# Patient Record
Sex: Male | Born: 1970
Health system: Southern US, Community
[De-identification: ages and names within clinical notes are randomized; demographics above are authoritative.]

## PROBLEM LIST (undated history)

## (undated) DIAGNOSIS — E785 Hyperlipidemia, unspecified: Secondary | ICD-10-CM

## (undated) DIAGNOSIS — Z9889 Other specified postprocedural states: Secondary | ICD-10-CM

## (undated) DIAGNOSIS — K219 Gastro-esophageal reflux disease without esophagitis: Secondary | ICD-10-CM

## (undated) DIAGNOSIS — I2699 Other pulmonary embolism without acute cor pulmonale: Secondary | ICD-10-CM

## (undated) DIAGNOSIS — R112 Nausea with vomiting, unspecified: Secondary | ICD-10-CM

## (undated) DIAGNOSIS — N529 Male erectile dysfunction, unspecified: Secondary | ICD-10-CM

## (undated) HISTORY — PX: SHOULDER SURGERY: SHX246

## (undated) HISTORY — PX: BACK SURGERY: SHX140

## (undated) HISTORY — PX: SPINE SURGERY: SHX786

## (undated) HISTORY — PX: OTHER SURGICAL HISTORY: SHX169

## (undated) HISTORY — DX: Gastro-esophageal reflux disease without esophagitis: K21.9

---

## 2004-10-03 ENCOUNTER — Emergency Department (HOSPITAL_COMMUNITY): Admission: EM | Admit: 2004-10-03 | Discharge: 2004-10-03 | Payer: Self-pay | Admitting: Emergency Medicine

## 2004-10-10 ENCOUNTER — Ambulatory Visit (HOSPITAL_COMMUNITY): Admission: RE | Admit: 2004-10-10 | Discharge: 2004-10-10 | Payer: Self-pay | Admitting: Orthopedic Surgery

## 2007-09-23 ENCOUNTER — Emergency Department (HOSPITAL_COMMUNITY): Admission: EM | Admit: 2007-09-23 | Discharge: 2007-09-23 | Payer: Self-pay | Admitting: Emergency Medicine

## 2008-11-21 ENCOUNTER — Observation Stay (HOSPITAL_COMMUNITY): Admission: EM | Admit: 2008-11-21 | Discharge: 2008-11-22 | Payer: Self-pay | Admitting: Emergency Medicine

## 2010-07-07 LAB — CBC
HCT: 42.6 % (ref 39.0–52.0)
Hemoglobin: 15.1 g/dL (ref 13.0–17.0)
Platelets: 199 10*3/uL (ref 150–400)

## 2010-07-07 LAB — RAPID URINE DRUG SCREEN, HOSP PERFORMED
Amphetamines: NOT DETECTED
Barbiturates: NOT DETECTED
Benzodiazepines: NOT DETECTED
Cocaine: NOT DETECTED
Opiates: NOT DETECTED
Tetrahydrocannabinol: NOT DETECTED

## 2010-07-07 LAB — BASIC METABOLIC PANEL
CO2: 22 mEq/L (ref 19–32)
Calcium: 8.5 mg/dL (ref 8.4–10.5)
Chloride: 107 mEq/L (ref 96–112)
Creatinine, Ser: 0.95 mg/dL (ref 0.4–1.5)
GFR calc Af Amer: >60 mL/min (ref >60)
Glucose, Bld: 126 mg/dL — ABNORMAL HIGH (ref 70–99)
Potassium: 3.5 mEq/L (ref 3.5–5.1)

## 2010-07-07 LAB — URINALYSIS, ROUTINE W REFLEX MICROSCOPIC
Glucose, UA: NEGATIVE mg/dL
Ketones, ur: NEGATIVE mg/dL
pH: 7 (ref 5.0–8.0)

## 2010-07-07 LAB — URINE MICROSCOPIC-ADD ON
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
## 2012-09-28 ENCOUNTER — Encounter: Payer: Self-pay | Admitting: Emergency Medicine

## 2012-09-28 ENCOUNTER — Emergency Department (INDEPENDENT_AMBULATORY_CARE_PROVIDER_SITE_OTHER): Payer: 59

## 2012-09-28 ENCOUNTER — Emergency Department
Admission: EM | Admit: 2012-09-28 | Discharge: 2012-09-28 | Disposition: A | Discharge status: Home / Self Care | Payer: Self-pay | Source: Home / Self Care | Attending: Family Medicine | Admitting: Family Medicine

## 2012-09-28 DIAGNOSIS — R109 Unspecified abdominal pain: Secondary | ICD-10-CM

## 2012-09-28 DIAGNOSIS — R1030 Lower abdominal pain, unspecified: Secondary | ICD-10-CM

## 2012-09-28 DIAGNOSIS — S7000XA Contusion of unspecified hip, initial encounter: Secondary | ICD-10-CM

## 2012-09-28 DIAGNOSIS — M79609 Pain in unspecified limb: Secondary | ICD-10-CM
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
## 2013-09-12 ENCOUNTER — Encounter: Payer: Self-pay | Admitting: Emergency Medicine

## 2013-09-12 ENCOUNTER — Emergency Department (INDEPENDENT_AMBULATORY_CARE_PROVIDER_SITE_OTHER): Payer: 59

## 2013-09-12 ENCOUNTER — Emergency Department
Admission: EM | Admit: 2013-09-12 | Discharge: 2013-09-12 | Disposition: A | Discharge status: Home / Self Care | Payer: 59 | Source: Home / Self Care | Attending: Emergency Medicine | Admitting: Emergency Medicine

## 2013-09-12 ENCOUNTER — Emergency Department: Payer: 59

## 2013-09-12 DIAGNOSIS — S92109A Unspecified fracture of unspecified talus, initial encounter for closed fracture: Secondary | ICD-10-CM

## 2013-09-12 DIAGNOSIS — R609 Edema, unspecified: Secondary | ICD-10-CM

## 2013-09-12 DIAGNOSIS — M25579 Pain in unspecified ankle and joints of unspecified foot: Secondary | ICD-10-CM

## 2013-09-12 DIAGNOSIS — S92152A Displaced avulsion fracture (chip fracture) of left talus, initial encounter for closed fracture: Secondary | ICD-10-CM

## 2013-09-12 MED ORDER — HYDROCODONE-ACETAMINOPHEN 5-325 MG PO TABS: 1.0000 | ORAL_TABLET | ORAL | Status: DC | PRN

## 2013-09-12 MED ORDER — IBUPROFEN 200 MG PO TABS: ORAL_TABLET | ORAL | Status: DC
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
## 2013-09-14 ENCOUNTER — Encounter: Payer: Self-pay | Admitting: Sports Medicine

## 2013-09-14 ENCOUNTER — Ambulatory Visit (INDEPENDENT_AMBULATORY_CARE_PROVIDER_SITE_OTHER): Payer: 59 | Admitting: Sports Medicine

## 2013-09-14 VITALS — BP 137/78 | HR 61 | Ht 70.0 in | Wt 171.0 lb

## 2013-09-14 DIAGNOSIS — Z299 Encounter for prophylactic measures, unspecified: Secondary | ICD-10-CM

## 2013-09-14 DIAGNOSIS — M25572 Pain in left ankle and joints of left foot: Secondary | ICD-10-CM | POA: Insufficient documentation

## 2013-09-14 DIAGNOSIS — N139 Obstructive and reflux uropathy, unspecified: Secondary | ICD-10-CM | POA: Insufficient documentation

## 2013-09-14 DIAGNOSIS — S92102A Unspecified fracture of left talus, initial encounter for closed fracture: Secondary | ICD-10-CM

## 2013-09-14 DIAGNOSIS — S92109A Unspecified fracture of unspecified talus, initial encounter for closed fracture: Secondary | ICD-10-CM

## 2013-09-14 DIAGNOSIS — Z0000 Encounter for general adult medical examination without abnormal findings: Secondary | ICD-10-CM | POA: Insufficient documentation

## 2013-09-14 MED ORDER — TAMSULOSIN HCL 0.4 MG PO CAPS: 0.4000 mg | ORAL_CAPSULE | Freq: Every day | ORAL | Status: DC

## 2013-09-14 MED ORDER — HYDROCODONE-ACETAMINOPHEN 5-325 MG PO TABS: 1.0000 | ORAL_TABLET | Freq: Three times a day (TID) | ORAL | Status: DC | PRN

## 2013-09-14 NOTE — Assessment & Plan Note (Addendum)
With hemarthrosis. Continue boot, return in one week for a solid cast. Hydrocodone for pain.  I billed a fracture code for this visit, all subsequent visits for this complaint will be "post-op checks" in the global period.

## 2013-09-14 NOTE — Assessment & Plan Note (Signed)
Patient will return for complete physical, ordering fasting blood work.
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
## 2013-09-14 NOTE — Assessment & Plan Note (Signed)
Flomax at bedtime.

## 2013-09-21 ENCOUNTER — Encounter: Payer: Self-pay | Admitting: Sports Medicine

## 2013-09-21 ENCOUNTER — Ambulatory Visit (INDEPENDENT_AMBULATORY_CARE_PROVIDER_SITE_OTHER): Payer: 59 | Admitting: Sports Medicine

## 2013-09-21 VITALS — BP 129/85 | HR 65 | Ht 70.0 in | Wt 170.0 lb

## 2013-09-21 DIAGNOSIS — S92102D Unspecified fracture of left talus, subsequent encounter for fracture with routine healing: Secondary | ICD-10-CM

## 2013-09-21 DIAGNOSIS — S93332A Other subluxation of left foot, initial encounter: Secondary | ICD-10-CM | POA: Insufficient documentation

## 2013-09-21 DIAGNOSIS — S9306XA Dislocation of unspecified ankle joint, initial encounter: Secondary | ICD-10-CM

## 2013-09-21 DIAGNOSIS — S9305XA Dislocation of left ankle joint, initial encounter: Secondary | ICD-10-CM | POA: Insufficient documentation

## 2013-09-21 DIAGNOSIS — IMO0001 Reserved for inherently not codable concepts without codable children: Secondary | ICD-10-CM

## 2013-09-21 NOTE — Assessment & Plan Note (Signed)
Cast immobilization for 4 weeks. 

## 2013-09-21 NOTE — Progress Notes (Signed)
  Subjective:    CC: Cast placement  HPI: Fracture left talus: Overall doing significantly better in a cast boot, he's here for traditional cast placement. He has noted some popping around his lateral malleolus. He did some research and thinks this is peroneal tendon subluxation, I agree. Pain is mild, improving. He also has some FMLA forms that he would like filled out.  Past medical history, Surgical history, Family history not pertinant except as noted below, Social history, Allergies, and medications have been entered into the medical record, reviewed, and no changes needed.   Review of Systems: No fevers, chills, night sweats, weight loss, chest pain, or shortness of breath.   Objective:    General: Well Developed, well nourished, and in no acute distress.  Neuro: Alert and oriented x3, extra-ocular muscles intact, sensation grossly intact.  HEENT: Normocephalic, atraumatic, pupils equal round reactive to light, neck supple, no masses, no lymphadenopathy, thyroid nonpalpable.  Skin: Warm and dry, no rashes. Cardiac: Regular rate and rhythm, no murmurs rubs or gallops, no lower extremity edema.  Respiratory: Clear to auscultation bilaterally. Not using accessory muscles, speaking in full sentences. Left foot: Swollen, mild fluid wave, tender to palpation over the peroneals. I am unable to reproduce subluxation.  Short leg cast placed  FMLA paperwork filled out.  Impression and Recommendations:   I spent 40 minutes with this patient, greater than 50% was face-to-face time counseling regarding the above diagnoses and filling out paperwork.

## 2013-09-21 NOTE — Assessment & Plan Note (Signed)
Cast immobilization for 4 weeks.

## 2013-09-25 LAB — COMPREHENSIVE METABOLIC PANEL WITH GFR
ALT: 25 U/L (ref 0–53)
AST: 19 U/L (ref 0–37)
Albumin: 4.1 g/dL (ref 3.5–5.2)
Alkaline Phosphatase: 48 U/L (ref 39–117)
CO2: 26 meq/L (ref 19–32)
Calcium: 8.9 mg/dL (ref 8.4–10.5)
Glucose, Bld: 87 mg/dL (ref 70–99)
Sodium: 140 meq/L (ref 135–145)
Total Bilirubin: 0.6 mg/dL (ref 0.2–1.2)
Total Protein: 6.8 g/dL (ref 6.0–8.3)

## 2013-09-25 LAB — LIPID PANEL
Cholesterol: 204 mg/dL — ABNORMAL HIGH (ref 0–200)
HDL: 56 mg/dL (ref >39)
LDL Cholesterol: 134 mg/dL — ABNORMAL HIGH (ref 0–99)
Total CHOL/HDL Ratio: 3.6 Ratio
Triglycerides: 69 mg/dL (ref <150)
VLDL: 14 mg/dL (ref 0–40)

## 2013-09-25 LAB — CBC
HCT: 43 % (ref 39.0–52.0)
Hemoglobin: 15 g/dL (ref 13.0–17.0)
MCH: 32.1 pg (ref 26.0–34.0)
MCHC: 34.9 g/dL (ref 30.0–36.0)
MCV: 92.1 fL (ref 78.0–100.0)
Platelets: 230 K/uL (ref 150–400)
RBC: 4.67 MIL/uL (ref 4.22–5.81)
RDW: 14 % (ref 11.5–15.5)
WBC: 6.2 K/uL (ref 4.0–10.5)

## 2013-09-25 LAB — COMPREHENSIVE METABOLIC PANEL
BUN: 16 mg/dL (ref 6–23)
Chloride: 104 mEq/L (ref 96–112)
Creat: 0.84 mg/dL (ref 0.50–1.35)
Potassium: 4.4 mEq/L (ref 3.5–5.3)

## 2013-09-25 LAB — HEMOGLOBIN A1C
Hgb A1c MFr Bld: 5.5 % (ref <5.7)
Mean Plasma Glucose: 111 mg/dL (ref <117)

## 2013-09-25 LAB — TSH: TSH: 2.406 u[IU]/mL (ref 0.350–4.500)

## 2013-09-27 NOTE — Progress Notes (Signed)
Patient informed with understanding. SI

## 2013-09-28 ENCOUNTER — Ambulatory Visit (INDEPENDENT_AMBULATORY_CARE_PROVIDER_SITE_OTHER): Payer: 59 | Admitting: Sports Medicine

## 2013-09-28 ENCOUNTER — Encounter: Payer: Self-pay | Admitting: Sports Medicine

## 2013-09-28 VITALS — BP 121/79 | HR 68 | Ht 70.0 in | Wt 170.0 lb

## 2013-09-28 DIAGNOSIS — Z23 Encounter for immunization: Secondary | ICD-10-CM

## 2013-09-28 DIAGNOSIS — R413 Other amnesia: Secondary | ICD-10-CM | POA: Insufficient documentation

## 2013-09-28 DIAGNOSIS — IMO0001 Reserved for inherently not codable concepts without codable children: Secondary | ICD-10-CM

## 2013-09-28 DIAGNOSIS — S92102D Unspecified fracture of left talus, subsequent encounter for fracture with routine healing: Secondary | ICD-10-CM

## 2013-09-28 DIAGNOSIS — Z299 Encounter for prophylactic measures, unspecified: Secondary | ICD-10-CM

## 2013-09-28 DIAGNOSIS — N139 Obstructive and reflux uropathy, unspecified: Secondary | ICD-10-CM

## 2013-09-28 LAB — CBC
HCT: 43.8 % (ref 39.0–52.0)
Hemoglobin: 15.3 g/dL (ref 13.0–17.0)
MCH: 32.2 pg (ref 26.0–34.0)
MCHC: 34.9 g/dL (ref 30.0–36.0)
MCV: 92.2 fL (ref 78.0–100.0)
Platelets: 224 10*3/uL (ref 150–400)
RBC: 4.75 MIL/uL (ref 4.22–5.81)
RDW: 14.3 % (ref 11.5–15.5)
WBC: 6.1 K/uL (ref 4.0–10.5)

## 2013-09-28 LAB — FOLATE: Folate: 13.1 ng/mL

## 2013-09-28 LAB — SEDIMENTATION RATE: Sed Rate: 1 mm/h (ref 0–16)

## 2013-09-28 LAB — VITAMIN B12: Vitamin B-12: 784 pg/mL (ref 211–911)

## 2013-09-28 LAB — TSH: TSH: 2.251 u[IU]/mL (ref 0.350–4.500)

## 2013-09-28 NOTE — Assessment & Plan Note (Signed)
Double Flomax to 0.8 mg.

## 2013-09-28 NOTE — Assessment & Plan Note (Signed)
Labs look good, Tdap.

## 2013-09-28 NOTE — Assessment & Plan Note (Signed)
Continue cast immobilization for an additional 2 weeks.

## 2013-09-28 NOTE — Progress Notes (Signed)
  Subjective:    CC: Followup  HPI: Obstructive uropathy: Mild improvement of 0.4 mg of Flomax, amenable to increasing to 0.8 mg.  Poor memory: Family history of Alzheimer's disease, he will occasionally forget things but it does not seem to interfere with his daily personal or professional life. He denies any depressive symptoms. No incontinence, no abnormalities of gait.  Left talus fracture with peroneal tendon subluxation: Has another couple of weeks in a cast. Already feeling significantly better.  Past medical history, Surgical history, Family history not pertinant except as noted below, Social history, Allergies, and medications have been entered into the medical record, reviewed, and no changes needed.   Review of Systems: No fevers, chills, night sweats, weight loss, chest pain, or shortness of breath.   Objective:    General: Well Developed, well nourished, and in no acute distress.  Neuro: Alert and oriented x3, extra-ocular muscles intact, sensation grossly intact.  HEENT: Normocephalic, atraumatic, pupils equal round reactive to light, neck supple, no masses, no lymphadenopathy, thyroid nonpalpable.  Skin: Warm and dry, no rashes. Cardiac: Regular rate and rhythm, no murmurs rubs or gallops, no lower extremity edema.  Respiratory: Clear to auscultation bilaterally. Not using accessory muscles, speaking in full sentences. Left foot: Cast is in good shape.  Impression and Recommendations:

## 2013-09-28 NOTE — Assessment & Plan Note (Addendum)
Family history of Alzheimer's. PHQ9 = 2. Checking a dementia panel. We did discuss the possibility of him seeing a psychologist for consideration of adult-onset ADHD. He will let us know.

## 2013-09-29 LAB — RPR

## 2013-10-07 ENCOUNTER — Ambulatory Visit (INDEPENDENT_AMBULATORY_CARE_PROVIDER_SITE_OTHER): Payer: 59 | Admitting: Sports Medicine

## 2013-10-07 ENCOUNTER — Encounter: Payer: Self-pay | Admitting: Sports Medicine

## 2013-10-07 VITALS — BP 137/73 | HR 81 | Ht 70.0 in | Wt 172.0 lb

## 2013-10-07 DIAGNOSIS — S92102D Unspecified fracture of left talus, subsequent encounter for fracture with routine healing: Secondary | ICD-10-CM

## 2013-10-07 DIAGNOSIS — R413 Other amnesia: Secondary | ICD-10-CM

## 2013-10-07 DIAGNOSIS — N139 Obstructive and reflux uropathy, unspecified: Secondary | ICD-10-CM

## 2013-10-07 DIAGNOSIS — IMO0001 Reserved for inherently not codable concepts without codable children: Secondary | ICD-10-CM

## 2013-10-07 DIAGNOSIS — S9305XD Dislocation of left ankle joint, subsequent encounter: Secondary | ICD-10-CM

## 2013-10-07 NOTE — Assessment & Plan Note (Signed)
Doing well, continue cast boot for an additional week then transition into a lace up ASO. Return in 3 weeks.

## 2013-10-07 NOTE — Assessment & Plan Note (Signed)
Doing well, continue cast boot for an additional week then transition into a lace up ASO. Return in 3 weeks. 

## 2013-10-07 NOTE — Progress Notes (Signed)
  Subjective:    CC: Followup  HPI: Fracture of left talus, peritoneal tendon subluxation: Jrue has now been in a short-leg cast for 3 weeks. He removed the cast himself. He feels good, and is pain-free. No limitations.  Obstructive uropathy: Improved significantly on 0.8 mg of Flomax.  Poor memory: Has decided not to proceed with psychology referral for consideration of adult ADHD.  Past medical history, Surgical history, Family history not pertinant except as noted below, Social history, Allergies, and medications have been entered into the medical record, reviewed, and no changes needed.   Review of Systems: No fevers, chills, night sweats, weight loss, chest pain, or shortness of breath.   Objective:    General: Well Developed, well nourished, and in no acute distress.  Neuro: Alert and oriented x3, extra-ocular muscles intact, sensation grossly intact.  HEENT: Normocephalic, atraumatic, pupils equal round reactive to light, neck supple, no masses, no lymphadenopathy, thyroid nonpalpable.  Skin: Warm and dry, no rashes. Cardiac: Regular rate and rhythm, no murmurs rubs or gallops, no lower extremity edema.  Respiratory: Clear to auscultation bilaterally. Not using accessory muscles, speaking in full sentences. Left Ankle: No visible erythema or swelling. Range of motion is full in all directions. Strength is 5/5 in all directions. Stable lateral and medial ligaments; squeeze test and kleiger test unremarkable; Talar dome nontender; No pain at base of 5th MT; No tenderness over cuboid; No tenderness over N spot or navicular prominence No tenderness on posterior aspects of lateral and medial malleolus No sign of peroneal tendon subluxations; Negative tarsal tunnel tinel's Able to walk 4 steps.  Impression and Recommendations:

## 2013-10-07 NOTE — Progress Notes (Deleted)

## 2013-10-07 NOTE — Assessment & Plan Note (Signed)
Improved significantly with Flomax 0.8 mg.

## 2013-10-07 NOTE — Assessment & Plan Note (Signed)
Negative dementia workup, her symptoms are mild. Declines psychologist workup for adult-onset ADHD.

## 2013-10-29 ENCOUNTER — Ambulatory Visit: Payer: 59 | Admitting: Sports Medicine

## 2013-11-08 ENCOUNTER — Ambulatory Visit (INDEPENDENT_AMBULATORY_CARE_PROVIDER_SITE_OTHER): Payer: 59 | Admitting: Sports Medicine

## 2013-11-08 ENCOUNTER — Encounter: Payer: Self-pay | Admitting: Sports Medicine

## 2013-11-08 VITALS — BP 129/76 | HR 66 | Ht 70.0 in | Wt 171.0 lb

## 2013-11-08 DIAGNOSIS — S9305XD Dislocation of left ankle joint, subsequent encounter: Secondary | ICD-10-CM

## 2013-11-08 DIAGNOSIS — M25579 Pain in unspecified ankle and joints of unspecified foot: Secondary | ICD-10-CM

## 2013-11-08 DIAGNOSIS — IMO0001 Reserved for inherently not codable concepts without codable children: Secondary | ICD-10-CM

## 2013-11-08 DIAGNOSIS — M25572 Pain in left ankle and joints of left foot: Secondary | ICD-10-CM

## 2013-11-08 NOTE — Assessment & Plan Note (Signed)
Pain is now referral to the tibiotalar joint 2 months after fracture, fracture is likely healed, he continues to have swelling and pain referable to the joint. Injection as above. Return in one month.

## 2013-11-08 NOTE — Assessment & Plan Note (Signed)
Mechanical subluxation has essentially resolved and continues to improve day by day.

## 2013-11-08 NOTE — Progress Notes (Signed)
  Subjective:    CC: Followup  HPI: Talus fracture: Clinically resolved. Unfortunately he continues to have some pain with swelling at the talocrural joint but no pain at the fracture site. He also had some perineal tendon subluxation which we treated with a cast, and these improved significantly. Pain is moderate, persistent located anteriorly over the talocrural joint.  Past medical history, Surgical history, Family history not pertinant except as noted below, Social history, Allergies, and medications have been entered into the medical record, reviewed, and no changes needed.   Review of Systems: No fevers, chills, night sweats, weight loss, chest pain, or shortness of breath.   Objective:    General: Well Developed, well nourished, and in no acute distress.  Neuro: Alert and oriented x3, extra-ocular muscles intact, sensation grossly intact.  HEENT: Normocephalic, atraumatic, pupils equal round reactive to light, neck supple, no masses, no lymphadenopathy, thyroid nonpalpable.  Skin: Warm and dry, no rashes. Cardiac: Regular rate and rhythm, no murmurs rubs or gallops, no lower extremity edema.  Respiratory: Clear to auscultation bilaterally. Not using accessory muscles, speaking in full sentences. Left Ankle: Visibly swollen and tender to palpation anteriorly over the talocrural joint, no tenderness to palpation over the fracture site. Range of motion is full in all directions. Strength is 5/5 in all directions. Stable lateral and medial ligaments; squeeze test and kleiger test unremarkable; Talar dome nontender; No pain at base of 5th MT; No tenderness over cuboid; No tenderness over N spot or navicular prominence No tenderness on posterior aspects of lateral and medial malleolus I am unable to reproduce any of the peroneal tendon subluxation. Negative tarsal tunnel tinel's Able to walk 4 steps.  Procedure: Real-time Ultrasound Guided Injection of left talocrural joint Device:  GE Logiq E  Verbal informed consent obtained.  Time-out conducted.  Noted no overlying erythema, induration, or other signs of local infection.  Skin prepped in a sterile fashion.  Local anesthesia: Topical Ethyl chloride.  With sterile technique and under real time ultrasound guidance:  Needle advanced into ankle joint, 1 cc kenalog 40, 3 cc lidocaine injected easily. Completed without difficulty  Pain immediately resolved suggesting accurate placement of the medication.  Advised to call if fevers/chills, erythema, induration, drainage, or persistent bleeding.  Images permanently stored and available for review in the ultrasound unit.  Impression: Technically successful ultrasound guided injection.  Impression and Recommendations:

## 2013-12-09 ENCOUNTER — Encounter: Payer: Self-pay | Admitting: Sports Medicine

## 2013-12-09 ENCOUNTER — Ambulatory Visit (INDEPENDENT_AMBULATORY_CARE_PROVIDER_SITE_OTHER): Payer: 59 | Admitting: Sports Medicine

## 2013-12-09 VITALS — BP 138/81 | HR 70 | Ht 70.0 in | Wt 169.0 lb

## 2013-12-09 DIAGNOSIS — M25572 Pain in left ankle and joints of left foot: Secondary | ICD-10-CM

## 2013-12-09 DIAGNOSIS — M25579 Pain in unspecified ankle and joints of unspecified foot: Secondary | ICD-10-CM

## 2013-12-09 DIAGNOSIS — IMO0001 Reserved for inherently not codable concepts without codable children: Secondary | ICD-10-CM

## 2013-12-09 DIAGNOSIS — S9306XA Dislocation of unspecified ankle joint, initial encounter: Secondary | ICD-10-CM

## 2013-12-09 DIAGNOSIS — S9305XD Dislocation of left ankle joint, subsequent encounter: Secondary | ICD-10-CM

## 2013-12-09 NOTE — Assessment & Plan Note (Signed)
Completely resolved after talocrural joint injection. Advised to wear an ankle sleeve for the next several months but otherwise no restrictions.

## 2013-12-09 NOTE — Assessment & Plan Note (Signed)
Completely resolved after immobilization and rehabilitation.

## 2013-12-09 NOTE — Progress Notes (Signed)
Patient ID: Sean Serrano, male   DOB: Aug 06, 1970, 43 y.o.   MRN: [id number]  Subjective:    CC: F/U left peroneal tendon subluxation, F/U left talus fracture  HPI: Sean Serrano is a very pleasant 43 year old man who presents for follow-up of left peroneal tendon subluxation and left talus fracture. When seen in our office one month ago (8/10), he had pain referred to the tibiotalar joint 2 months after his initial fracture. As he was planning a mountain biking trip, injection of the joint was done. He reports complete resolution of his pain after injection. Has also had several months of left peroneal tendon subluxation, but reports significant improvement with no sensation of popping or pain over the past month. He does continue to have some slight discomfort with eversion and dorsiflexion of the foot.  Past medical history, Surgical history, Family history not pertinant except as noted below, Social history, Allergies, and medications have been entered into the medical record, reviewed, and no changes needed.   Review of Systems: No fevers, chills, night sweats, weight loss, chest pain, or shortness of breath.   Objective:    General: Well developed, well nourished, and in no acute distress.  Neuro: Alert and oriented x3, extra-ocular muscles intact, sensation grossly intact.  HEENT: Normocephalic, atraumatic, pupils equal round reactive to light, neck supple, no masses, no lymphadenopathy, thyroid nonpalpable.  Skin: Warm and dry, no rashes. Cardiac: Regular rate and rhythm, no murmurs rubs or gallops, no lower extremity edema.  Respiratory: Clear to auscultation bilaterally. Not using accessory muscles, speaking in full sentences.  Left Ankle: No visible erythema. Some swelling over the lateral ankle. Range of motion is full in all directions. Strength is 5/5 in all directions. Stable lateral and medial ligaments; squeeze test and kleiger test unremarkable; Talar dome nontender; No  pain at base of 5th MT; No tenderness over cuboid; No tenderness over N spot or navicular prominence. No tenderness on posterior aspects of lateral and medial malleolus. No sign of peroneal tendon subluxations or tenderness to palpation. Negative tarsal tunnel tinel's. Able to walk 4 steps.  Impression and Recommendations:   Left peroneal tendon subluxation: Completely resolved after immobilization and rehabilitation.  Left talus fracture: Completely resolved after talocrural joint injection. - Ankle sleeve for the next several months while exercising/mountain biking.

## 2014-01-14 ENCOUNTER — Other Ambulatory Visit: Payer: Self-pay

## 2014-05-17 ENCOUNTER — Encounter: Payer: Self-pay | Admitting: Sports Medicine

## 2014-05-17 ENCOUNTER — Ambulatory Visit (INDEPENDENT_AMBULATORY_CARE_PROVIDER_SITE_OTHER): Payer: 59 | Admitting: Sports Medicine

## 2014-05-17 VITALS — BP 133/79 | HR 77 | Temp 99.8°F | Ht 69.5 in | Wt 178.0 lb

## 2014-05-17 DIAGNOSIS — B029 Zoster without complications: Secondary | ICD-10-CM

## 2014-05-17 MED ORDER — PREGABALIN 75 MG PO CAPS: 75.0000 mg | ORAL_CAPSULE | Freq: Two times a day (BID) | ORAL | Status: DC

## 2014-05-17 NOTE — Patient Instructions (Signed)

## 2014-05-17 NOTE — Progress Notes (Signed)
  Subjective:    CC: skin rash  HPI: For the past week this pleasant 44 year old male has had fevers, chills, and more recently a rash described as burning and itching from the left side of his back, to the anterior thigh, medial aspect of the knee in the medial aspect of the lower leg. Moderate, persistent.  Past medical history, Surgical history, Family history not pertinant except as noted below, Social history, Allergies, and medications have been entered into the medical record, reviewed, and no changes needed.   Review of Systems: No fevers, chills, night sweats, weight loss, chest pain, or shortness of breath.   Objective:    General: Well Developed, well nourished, and in no acute distress.  Neuro: Alert and oriented x3, extra-ocular muscles intact, sensation grossly intact.  HEENT: Normocephalic, atraumatic, pupils equal round reactive to light, neck supple, no masses, no lymphadenopathy, thyroid nonpalpable.  Skin: Warm and dry, there is a raised, erythematous, maculopapular type rash from the back on the left side, down the left lower extremity in an L4 type distribution. Cardiac: Regular rate and rhythm, no murmurs rubs or gallops, no lower extremity edema.  Respiratory: Clear to auscultation bilaterally. Not using accessory muscles, speaking in full sentences.  Impression and Recommendations:

## 2014-05-17 NOTE — Assessment & Plan Note (Signed)
This is a very classic left L4 distribution shingles. He already has Valtrex 1000 mg  3 times a day for a week. I am going to add Lyrica for pain relief.  Return in 2 weeks.

## 2014-05-31 ENCOUNTER — Ambulatory Visit (INDEPENDENT_AMBULATORY_CARE_PROVIDER_SITE_OTHER): Payer: 59 | Admitting: Sports Medicine

## 2014-05-31 ENCOUNTER — Encounter: Payer: Self-pay | Admitting: Sports Medicine

## 2014-05-31 DIAGNOSIS — B029 Zoster without complications: Secondary | ICD-10-CM

## 2014-05-31 MED ORDER — VALACYCLOVIR HCL 1 G PO TABS: 1000.0000 mg | ORAL_TABLET | Freq: Two times a day (BID) | ORAL | Status: DC

## 2014-05-31 MED ORDER — PREGABALIN 75 MG PO CAPS: 75.0000 mg | ORAL_CAPSULE | Freq: Two times a day (BID) | ORAL | Status: DC

## 2014-05-31 NOTE — Patient Instructions (Signed)
Shingles Vaccine What You Need to Know WHAT IS SHINGLES?  Shingles is a painful skin rash, often with blisters. It is also called Herpes Zoster or just Zoster.  A shingles rash usually appears on one side of the face or body and lasts from 2 to 4 weeks. Its main symptom is pain, which can be quite severe. Other symptoms of shingles can include fever, headache, chills, and upset stomach. Very rarely, a shingles infection can lead to pneumonia, hearing problems, blindness, brain inflammation (encephalitis), or death.  For about 1 person in 5, severe pain can continue even after the rash clears up. This is called post-herpetic neuralgia.  Shingles is caused by the Varicella Zoster virus. This is the same virus that causes chickenpox. Only someone who has had a case of chickenpox or rarely, has gotten chickenpox vaccine, can get shingles. The virus stays in your body. It can reappear many years later to cause a case of shingles.  You cannot catch shingles from another person with shingles. However, a person who has never had chickenpox (or chickenpox vaccine) could get chickenpox from someone with shingles. This is not very common.  Shingles is far more common in people 50 and older than in younger people. It is also more common in people whose immune systems are weakened because of a disease such as cancer or drugs such as steroids or chemotherapy.  At least 1 million people get shingles per year in the [place]. SHINGLES VACCINE  A vaccine for shingles was licensed in 2006. In clinical trials, the vaccine reduced the risk of shingles by 50%. It can also reduce the pain in people who still get shingles after being vaccinated.  A single dose of shingles vaccine is recommended for adults 60 years of age and older. SOME PEOPLE SHOULD NOT GET SHINGLES VACCINE OR SHOULD WAIT A person should not get shingles vaccine if he or she:  Has ever had a life-threatening allergic reaction to gelatin, the  antibiotic neomycin, or any other component of shingles vaccine. Tell your caregiver if you have any severe allergies.  Has a weakened immune system because of current:  AIDS or another disease that affects the immune system.  Treatment with drugs that affect the immune system, such as prolonged use of high-dose steroids.  Cancer treatment, such as radiation or chemotherapy.  Cancer affecting the bone marrow or lymphatic system, such as leukemia or lymphoma.  Is pregnant, or might be pregnant. Women should not become pregnant until at least 4 weeks after getting shingles vaccine. Someone with a minor illness, such as a cold, may be vaccinated. Anyone with a moderate or severe acute illness should usually wait until he or she recovers before getting the vaccine. This includes anyone with a temperature of 101.3 F (38 C) or higher. WHAT ARE THE RISKS FROM SHINGLES VACCINE?  A vaccine, like any medicine, could possibly cause serious problems, such as severe allergic reactions. However, the risk of a vaccine causing serious harm, or death, is extremely small.  No serious problems have been identified with shingles vaccine. Mild Problems  Redness, soreness, swelling, or itching at the site of the injection (about 1 person in 3).  Headache (about 1 person in 70). Like all vaccines, shingles vaccine is being closely monitored for unusual or severe problems. WHAT IF THERE IS A MODERATE OR SEVERE REACTION? What should I look for? Any unusual condition, such as a severe allergic reaction or a high fever. If a severe allergic reaction   occurred, it would be within a few minutes to an hour after the shot. Signs of a serious allergic reaction can include difficulty breathing, weakness, hoarseness or wheezing, a fast heartbeat, hives, dizziness, paleness, or swelling of the throat. What should I do?  Call your caregiver, or get the person to a caregiver right away.  Tell the caregiver what  happened, the date and time it happened, and when the vaccination was given.  Ask the caregiver to report the reaction by filing a Vaccine Adverse Event Reporting System (VAERS) form. Or, you can file this report through the VAERS web site at www.vaers.LAgents.no or by calling 1-4040801632. VAERS does not provide medical advice. HOW CAN I LEARN MORE?  Ask your caregiver. He or she can give you the vaccine package insert or suggest other sources of information.  Contact the Centers for Disease Control and Prevention (CDC):  Call 313-774-6844 (1-800-CDC-INFO).  Visit the CDC website at PicCapture.uy CDC Shingles Vaccine VIS (01/05/08) Document Released: 01/13/2006 Document Revised: 06/10/2011 Document Reviewed: 07/08/2012 [facility] Patient Information 2015 [place], [place]. This information is not intended to replace advice given to you by your health care provider. Make sure you discuss any questions you have with your health care provider.   Postherpetic Neuralgia Postherpetic neuralgia (PHN) is nerve pain that occurs after a shingles infection. Shingles is a painful rash that appears on one side of the body, usually on your trunk or face. Shingles is caused by the varicella-zoster virus. This is the same virus that causes chickenpox. In people who have had chickenpox, the virus can resurface years later and cause shingles. You may have PHN if you continue to have pain for 3 months after your shingles rash has gone away. PHN appears in the same area where you had the shingles rash. For most people, PHN goes away within 1 year.  Getting a vaccination for shingles can prevent PHN. This vaccine is recommended for people older than 50. It may prevent shingles and may also lower your risk of PHN if you do get shingles. CAUSES PHN is caused by damage to your nerves from the varicella-zoster virus. This damage makes your nerves overly sensitive.  RISK FACTORS Aging is the biggest risk factor  for developing PHN. Most people who get PHN are older than 60. Other risk factors include:  Having very bad pain before your shingles rash starts.  Having a very bad rash.  Having shingles in the nerve that supplies your face and eye (trigeminal nerve). SIGNS AND SYMPTOMS Pain is the main symptom of PHN. The pain is often very bad and may be described as stabbing, burning, or feeling like an electric shock. The pain may come and go or may be there all the time. Pain may be triggered by light touches on the skin or changes in temperature. You may have itching along with the pain. DIAGNOSIS  Your health care provider may diagnose PHN based on your symptoms and your history of shingles. Lab studies and other diagnostic tests are usually not needed. TREATMENT  There is no cure for PHN. Treatment for PHN will focus on pain relief. Over-the-counter pain relievers do not usually relieve PHN pain. You may need to work with a pain specialist. Treatment may include:  Antidepressant medicines to help with pain and improve sleep.  Antiseizure medicines to relieve nerve pain.  Strong pain relievers (opioids).  A numbing patch worn on the skin (lidocaine patch). HOME CARE INSTRUCTIONS It may take a long time to recover  from PHN. Work closely with your health care provider, and have a good support system at home.   Take all medicines as directed by your health care provider.  Wear loose, comfortable clothing.  Cover sensitive areas with a dressing to reduce friction from clothing rubbing on the area.  If cold does not make your pain worse, try applying a cool compress or cooling gel pack to the area.  Talk to your health care provider if you feel depressed or desperate. Living with long-term pain can be depressing. SEEK MEDICAL CARE IF:  Your medicine is not helping.  You are struggling to manage your pain at home. Document Released: 06/08/2002 Document Revised: 08/02/2013 Document Reviewed:  03/09/2013 [facility] Patient Information 2015 [place], [place]. This information is not intended to replace advice given to you by your health care provider. Make sure you discuss any questions you have with your health care provider.

## 2014-05-31 NOTE — Assessment & Plan Note (Signed)
Doing extremely well. Refilling Lyrica and Valtrex, he really doesn't need it anymore, but this is in case he gets another flare. He will not need vaccine until much later in life.

## 2014-05-31 NOTE — Progress Notes (Signed)
  Subjective:    CC: follow up shingles  HPI: Mr. Sean Serrano returns today to follow up on his outbreak of L4-distribution shingles. He reports that he is feeling much better, with signinficantly less pain and burning. He has only occasional intermittent light tingling in his leg. He finished his course of valacyclovir as prescribed and he has reduced his Lyrica use to once every few days and doubts he will need any more.   Past medical history, Surgical history, Family history not pertinant except as noted below, Social history, Allergies, and medications have been entered into the medical record, reviewed, and no changes needed.   Review of Systems: No fevers, chills, night sweats, weight loss, chest pain, or shortness of breath.   Objective:    General: Well Developed, well nourished, and in no acute distress.  Neuro: Alert and oriented x3, extra-ocular muscles intact, sensation grossly intact.  HEENT: Normocephalic, atraumatic, pupils equal round reactive to light, neck supple, no masses, no lymphadenopathy, thyroid nonpalpable.  Skin: Warm and dry, no rashes or new lesions present. Cardiac: Regular rate and rhythm, no murmurs rubs or gallops, no lower extremity edema.  Respiratory: Clear to auscultation bilaterally. Not using accessory muscles, speaking in full sentences.   Impression and Recommendations:    # Shingles - Patient's symptoms vastly improved - Discussed with patient that his risk of developing a further outbreak of shingles is no greater now for having this past outbreak. He may consider the VZV vaccine, but he will not need it until later in life. - Patient was given prescriptions for valtrex and instructed to begin taking it immediately at the first sign of a repeat outbreak in the future - Patient was given a refill prescription for lyrica to take as needed for his neuralgic symptoms as needed   Follow up as needed

## 2015-09-25 ENCOUNTER — Encounter: Payer: Self-pay | Admitting: Sports Medicine

## 2015-09-25 ENCOUNTER — Ambulatory Visit (INDEPENDENT_AMBULATORY_CARE_PROVIDER_SITE_OTHER): Payer: 59 | Admitting: Sports Medicine

## 2015-09-25 VITALS — BP 123/83 | HR 59 | Resp 18 | Ht 69.5 in | Wt 182.8 lb

## 2015-09-25 DIAGNOSIS — B079 Viral wart, unspecified: Secondary | ICD-10-CM | POA: Insufficient documentation

## 2015-09-25 DIAGNOSIS — L918 Other hypertrophic disorders of the skin: Secondary | ICD-10-CM | POA: Diagnosis not present

## 2015-09-25 DIAGNOSIS — Z299 Encounter for prophylactic measures, unspecified: Secondary | ICD-10-CM

## 2015-09-25 DIAGNOSIS — B078 Other viral warts: Secondary | ICD-10-CM | POA: Insufficient documentation

## 2015-09-25 DIAGNOSIS — S9305XD Dislocation of left ankle joint, subsequent encounter: Secondary | ICD-10-CM

## 2015-09-25 DIAGNOSIS — Z0000 Encounter for general adult medical examination without abnormal findings: Secondary | ICD-10-CM

## 2015-09-25 DIAGNOSIS — Z1211 Encounter for screening for malignant neoplasm of colon: Secondary | ICD-10-CM | POA: Diagnosis not present

## 2015-09-25 DIAGNOSIS — IMO0001 Reserved for inherently not codable concepts without codable children: Secondary | ICD-10-CM

## 2015-09-25 LAB — COMPREHENSIVE METABOLIC PANEL
ALT: 18 U/L (ref 9–46)
AST: 15 U/L (ref 10–40)
Albumin: 4.2 g/dL (ref 3.6–5.1)
Alkaline Phosphatase: 40 U/L (ref 40–115)
Calcium: 8.7 mg/dL (ref 8.6–10.3)
Chloride: 106 mmol/L (ref 98–110)
Creat: 0.88 mg/dL (ref 0.60–1.35)
Sodium: 140 mmol/L (ref 135–146)
Total Bilirubin: 0.4 mg/dL (ref 0.2–1.2)
Total Protein: 6.7 g/dL (ref 6.1–8.1)

## 2015-09-25 LAB — CBC
HCT: 43.3 % (ref 38.5–50.0)
Hemoglobin: 14.9 g/dL (ref 13.2–17.1)
MCH: 31.8 pg (ref 27.0–33.0)
MCHC: 34.4 g/dL (ref 32.0–36.0)
MCV: 92.5 fL (ref 80.0–100.0)
MPV: 10.6 fL (ref 7.5–12.5)
Platelets: 215 K/uL (ref 140–400)
RBC: 4.68 MIL/uL (ref 4.20–5.80)
RDW: 13.8 % (ref 11.0–15.0)
WBC: 4.9 10*3/uL (ref 3.8–10.8)

## 2015-09-25 LAB — LIPID PANEL
Cholesterol: 200 mg/dL (ref 125–200)
HDL: 61 mg/dL (ref >=40)
LDL Cholesterol: 122 mg/dL (ref <130)
Total CHOL/HDL Ratio: 3.3 ratio (ref <=5.0)
Triglycerides: 85 mg/dL (ref <150)
VLDL: 17 mg/dL (ref <30)

## 2015-09-25 LAB — COMPREHENSIVE METABOLIC PANEL WITH GFR
BUN: 19 mg/dL (ref 7–25)
CO2: 27 mmol/L (ref 20–31)
Glucose, Bld: 99 mg/dL (ref 65–99)
Potassium: 4.5 mmol/L (ref 3.5–5.3)

## 2015-09-25 LAB — HEMOGLOBIN A1C
Hgb A1c MFr Bld: 4.9 % (ref <5.7)
Mean Plasma Glucose: 94 mg/dL

## 2015-09-25 LAB — TSH: TSH: 2.2 m[IU]/L (ref 0.40–4.50)

## 2015-09-25 MED ORDER — MELOXICAM 15 MG PO TABS: ORAL_TABLET | ORAL | Status: DC

## 2015-09-25 NOTE — Assessment & Plan Note (Signed)
Physical exam as above. Checking routine blood work

## 2015-09-25 NOTE — Assessment & Plan Note (Signed)
Slight recurrence of pain, adding the peroneal tendon rehabilitation exercises, meloxicam, and he will return for custom orthotics. We can inject and immobilize if no better afterwards.

## 2015-09-25 NOTE — Assessment & Plan Note (Signed)
Cryotherapy as above. 
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
## 2015-09-26 LAB — HEMOCCULT GUIAC POC 1CARD (OFFICE): Fecal Occult Blood, POC: NEGATIVE

## 2015-09-26 LAB — VITAMIN D 25 HYDROXY (VIT D DEFICIENCY, FRACTURES): Vit D, 25-Hydroxy: 37 ng/mL (ref 30–100)

## 2015-09-26 LAB — HIV ANTIBODY (ROUTINE TESTING W REFLEX): HIV 1&2 Ab, 4th Generation: NONREACTIVE

## 2015-09-26 NOTE — Addendum Note (Signed)
Addended by: Baird Kay on: 09/26/2015 09:08 AM   Modules accepted: Orders

## 2015-09-27 ENCOUNTER — Ambulatory Visit (INDEPENDENT_AMBULATORY_CARE_PROVIDER_SITE_OTHER): Payer: 59 | Admitting: Sports Medicine

## 2015-09-27 VITALS — BP 135/76 | HR 55 | Resp 18

## 2015-09-27 DIAGNOSIS — S9305XD Dislocation of left ankle joint, subsequent encounter: Secondary | ICD-10-CM | POA: Diagnosis not present

## 2015-09-27 DIAGNOSIS — IMO0001 Reserved for inherently not codable concepts without codable children: Secondary | ICD-10-CM

## 2015-09-27 DIAGNOSIS — L918 Other hypertrophic disorders of the skin: Secondary | ICD-10-CM

## 2015-09-27 NOTE — Assessment & Plan Note (Signed)
Single repeat cryotherapy

## 2015-09-27 NOTE — Assessment & Plan Note (Signed)
New set of custom orthotics. Do the rehabilitation exercises. Continue meloxicam. Return in one month. Injection and immobilization if no better afterwards.

## 2015-09-27 NOTE — Progress Notes (Signed)

## 2015-11-01 ENCOUNTER — Ambulatory Visit (INDEPENDENT_AMBULATORY_CARE_PROVIDER_SITE_OTHER): Payer: 59 | Admitting: Sports Medicine

## 2015-11-01 DIAGNOSIS — B079 Viral wart, unspecified: Secondary | ICD-10-CM

## 2015-11-01 DIAGNOSIS — IMO0001 Reserved for inherently not codable concepts without codable children: Secondary | ICD-10-CM

## 2015-11-01 DIAGNOSIS — S9305XD Dislocation of left ankle joint, subsequent encounter: Secondary | ICD-10-CM

## 2015-11-01 DIAGNOSIS — B078 Other viral warts: Secondary | ICD-10-CM

## 2015-11-01 NOTE — Assessment & Plan Note (Signed)
Right fifth finger, cryotherapy as above.

## 2015-11-01 NOTE — Assessment & Plan Note (Signed)
Doing much better with custom orthotics, has not been compliant with exercises, he will get back to doing this.

## 2015-11-01 NOTE — Progress Notes (Signed)
  Subjective:    CC: Follow-up  HPI: This is a pleasant 45 year old male with left peroneal tendon subluxations. We held custom molded orthotics at the last visit he returns today feeling significantly better. He has not been very compliant with his rehabilitation exercises.  Finger lesion: Right fifth finger, old wart, desires cryotherapy. Skin tags have since fallen off.  Past medical history, Surgical history, Family history not pertinant except as noted below, Social history, Allergies, and medications have been entered into the medical record, reviewed, and no changes needed.   Review of Systems: No fevers, chills, night sweats, weight loss, chest pain, or shortness of breath.   Objective:    General: Well Developed, well nourished, and in no acute distress.  Neuro: Alert and oriented x3, extra-ocular muscles intact, sensation grossly intact.  HEENT: Normocephalic, atraumatic, pupils equal round reactive to light, neck supple, no masses, no lymphadenopathy, thyroid nonpalpable.  Skin: Warm and dry, no rashes. Cardiac: Regular rate and rhythm, no murmurs rubs or gallops, no lower extremity edema.  Respiratory: Clear to auscultation bilaterally. Not using accessory muscles, speaking in full sentences.  Procedure:  Cryodestruction of right fifth finger verruca Consent obtained and verified. Time-out conducted. Noted no overlying erythema, induration, or other signs of local infection. Completed without difficulty using Cryo-Gun. Advised to call if fevers/chills, erythema, induration, drainage, or persistent bleeding.   Impression and Recommendations:    Subluxation of peroneal tendon of left foot Doing much better with custom orthotics, has not been compliant with exercises, he will get back to doing this.   Verruca Right fifth finger, cryotherapy as above.

## 2016-06-15 ENCOUNTER — Emergency Department
Admission: EM | Admit: 2016-06-15 | Discharge: 2016-06-15 | Disposition: A | Discharge status: Home / Self Care | Payer: 59 | Source: Home / Self Care | Attending: Family Medicine | Admitting: Family Medicine

## 2016-06-15 ENCOUNTER — Encounter: Payer: Self-pay | Admitting: Emergency Medicine

## 2016-06-15 DIAGNOSIS — S81812A Laceration without foreign body, left lower leg, initial encounter: Secondary | ICD-10-CM

## 2016-06-15 MED ORDER — CEPHALEXIN 500 MG PO CAPS: 500.0000 mg | ORAL_CAPSULE | Freq: Two times a day (BID) | ORAL | 0 refills | Status: DC
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
## 2016-06-15 NOTE — Discharge Instructions (Signed)
Change dressing daily and apply Bacitracin ointment to wound.  Keep wound clean and dry.  Return for any signs of infection (or follow-up with family doctor):  Increasing redness, swelling, pain, heat, drainage, etc. Return in 14 days for suture removal.  Elevate leg as much as possible for several days and wear ace wrap until swelling resolves.

## 2016-06-17 ENCOUNTER — Telehealth: Payer: Self-pay

## 2016-06-17 NOTE — Telephone Encounter (Signed)
Pt stated that he was doing great.  Will follow up with PCP or UC as needed.

## 2016-07-15 DIAGNOSIS — R1110 Vomiting, unspecified: Secondary | ICD-10-CM | POA: Diagnosis not present

## 2016-07-15 DIAGNOSIS — B349 Viral infection, unspecified: Secondary | ICD-10-CM | POA: Diagnosis not present

## 2016-10-30 ENCOUNTER — Encounter: Payer: Self-pay | Admitting: Sports Medicine

## 2016-10-30 ENCOUNTER — Ambulatory Visit (INDEPENDENT_AMBULATORY_CARE_PROVIDER_SITE_OTHER): Payer: 59 | Admitting: Sports Medicine

## 2016-10-30 DIAGNOSIS — M7552 Bursitis of left shoulder: Secondary | ICD-10-CM

## 2016-10-30 DIAGNOSIS — Z0000 Encounter for general adult medical examination without abnormal findings: Secondary | ICD-10-CM | POA: Diagnosis not present

## 2016-10-30 LAB — CBC
HCT: 46.1 % (ref 38.5–50.0)
Hemoglobin: 15.9 g/dL (ref 13.2–17.1)
MCH: 32.2 pg (ref 27.0–33.0)
MCHC: 34.5 g/dL (ref 32.0–36.0)
MCV: 93.3 fL (ref 80.0–100.0)
MPV: 10 fL (ref 7.5–12.5)
Platelets: 229 10*3/uL (ref 140–400)
RBC: 4.94 MIL/uL (ref 4.20–5.80)
RDW: 13.9 % (ref 11.0–15.0)
WBC: 6 K/uL (ref 3.8–10.8)
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
## 2016-10-30 NOTE — Assessment & Plan Note (Signed)
Patient's going to return for a physical, adding routine blood work.  He is fasting today.

## 2016-10-30 NOTE — Assessment & Plan Note (Signed)
Injection as above, formal physical therapy. X-rays, he does have baseline acromioclavicular separation from decades ago post distal clavicular excision. Return to see me in a month.

## 2016-10-31 LAB — LIPID PANEL W/REFLEX DIRECT LDL
Cholesterol: 219 mg/dL — ABNORMAL HIGH (ref <200)
HDL: 62 mg/dL (ref >40)
LDL-Cholesterol: 138 mg/dL — ABNORMAL HIGH
Non-HDL Cholesterol (Calc): 157 mg/dL — ABNORMAL HIGH (ref <130)
Total CHOL/HDL Ratio: 3.5 ratio (ref <5.0)
Triglycerides: 87 mg/dL (ref <150)

## 2016-10-31 LAB — TSH: TSH: 2.36 m[IU]/L (ref 0.40–4.50)

## 2016-10-31 LAB — HEMOGLOBIN A1C
Hgb A1c MFr Bld: 5.1 % (ref <5.7)
Mean Plasma Glucose: 100 mg/dL

## 2016-10-31 LAB — COMPREHENSIVE METABOLIC PANEL
Alkaline Phosphatase: 50 U/L (ref 40–115)
BUN: 18 mg/dL (ref 7–25)
Calcium: 9 mg/dL (ref 8.6–10.3)
Creat: 1 mg/dL (ref 0.60–1.35)
Glucose, Bld: 79 mg/dL (ref 65–99)
Potassium: 4.5 mmol/L (ref 3.5–5.3)
Sodium: 139 mmol/L (ref 135–146)
Total Protein: 7 g/dL (ref 6.1–8.1)

## 2016-10-31 LAB — COMPREHENSIVE METABOLIC PANEL WITH GFR
ALT: 20 U/L (ref 9–46)
AST: 18 U/L (ref 10–40)
Albumin: 4.5 g/dL (ref 3.6–5.1)
CO2: 19 mmol/L — ABNORMAL LOW (ref 20–31)
Chloride: 105 mmol/L (ref 98–110)
Total Bilirubin: 0.7 mg/dL (ref 0.2–1.2)

## 2016-10-31 LAB — VITAMIN D 25 HYDROXY (VIT D DEFICIENCY, FRACTURES): Vit D, 25-Hydroxy: 41 ng/mL (ref 30–100)

## 2016-11-19 DIAGNOSIS — J069 Acute upper respiratory infection, unspecified: Secondary | ICD-10-CM | POA: Diagnosis not present

## 2016-12-03 ENCOUNTER — Ambulatory Visit (INDEPENDENT_AMBULATORY_CARE_PROVIDER_SITE_OTHER): Payer: 59 | Admitting: Sports Medicine

## 2016-12-03 ENCOUNTER — Encounter: Payer: Self-pay | Admitting: Sports Medicine

## 2016-12-03 VITALS — BP 127/81 | HR 80 | Wt 185.0 lb

## 2016-12-03 DIAGNOSIS — Z0000 Encounter for general adult medical examination without abnormal findings: Secondary | ICD-10-CM

## 2016-12-03 DIAGNOSIS — R5383 Other fatigue: Secondary | ICD-10-CM | POA: Diagnosis not present

## 2016-12-03 DIAGNOSIS — M7552 Bursitis of left shoulder: Secondary | ICD-10-CM

## 2016-12-03 DIAGNOSIS — N529 Male erectile dysfunction, unspecified: Secondary | ICD-10-CM | POA: Diagnosis not present

## 2016-12-03 DIAGNOSIS — E785 Hyperlipidemia, unspecified: Secondary | ICD-10-CM | POA: Insufficient documentation

## 2016-12-03 MED ORDER — SILDENAFIL CITRATE 20 MG PO TABS: 20.0000 mg | ORAL_TABLET | ORAL | 11 refills | Status: DC | PRN

## 2016-12-03 NOTE — Progress Notes (Signed)
  Subjective:    CC: Annual physical   HPI:  Sean Serrano is here for his physical, he has a few questions.  Fatigue: Feels as though fairly recently he developed a drastic decrease in his level of energy, his drive to have sex, separate from any fears of lack of performance, as well as difficulty putting on muscle in the gym. He would like me to check his testosterone levels.  Hyperlipidemia: Agreeable to try a low-cholesterol diet to begin with.  Erectile dysfunction: Does note that her erections are not as solid as when he was younger. Overall it takes longer to achieve a full erection, and even then it is not sufficiently full and duration is not as long.  Acute left shoulder bursitis: Injected at the last visit completely pain-free today.  Past medical history:  Negative.  See flowsheet/record as well for more information.  Surgical history: Negative.  See flowsheet/record as well for more information.  Family history: Negative.  See flowsheet/record as well for more information.  Social history: Negative.  See flowsheet/record as well for more information.  Allergies, and medications have been entered into the medical record, reviewed, and no changes needed.    Review of Systems: No headache, visual changes, nausea, vomiting, diarrhea, constipation, dizziness, abdominal pain, skin rash, fevers, chills, night sweats, swollen lymph nodes, weight loss, chest pain, body aches, joint swelling, muscle aches, shortness of breath, mood changes, visual or auditory hallucinations.  Objective:    General: Well Developed, well nourished, and in no acute distress.  Neuro: Alert and oriented x3, extra-ocular muscles intact, sensation grossly intact. Cranial nerves II through XII are intact, motor, sensory, and coordinative functions are all intact. HEENT: Normocephalic, atraumatic, pupils equal round reactive to light, neck supple, no masses, no lymphadenopathy, thyroid nonpalpable. Oropharynx,  nasopharynx, external ear canals are unremarkable. Skin: Warm and dry, no rashes noted.  Cardiac: Regular rate and rhythm, no murmurs rubs or gallops.  Respiratory: Clear to auscultation bilaterally. Not using accessory muscles, speaking in full sentences.  Abdominal: Soft, nontender, nondistended, positive bowel sounds, no masses, no organomegaly.  Musculoskeletal: Shoulder, elbow, wrist, hip, knee, ankle stable, and with full range of motion.  Impression and Recommendations:    The patient was counselled, risk factors were discussed, anticipatory guidance given.  Annual physical exam Unremarkable physical.  Acute bursitis of left shoulder Resolved with injection.  Erectile dysfunction Calling in generic Viagra.  Fatigue Checking testosterone. Does have symptoms of difficulty putting on muscle, fatigue, and poor sex drive not related to poor performance.  Hyperlipidemia LDL goal <160 Low cholesterol diet 3 months been rechecking lipids.   ___________________________________________ [name]. Benjamin Stain, M.D., ABFM., CAQSM. Primary Care and Sports Medicine [facility]  Adjunct Instructor of Family Medicine  [employer]

## 2016-12-03 NOTE — Assessment & Plan Note (Signed)
Low cholesterol diet 3 months been rechecking lipids.

## 2016-12-03 NOTE — Assessment & Plan Note (Signed)
Checking testosterone. Does have symptoms of difficulty putting on muscle, fatigue, and poor sex drive not related to poor performance.

## 2016-12-03 NOTE — Patient Instructions (Signed)
Fat and Cholesterol Restricted Diet High levels of fat and cholesterol in your blood may lead to various health problems, such as diseases of the heart, blood vessels, gallbladder, liver, and pancreas. Fats are concentrated sources of energy that come in various forms. Certain types of fat, including saturated fat, may be harmful in excess. Cholesterol is a substance needed by your body in small amounts. Your body makes all the cholesterol it needs. Excess cholesterol comes from the food you eat. When you have high levels of cholesterol and saturated fat in your blood, health problems can develop because the excess fat and cholesterol will gather along the walls of your blood vessels, causing them to narrow. Choosing the right foods will help you control your intake of fat and cholesterol. This will help keep the levels of these substances in your blood within normal limits and reduce your risk of disease. What is my plan? Your health care provider recommends that you:  Limit your fat intake to 10% or less of your total calories per day.  Eat 20-30 grams of fiber each day.  What types of fat should I choose?  Choose healthy fats more often. Choose monounsaturated and polyunsaturated fats, such as olive and canola oil, flaxseeds, walnuts, almonds, and seeds.  Eat more omega-3 fats. Good choices include salmon, mackerel, sardines, tuna, flaxseed oil, and ground flaxseeds. Aim to eat fish at least two times a week.  Limit saturated fats. Saturated fats are primarily found in animal products, such as meats, butter, and cream. Plant sources of saturated fats include palm oil, palm kernel oil, and coconut oil.  Avoid foods with partially hydrogenated oils in them. These contain trans fats. Examples of foods that contain trans fats are stick margarine, some tub margarines, cookies, crackers, and other baked goods. What general guidelines do I need to follow? These guidelines for healthy eating will help  you control your intake of fat and cholesterol:  Check food labels carefully to identify foods with trans fats or high amounts of saturated fat.  Fill one half of your plate with vegetables and green salads.  Fill one fourth of your plate with whole grains. Look for the word "whole" as the first word in the ingredient list.  Fill one fourth of your plate with lean protein foods.  Limit fruit to two servings a day. Choose fruit instead of juice.  Eat more foods that contain fiber, such as apples, broccoli, carrots, beans, peas, and barley.  Eat more home-cooked food and less restaurant, buffet, and fast food.  Limit or avoid alcohol.  Limit foods high in starch and sugar.  Limit fried foods.  Cook foods using methods other than frying. Baking, boiling, grilling, and broiling are all great options.  Lose weight if you are overweight. Losing just 5-10% of your initial body weight can help your overall health and prevent diseases such as diabetes and heart disease.  What foods can I eat? Grains  Whole grains, such as whole wheat or whole grain breads, crackers, cereals, and pasta. Unsweetened oatmeal, bulgur, barley, quinoa, or brown rice. Corn or whole wheat flour tortillas. Vegetables  Fresh or frozen vegetables (raw, steamed, roasted, or grilled). Green salads. Fruits  All fresh, canned (in natural juice), or frozen fruits. Meats and other protein foods  Ground beef (85% or leaner), grass-fed beef, or beef trimmed of fat. Skinless chicken or [place]. Ground chicken or [place]. Pork trimmed of fat. All fish and seafood. Eggs. Dried beans, peas, or lentils. Unsalted  nuts or seeds. Unsalted canned or dry beans. Dairy  Low-fat dairy products, such as skim or 1% milk, 2% or reduced-fat cheeses, low-fat ricotta or cottage cheese, or plain low-fat yo Fats and oils  Tub margarines without trans fats. Light or reduced-fat mayonnaise and salad dressings. Avocado. Olive, canola, sesame,  or safflower oils. Natural peanut or almond butter (choose ones without added sugar and oil). The items listed above may not be a complete list of recommended foods or beverages. Contact your dietitian for more options. Foods to avoid Grains  White bread. White pasta. White rice. Cornbread. Bagels, pastries, and croissants. Crackers that contain trans fat. Vegetables  White potatoes. Corn. Creamed or fried vegetables. Vegetables in a cheese sauce. Fruits  Dried fruits. Canned fruit in light or heavy syrup. Fruit juice. Meats and other protein foods  Fatty cuts of meat. Ribs, chicken wings, bacon, sausage, bologna, salami, chitterlings, fatback, hot dogs, bratwurst, and packaged luncheon meats. Liver and organ meats. Dairy  Whole or 2% milk, cream, half-and-half, and cream cheese. Whole milk cheeses. Whole-fat or sweetened yogurt. Full-fat cheeses. Nondairy creamers and whipped toppings. Processed cheese, cheese spreads, or cheese curds. Beverages  Alcohol. Sweetened drinks (such as sodas, lemonade, and fruit drinks or punches). Fats and oils  Butter, stick margarine, lard, shortening, ghee, or bacon fat. Coconut, palm kernel, or palm oils. Sweets and desserts  Corn syrup, sugars, honey, and molasses. Candy. Jam and jelly. Syrup. Sweetened cereals. Cookies, pies, cakes, donuts, muffins, and ice cream. The items listed above may not be a complete list of foods and beverages to avoid. Contact your dietitian for more information. This information is not intended to replace advice given to you by your health care provider. Make sure you discuss any questions you have with your health care provider. Document Released: 03/18/2005 Document Revised: 04/08/2014 Document Reviewed: 06/16/2013 Elsevier Interactive Patient Education  2017 [employer].

## 2016-12-03 NOTE — Assessment & Plan Note (Signed)
Calling in generic Viagra. 

## 2016-12-03 NOTE — Assessment & Plan Note (Signed)
Resolved with injection.  

## 2016-12-03 NOTE — Assessment & Plan Note (Signed)
Unremarkable physical. 

## 2016-12-05 LAB — TESTOSTERONE TOTAL,FREE,BIO, MALES
Albumin: 4.2 g/dL (ref 3.6–5.1)
Sex Hormone Binding: 39 nmol/L (ref 10–50)
Testosterone, Bioavailable: 98.9 ng/dL — ABNORMAL LOW (ref >=110.0)
Testosterone, Free: 51.4 pg/mL (ref 46.0–224.0)
Testosterone: 439 ng/dL (ref 250–827)

## 2017-02-27 ENCOUNTER — Telehealth: Payer: Self-pay

## 2017-02-27 DIAGNOSIS — E785 Hyperlipidemia, unspecified: Secondary | ICD-10-CM

## 2017-02-27 NOTE — Telephone Encounter (Signed)
Orders placed, and we need a lipid panel.

## 2017-02-27 NOTE — Telephone Encounter (Signed)
Pt.notified

## 2017-02-27 NOTE — Telephone Encounter (Signed)
Pt left VM to have labs ordered for f/u cholesterol. Would you like any other test ordered? Please advise.

## 2017-03-03 LAB — LIPID PANEL W/REFLEX DIRECT LDL
Cholesterol: 211 mg/dL — ABNORMAL HIGH (ref <200)
HDL: 52 mg/dL (ref >40)
LDL Cholesterol (Calc): 131 mg/dL (calc) — ABNORMAL HIGH
Non-HDL Cholesterol (Calc): 159 mg/dL (calc) — ABNORMAL HIGH (ref <130)
Total CHOL/HDL Ratio: 4.1 (calc) (ref <5.0)
Triglycerides: 162 mg/dL — ABNORMAL HIGH (ref <150)

## 2017-03-04 ENCOUNTER — Ambulatory Visit (INDEPENDENT_AMBULATORY_CARE_PROVIDER_SITE_OTHER): Payer: 59

## 2017-03-04 ENCOUNTER — Ambulatory Visit (INDEPENDENT_AMBULATORY_CARE_PROVIDER_SITE_OTHER): Payer: 59 | Admitting: Sports Medicine

## 2017-03-04 ENCOUNTER — Encounter: Payer: Self-pay | Admitting: Sports Medicine

## 2017-03-04 DIAGNOSIS — M25532 Pain in left wrist: Secondary | ICD-10-CM

## 2017-03-04 DIAGNOSIS — E785 Hyperlipidemia, unspecified: Secondary | ICD-10-CM

## 2017-03-04 DIAGNOSIS — M7552 Bursitis of left shoulder: Secondary | ICD-10-CM | POA: Diagnosis not present

## 2017-03-04 MED ORDER — MELOXICAM 15 MG PO TABS: ORAL_TABLET | ORAL | 3 refills | Status: DC

## 2017-03-04 MED ORDER — ATORVASTATIN CALCIUM 10 MG PO TABS: 10.0000 mg | ORAL_TABLET | Freq: Every day | ORAL | 3 refills | Status: DC

## 2017-03-04 NOTE — Assessment & Plan Note (Signed)
Mild pain at the radiocarpal joint consistent with early osteoarthritis. Meloxicam, x-rays, physical therapy. Return in 1 month, radiocarpal joint injection if no better.

## 2017-03-04 NOTE — Assessment & Plan Note (Signed)
Did well after injection several months ago, having a slight recurrence of pain, adding aggressive formal physical therapy, meloxicam.

## 2017-03-04 NOTE — Assessment & Plan Note (Signed)
Adding atorvastatin 10 mg. Return in 2 months to do a fasting lipid panel.

## 2017-03-04 NOTE — Progress Notes (Signed)
  Subjective:    CC: Left wrist pain  HPI: Devaughn is a pleasant 46 year old male, for months to years he has had mild pain that he localizes over the radial aspect of his left dorsal wrist, he does get stiffness and gelling after periods of inactivity, no mechanical symptoms, no trauma.  Left shoulder pain: We did an injection several months ago, he did extremely well for many months and is only having a minimal recurrence of pain but never did physical therapy, agreeable to proceed this time.  Hyperlipidemia: Mild, really has not done much in terms of the low-cholesterol diet after his previous abnormal lipid panel.  Past medical history:  Negative.  See flowsheet/record as well for more information.  Surgical history: Negative.  See flowsheet/record as well for more information.  Family history: Negative.  See flowsheet/record as well for more information.  Social history: Negative.  See flowsheet/record as well for more information.  Allergies, and medications have been entered into the medical record, reviewed, and no changes needed.   Review of Systems: No fevers, chills, night sweats, weight loss, chest pain, or shortness of breath.   Objective:    General: Well Developed, well nourished, and in no acute distress.  Neuro: Alert and oriented x3, extra-ocular muscles intact, sensation grossly intact.  HEENT: Normocephalic, atraumatic, pupils equal round reactive to light, neck supple, no masses, no lymphadenopathy, thyroid nonpalpable.  Skin: Warm and dry, no rashes. Cardiac: Regular rate and rhythm, no murmurs rubs or gallops, no lower extremity edema.  Respiratory: Clear to auscultation bilaterally. Not using accessory muscles, speaking in full sentences. Left wrist: Inspection normal with no visible erythema or swelling. ROM smooth and normal with good flexion and extension and ulnar/radial deviation that is symmetrical with opposite wrist. Palpation is normal over metacarpals,  navicular, lunate, and TFCC; tendons without tenderness/ swelling. Minimal tenderness over the radiocarpal joint with a negative lunotriquetral shuck test. No snuffbox tenderness. No tenderness over Canal of Gu[place]. Strength 5/5 in all directions without pain. Negative tinel's and phalens signs. Negative Finkelstein sign. Negative Falconi's test.  Impression and Recommendations:    Hyperlipidemia LDL goal <160 Adding atorvastatin 10 mg. Return in 2 months to do a fasting lipid panel.  Acute bursitis of left shoulder Did well after injection several months ago, having a slight recurrence of pain, adding aggressive formal physical therapy, meloxicam.  Left wrist pain Mild pain at the radiocarpal joint consistent with early osteoarthritis. Meloxicam, x-rays, physical therapy. Return in 1 month, radiocarpal joint injection if no better.  I spent 25 minutes with this patient, greater than 50% was face-to-face time counseling regarding the above diagnoses ___________________________________________ [name]. Benjamin Stain, M.D., ABFM., CAQSM. Primary Care and Sports Medicine [facility]  Adjunct Instructor of Family Medicine  [employer]

## 2017-03-19 DIAGNOSIS — M7552 Bursitis of left shoulder: Secondary | ICD-10-CM | POA: Diagnosis not present

## 2017-05-05 ENCOUNTER — Ambulatory Visit: Payer: 59 | Admitting: Sports Medicine

## 2017-12-02 ENCOUNTER — Ambulatory Visit (INDEPENDENT_AMBULATORY_CARE_PROVIDER_SITE_OTHER): Payer: 59 | Admitting: Family Medicine

## 2017-12-02 ENCOUNTER — Encounter: Payer: Self-pay | Admitting: Family Medicine

## 2017-12-02 DIAGNOSIS — H0013 Chalazion right eye, unspecified eyelid: Secondary | ICD-10-CM | POA: Insufficient documentation

## 2017-12-02 DIAGNOSIS — H0011 Chalazion right upper eyelid: Secondary | ICD-10-CM | POA: Diagnosis not present

## 2017-12-02 NOTE — Progress Notes (Signed)
       Sean Serrano is a 47 y.o. male who presents to [facility]: Primary Care Sports Medicine today for stye.  Sean Serrano notes a longer than 1 month history of a stye in his right upper eyelid.  He notes a few weeks ago is inflamed and irritated and has subsequently resolved into a nontender nodule on his right upper eyelid.  When it was more inflamed he treated it with warm compress which helped a little.  He denies any fevers chills nausea vomiting diarrhea or significant change in vision.   ROS as above:  Exam:  BP (!) 143/70   Pulse (!) 56   Temp 98.1 F (36.7 C) (Oral)   Wt 190 lb (86.2 kg)   BMI 27.26 kg/m  Wt Readings from Last 5 Encounters:  12/02/17 190 lb (86.2 kg)  03/04/17 191 lb 8 oz (86.9 kg)  12/03/16 185 lb (83.9 kg)  10/30/16 188 lb 9.6 oz (85.5 kg)  06/15/16 185 lb 12 oz (84.3 kg)    Gen: Well NAD HEENT: EOMI,  MMM firm nontender mobile nodule right upper lateral eyelid approximately 5 mm in diameter.  Lungs: Normal work of breathing. CTABL Heart: RRR no MRG Abd: NABS, Soft. Nondistended, Nontender Exts: Brisk capillary refill, warm and well perfused.   Lab and Radiology Results No results found for this or any previous visit (from the past 72 hour(s)). No results found.    Assessment and Plan: 47 y.o. male with chalazion right upper eyelid.  At this point not treatable with warm compress or antibiotics.  Plan to refer to ophthalmology for evaluation for surgical excision.  Patient declined influenza vaccine today.   Orders Placed This Encounter  Procedures  . Ambulatory referral to Ophthalmology    Referral Priority:   Routine    Referral Type:   Consultation    Referral Reason:   Specialty Services Required    Requested Specialty:   Ophthalmology    Number of Visits Requested:   1   No orders of the defined types were placed in this  encounter.    Historical information moved to improve visibility of documentation.  No past medical history on file. Past Surgical History:  Procedure Laterality Date  . ac separation    . SHOULDER SURGERY     Social History   Tobacco Use  . Smoking status: Never Smoker  . Smokeless tobacco: Never Used  Substance Use Topics  . Alcohol use: Yes   family history is not on file.  Medications: Current Outpatient Medications  Medication Sig Dispense Refill  . atorvastatin (LIPITOR) 10 MG tablet Take 1 tablet (10 mg total) by mouth daily. 90 tablet 3  . meloxicam (MOBIC) 15 MG tablet One tab PO qAM with breakfast for 2 weeks, then daily prn pain. 30 tablet 3  . sildenafil (REVATIO) 20 MG tablet Take 1-5 tablets (20-100 mg total) by mouth as needed. 50 tablet 11   No current facility-administered medications for this visit.    No Known Allergies   Discussed warning signs or symptoms. Please see discharge instructions. Patient expresses understanding.

## 2017-12-02 NOTE — Patient Instructions (Signed)
Thank you for coming in today. You should hear form the eye doctor soon.  If you do not hear anything in 5 business days let me know.   Chalazion A chalazion is a swelling or lump on the eyelid. It can affect the upper or lower eyelid. What are the causes? This condition may be caused by:  Long-lasting (chronic) inflammation of the eyelid glands.  A blocked oil gland in the eyelid.  What are the signs or symptoms? Symptoms of this condition include:  A swelling on the eyelid. The swelling may spread to areas around the eye.  A hard lump on the eyelid. This lump may make it hard to see out of the eye.  How is this diagnosed? This condition is diagnosed with an examination of the eye. How is this treated? This condition is treated by applying a warm compress to the eyelid. If the condition does not improve after two days, it may be treated with:  Surgery.  Medicine that is injected into the chalazion by a health care provider.  Medicine that is applied to the eye.  Follow these instructions at home:  Do not touch the chalazion.  Do not try to remove the pus, such as by squeezing the chalazion or sticking it with a pin or needle.  Do not rub your eyes.  Wash your hands often. Dry your hands with a clean towel.  Keep your face, scalp, and eyebrows clean.  Avoid wearing eye makeup.  Apply a warm, moist compress to the eyelid 4-6 times a day for 10-15 minutes at a time. This will help to open any blocked glands and help to reduce redness and swelling.  Apply over-the-counter and prescription medicines only as told by your health care provider.  If the chalazion does not break open (rupture) on its own in a month, return to your health care provider.  Keep all follow-up appointments as told by your health care provider. This is important. Contact a health care provider if:  Your eyelid has not improved in 4 weeks.  Your eyelid is getting worse.  You have a  fever.  The chalazion does not rupture on its own with home treatment in a month. Get help right away if:  You have pain in your eye.  Your vision changes.  The chalazion becomes painful or red  The chalazion gets bigger. This information is not intended to replace advice given to you by your health care provider. Make sure you discuss any questions you have with your health care provider. Document Released: 03/15/2000 Document Revised: 08/24/2015 Document Reviewed: 07/11/2014 Elsevier Interactive Patient Education  [employer].

## 2017-12-29 DIAGNOSIS — H0011 Chalazion right upper eyelid: Secondary | ICD-10-CM | POA: Diagnosis not present

## 2018-01-01 ENCOUNTER — Telehealth: Payer: Self-pay | Admitting: Family Medicine

## 2018-01-01 NOTE — Telephone Encounter (Signed)
Received note from [facility].  Patient had chalazion excision and drainage of right upper eyelid performed on September 30 by Dr. Hardie Shackleton. Note will be sent to scan.

## 2018-02-03 ENCOUNTER — Other Ambulatory Visit: Payer: Self-pay | Admitting: Sports Medicine

## 2018-02-03 DIAGNOSIS — N529 Male erectile dysfunction, unspecified: Secondary | ICD-10-CM

## 2018-02-27 ENCOUNTER — Other Ambulatory Visit: Payer: Self-pay | Admitting: Sports Medicine

## 2018-02-27 DIAGNOSIS — N529 Male erectile dysfunction, unspecified: Secondary | ICD-10-CM

## 2018-04-10 ENCOUNTER — Encounter: Payer: Self-pay | Admitting: *Deleted

## 2018-04-10 ENCOUNTER — Ambulatory Visit (HOSPITAL_BASED_OUTPATIENT_CLINIC_OR_DEPARTMENT_OTHER)
Admission: RE | Admit: 2018-04-10 | Discharge: 2018-04-10 | Disposition: A | Discharge status: Home / Self Care | Payer: 59 | Source: Ambulatory Visit | Attending: Sports Medicine | Admitting: Sports Medicine

## 2018-04-10 ENCOUNTER — Other Ambulatory Visit: Payer: Self-pay

## 2018-04-10 ENCOUNTER — Emergency Department
Admission: EM | Admit: 2018-04-10 | Discharge: 2018-04-10 | Disposition: A | Discharge status: Home / Self Care | Payer: 59 | Source: Home / Self Care

## 2018-04-10 DIAGNOSIS — I82431 Acute embolism and thrombosis of right popliteal vein: Secondary | ICD-10-CM | POA: Diagnosis not present

## 2018-04-10 DIAGNOSIS — I824Z1 Acute embolism and thrombosis of unspecified deep veins of right distal lower extremity: Secondary | ICD-10-CM | POA: Insufficient documentation

## 2018-04-10 DIAGNOSIS — M25561 Pain in right knee: Secondary | ICD-10-CM | POA: Insufficient documentation

## 2018-04-10 DIAGNOSIS — I82411 Acute embolism and thrombosis of right femoral vein: Secondary | ICD-10-CM | POA: Diagnosis not present

## 2018-04-10 DIAGNOSIS — M79661 Pain in right lower leg: Secondary | ICD-10-CM | POA: Diagnosis not present

## 2018-04-10 DIAGNOSIS — M7989 Other specified soft tissue disorders: Secondary | ICD-10-CM | POA: Diagnosis not present

## 2018-04-10 HISTORY — DX: Hyperlipidemia, unspecified: E78.5

## 2018-04-10 HISTORY — DX: Male erectile dysfunction, unspecified: N52.9

## 2018-04-10 MED ORDER — APIXABAN 5 MG PO TABS: ORAL_TABLET | ORAL | 0 refills | Status: DC

## 2018-04-10 NOTE — Discharge Instructions (Signed)
°  Please drive directly to [facility]. Enter through the emergency department and let them know you have an order for an ultrasound from [facility]. They will direct you to the radiology department.

## 2018-04-10 NOTE — ED Provider Notes (Signed)
[facility]    CSN: [id number] Arrival date & time: 04/10/18  1724     History   Chief Complaint Chief Complaint  Patient presents with  . Knee Pain    HPI Sean Serrano is a 48 y.o. male.   HPI  Sean Serrano is a 48 y.o. male presenting to UC with c/o posterior Right knee pain that started 4-5 days ago.  No known injury but he does report driving to [place] and back on Monday, pain started the next day.  He also rides his bike frequently.  He had a superficial blood clot in his calf about 13 years ago after dislocating his knee and being in a cast. He also reports his sister had a blood clot he believes was due to surgery. Denies chest pain or SOB.  No recent surgeries. No redness but he does feel a "lump" on the back of his knee.     Past Medical History:  Diagnosis Date  . ED (erectile dysfunction)   . Hyperlipidemia     Patient Active Problem List   Diagnosis Date Noted  . Chalazion of right eyelid 12/02/2017  . Erectile dysfunction 12/03/2016  . Hyperlipidemia LDL goal <160 12/03/2016  . Subluxation of peroneal tendon of left foot 09/21/2013  . Annual physical exam 09/14/2013  . Obstructive uropathy 09/14/2013    Past Surgical History:  Procedure Laterality Date  . ac separation    . SHOULDER SURGERY         Home Medications    Prior to Admission medications   Medication Sig Start Date End Date Taking? Authorizing Provider  apixaban (ELIQUIS) 5 MG TABS tablet Take 10mg  twice daily for 7 days, Then take 5mg  twice daily for 7 days 04/10/18   Lurene Shadow, PA-C  atorvastatin (LIPITOR) 10 MG tablet Take 1 tablet (10 mg total) by mouth daily. 03/04/17   Monica Becton, MD  sildenafil (REVATIO) 20 MG tablet TAKE 1-5 TABLETS BY MOUTH AS NEEDED 03/01/18   Monica Becton, MD    Family History History reviewed. No pertinent family history.  Social History Social History   Tobacco Use  . Smoking status: Never Smoker    . Smokeless tobacco: Never Used  Substance Use Topics  . Alcohol use: Yes  . Drug use: No     Allergies   Patient has no known allergies.   Review of Systems Review of Systems  Musculoskeletal: Positive for arthralgias ( posterior Right knee), joint swelling and myalgias.  Skin: Negative for color change.  Neurological: Negative for weakness and numbness.     Physical Exam Triage Vital Signs ED Triage Vitals  Enc Vitals Group     BP 04/10/18 1745 (!) 151/98     Pulse Rate 04/10/18 1745 82     Resp 04/10/18 1745 16     Temp 04/10/18 1745 98.1 F (36.7 C)     Temp Source 04/10/18 1745 Oral     SpO2 04/10/18 1745 97 %     Weight 04/10/18 1747 190 lb (86.2 kg)     Height 04/10/18 1747 5\' 10"  (1.778 m)     Head Circumference --      Peak Flow --      Pain Score 04/10/18 1745 2     Pain Loc --      Pain Edu? --      Excl. in GC? --    No data found.  Updated Vital Signs BP Marland Kitchen)  151/98 (BP Location: Right Arm)   Pulse 82   Temp 98.1 F (36.7 C) (Oral)   Resp 16   Ht 5\' 10"  (1.778 m)   Wt 190 lb (86.2 kg)   SpO2 97%   BMI 27.26 kg/m   Visual Acuity Right Eye Distance:   Left Eye Distance:   Bilateral Distance:    Right Eye Near:   Left Eye Near:    Bilateral Near:     Physical Exam Vitals signs and nursing note reviewed.  Constitutional:      Appearance: Normal appearance. He is well-developed.  HENT:     Head: Normocephalic and atraumatic.  Neck:     Musculoskeletal: Normal range of motion.  Cardiovascular:     Rate and Rhythm: Normal rate.  Pulmonary:     Effort: Pulmonary effort is normal.  Musculoskeletal: Normal range of motion.        General: Tenderness present. No deformity.     Comments: Right knee: full ROM, tenderness to posterior knee. Palpable 2cm soft mass.  Calf is soft, non-tender.  Skin:    General: Skin is warm and dry.     Capillary Refill: Capillary refill takes less than 2 seconds.     Findings: No erythema.   Neurological:     Mental Status: He is alert and oriented to person, place, and time.  Psychiatric:        Behavior: Behavior normal.      UC Treatments / Results  Labs (all labs ordered are listed, but only abnormal results are displayed) Labs Reviewed - No data to display  EKG None  Radiology [place] Venous Img Lower Unilateral Right  Result Date: 04/10/2018 CLINICAL DATA:  Right posterior lower extremity pain and swelling for approximately 5 days, after long car ride. EXAM: RIGHT LOWER EXTREMITY VENOUS DOPPLER ULTRASOUND TECHNIQUE: Gray-scale sonography with graded compression, as well as color Doppler and duplex ultrasound were performed to evaluate the lower extremity deep venous systems from the level of the common femoral vein and including the common femoral, femoral, profunda femoral, popliteal and calf veins including the posterior tibial, peroneal and gastrocnemius veins when visible. The superficial great saphenous vein was also interrogated. Spectral Doppler was utilized to evaluate flow at rest and with distal augmentation maneuvers in the common femoral, femoral and popliteal veins. COMPARISON:  None. FINDINGS: Contralateral Common Femoral Vein: Respiratory phasicity is normal and symmetric with the symptomatic side. No evidence of thrombus. Normal compressibility. Common Femoral Vein: No evidence of thrombus. Normal compressibility, respiratory phasicity and response to augmentation. Saphenofemoral Junction: No evidence of thrombus. Normal compressibility and flow on color Doppler imaging. Profunda Femoral Vein: No evidence of thrombus. Normal compressibility and flow on color Doppler imaging. Femoral Vein: Nonocclusive thrombus is seen in the mid and distal femoral vein. Popliteal Vein: Occlusive thrombus is seen throughout the popliteal vein. Calf Veins: Nonocclusive thrombus is seen within the visualized posterior tibial and peroneal veins. Superficial Great Saphenous Vein: No  evidence of thrombus. Normal compressibility. Venous Reflux:  None. Other Findings:  None. IMPRESSION: Positive for deep venous thrombosis in the right femoral, popliteal, and visualized calf veins. Electronically Signed   By: Myles RosenthalJohn  Stahl M.D.   On: 04/10/2018 19:09    Procedures Procedures (including critical care time)  Medications Ordered in UC Medications - No data to display  Initial Impression / Assessment and Plan / UC Course  I have reviewed the triage vital signs and the nursing notes.  Pertinent labs & imaging results  that were available during my care of the patient were reviewed by me and considered in my medical decision making (see chart for details).     Discussed imaging with pt over the phone while [facility] was [facility]. [facility].  Pt understands importance of starting the medication tonight and to call Dr. Benjamin Stainhekkekandam, his PCP, on Monday to schedule a f/u visit.   Final Clinical Impressions(s) / UC Diagnoses   Final diagnoses:  Posterior right knee pain     Discharge Instructions      [facility]. Enter through the emergency department and let them know you have an order for an ultrasound [facility]. They will direct you to the radiology department.     ED Prescriptions    Medication Sig Dispense Auth. Provider   apixaban (ELIQUIS) 5 MG TABS tablet Take 10mg  twice daily for 7 days, Then take 5mg  twice daily for 7 days 42 tablet Lurene ShadowPhelps, Annella Prowell O, PA-C     Controlled Substance Prescriptions [place] Controlled Substance Registry consulted? Not Applicable   Rolla Platehelps, Kemuel Buchmann O, PA-C 04/10/18 [id number]

## 2018-04-10 NOTE — ED Triage Notes (Signed)
Pt c/o pain behind his RT knee x 5 days. Denies injury.He reports driving to [place] and back Monday. Hx of blood clot 13 years ago after dislocating his knee. Sister hx of blood clot.

## 2018-04-13 ENCOUNTER — Encounter: Payer: Self-pay | Admitting: Sports Medicine

## 2018-04-13 ENCOUNTER — Ambulatory Visit (INDEPENDENT_AMBULATORY_CARE_PROVIDER_SITE_OTHER): Payer: 59 | Admitting: Sports Medicine

## 2018-04-13 DIAGNOSIS — I82461 Acute embolism and thrombosis of right calf muscular vein: Secondary | ICD-10-CM

## 2018-04-13 MED ORDER — APIXABAN 5 MG PO TABS: 5.0000 mg | ORAL_TABLET | Freq: Two times a day (BID) | ORAL | 1 refills | Status: DC

## 2018-04-13 NOTE — Progress Notes (Signed)
Subjective:    CC: DVT  HPI: Sean Serrano is a pleasant 48 year old male, he has a history of a right knee superficial venous thrombosis but has never had a DVT.  Recently he was on a trip up to [place] and back, really did not stop at all during the trip.  He started to notice pain and swelling behind his right knee.  He was seen in urgent care, an ultrasound was appropriately performed that showed a right gastrocnemius deep vein thrombosis.  He was started on Eliquis and he is here for further evaluation.  Overall feels pretty good.  No chest pain, shortness of breath.  Symptoms are mild, improving.  I reviewed the past medical history, family history, social history, surgical history, and allergies today and no changes were needed.  Please see the problem list section below in epic for further details.  Past Medical History: Past Medical History:  Diagnosis Date  . ED (erectile dysfunction)   . Hyperlipidemia    Past Surgical History: Past Surgical History:  Procedure Laterality Date  . ac separation    . SHOULDER SURGERY     Social History: Social History   Socioeconomic History  . Marital status: Married    Spouse name: Not on file  . Number of children: Not on file  . Years of education: Not on file  . Highest education level: Not on file  Occupational History  . Not on file  Social Needs  . Financial resource strain: Not on file  . Food insecurity:    Worry: Not on file    Inability: Not on file  . Transportation needs:    Medical: Not on file    Non-medical: Not on file  Tobacco Use  . Smoking status: Never Smoker  . Smokeless tobacco: Never Used  Substance and Sexual Activity  . Alcohol use: Yes  . Drug use: No  . Sexual activity: Not on file  Lifestyle  . Physical activity:    Days per week: Not on file    Minutes per session: Not on file  . Stress: Not on file  Relationships  . Social connections:    Talks on phone: Not on file    Gets together:  Not on file    Attends religious service: Not on file    Active member of club or organization: Not on file    Attends meetings of clubs or organizations: Not on file    Relationship status: Not on file  Other Topics Concern  . Not on file  Social History Narrative  . Not on file   Family History: No family history on file. Allergies: No Known Allergies Medications: See med rec.  Review of Systems: No fevers, chills, night sweats, weight loss, chest pain, or shortness of breath.   Objective:    General: Well Developed, well nourished, and in no acute distress.  Neuro: Alert and oriented x3, extra-ocular muscles intact, sensation grossly intact.  HEENT: Normocephalic, atraumatic, pupils equal round reactive to light, neck supple, no masses, no lymphadenopathy, thyroid nonpalpable.  Skin: Warm and dry, no rashes. Cardiac: Regular rate and rhythm, no murmurs rubs or gallops, no lower extremity edema.  Respiratory: Clear to auscultation bilaterally. Not using accessory muscles, speaking in full sentences. Right leg: Tender to palpation in the posterior knee, negative Homans sign, really does not have much swelling.  Impression and Recommendations:    Acute deep vein thrombosis (DVT) of calf muscle vein of right lower extremity This was  a provoked DVT after a long drive up and back to [place]. Already on Eliquis, I will give him the extended course, full 6 months at 5 mg twice a day. Discount coupon given. He will get compression hose. I like to see him back in 3 months and recheck his leg and likely get another ultrasound. Ultimately for long drives after Eliquis discontinues he should just take an aspirin as well as get up and stretch every couple of hours. Anticipatory guidance given for blood thinner usage.   ___________________________________________ [name]. Benjamin Stain, M.D., ABFM., CAQSM. Primary Care and Sports Medicine [facility]  [facility]  Adjunct Professor of Family Medicine  [employer]

## 2018-04-13 NOTE — Patient Instructions (Signed)

## 2018-04-13 NOTE — Assessment & Plan Note (Addendum)
This was a provoked DVT after a long drive up and back to [place]. Already on Eliquis, I will give him the extended course, full 6 months at 5 mg twice a day. Discount coupon given. He will get compression hose. I like to see him back in 3 months and recheck his leg and likely get another ultrasound. Ultimately for long drives after Eliquis discontinues he should just take an aspirin as well as get up and stretch every couple of hours. Anticipatory guidance given for blood thinner usage.

## 2018-07-13 ENCOUNTER — Other Ambulatory Visit: Payer: Self-pay

## 2018-07-13 ENCOUNTER — Ambulatory Visit (INDEPENDENT_AMBULATORY_CARE_PROVIDER_SITE_OTHER): Payer: 59 | Admitting: Sports Medicine

## 2018-07-13 ENCOUNTER — Ambulatory Visit: Payer: 59

## 2018-07-13 ENCOUNTER — Encounter: Payer: Self-pay | Admitting: Sports Medicine

## 2018-07-13 DIAGNOSIS — E785 Hyperlipidemia, unspecified: Secondary | ICD-10-CM | POA: Diagnosis not present

## 2018-07-13 DIAGNOSIS — N139 Obstructive and reflux uropathy, unspecified: Secondary | ICD-10-CM

## 2018-07-13 DIAGNOSIS — I82401 Acute embolism and thrombosis of unspecified deep veins of right lower extremity: Secondary | ICD-10-CM | POA: Diagnosis not present

## 2018-07-13 DIAGNOSIS — I82461 Acute embolism and thrombosis of right calf muscular vein: Secondary | ICD-10-CM

## 2018-07-13 NOTE — Assessment & Plan Note (Signed)
Checking PSA 

## 2018-07-13 NOTE — Progress Notes (Addendum)
Subjective:    CC: Follow-up  HPI: Acute right calf vein DVT: Asymptomatic.  No symptoms or signs of bleeding.  Hyperlipidemia: Due to recheck lipids.  Obstructive uropathy: Stable, due to recheck labs.  I reviewed the past medical history, family history, social history, surgical history, and allergies today and no changes were needed.  Please see the problem list section below in epic for further details.  Past Medical History: Past Medical History:  Diagnosis Date  . ED (erectile dysfunction)   . Hyperlipidemia    Past Surgical History: Past Surgical History:  Procedure Laterality Date  . ac separation    . SHOULDER SURGERY     Social History: Social History   Socioeconomic History  . Marital status: Married    Spouse name: Not on file  . Number of children: Not on file  . Years of education: Not on file  . Highest education level: Not on file  Occupational History  . Not on file  Social Needs  . Financial resource strain: Not on file  . Food insecurity:    Worry: Not on file    Inability: Not on file  . Transportation needs:    Medical: Not on file    Non-medical: Not on file  Tobacco Use  . Smoking status: Never Smoker  . Smokeless tobacco: Never Used  Substance and Sexual Activity  . Alcohol use: Yes  . Drug use: No  . Sexual activity: Not on file  Lifestyle  . Physical activity:    Days per week: Not on file    Minutes per session: Not on file  . Stress: Not on file  Relationships  . Social connections:    Talks on phone: Not on file    Gets together: Not on file    Attends religious service: Not on file    Active member of club or organization: Not on file    Attends meetings of clubs or organizations: Not on file    Relationship status: Not on file  Other Topics Concern  . Not on file  Social History Narrative  . Not on file   Family History: No family history on file. Allergies: No Known Allergies Medications: See med rec.  Review  of Systems: No fevers, chills, night sweats, weight loss, chest pain, or shortness of breath.   Objective:    General: Well Developed, well nourished, and in no acute distress.  Neuro: Alert and oriented x3, extra-ocular muscles intact, sensation grossly intact.  HEENT: Normocephalic, atraumatic, pupils equal round reactive to light, neck supple, no masses, no lymphadenopathy, thyroid nonpalpable.  Skin: Warm and dry, no rashes. Cardiac: Regular rate and rhythm, no murmurs rubs or gallops, no lower extremity edema.  Respiratory: Clear to auscultation bilaterally. Not using accessory muscles, speaking in full sentences. Right lower extremity: Completely unremarkable to inspection, negative Homans sign, no swelling.  Neurovascular intact distally.  Impression and Recommendations:    Acute deep vein thrombosis (DVT) of calf muscle vein of right lower extremity Has finished 3 months of Eliquis for provoked lower extremity DVT in the right calf veins. We will do 3 more months of Eliquis to complete a full 8738-month course. Repeating ultrasound today. Completely asymptomatic. I have advised him that for long drives when work starts back up he should take a baby aspirin for drives longer than 2 to 3 hours.   Slight improvement in the DVT, but it is overall still present further necessitating at least 3 more months of anticoagulation.  It is not uncommon to have to do 1 year.  Hyperlipidemia LDL goal <160 Need to recheck all labs.  Obstructive uropathy Checking PSA.   ___________________________________________ Sean Serrano. Benjamin Stain, M.D., ABFM., CAQSM. Primary Care and Sports Medicine [facility]  Adjunct Professor of Family Medicine  [employer]

## 2018-07-13 NOTE — Assessment & Plan Note (Addendum)
Has finished 3 months of Eliquis for provoked lower extremity DVT in the right calf veins. We will do 3 more months of Eliquis to complete a full 29-month course. Repeating ultrasound today. Completely asymptomatic. I have advised him that for long drives when work starts back up he should take a baby aspirin for drives longer than 2 to 3 hours.   Slight improvement in the DVT, but it is overall still present further necessitating at least 3 more months of anticoagulation.  It is not uncommon to have to do 1 year.

## 2018-07-13 NOTE — Assessment & Plan Note (Signed)
Need to recheck all labs.

## 2018-07-14 LAB — LIPID PANEL W/REFLEX DIRECT LDL
Cholesterol: 176 mg/dL (ref <200)
HDL: 68 mg/dL (ref >=40)
LDL Cholesterol (Calc): 89 mg/dL (calc)
Non-HDL Cholesterol (Calc): 108 mg/dL (calc) (ref <130)
Total CHOL/HDL Ratio: 2.6 (calc) (ref <5.0)
Triglycerides: 98 mg/dL (ref <150)

## 2018-07-14 LAB — TSH: TSH: 2.71 mIU/L (ref 0.40–4.50)

## 2018-07-14 LAB — COMPREHENSIVE METABOLIC PANEL
AG Ratio: 1.9 (calc) (ref 1.0–2.5)
ALT: 67 U/L — ABNORMAL HIGH (ref 9–46)
AST: 26 U/L (ref 10–35)
Albumin: 4.4 g/dL (ref 3.6–5.1)
Alkaline phosphatase (APISO): 45 U/L (ref 35–144)
BUN: 19 mg/dL (ref 7–25)
CO2: 25 mmol/L (ref 20–32)
Calcium: 9.3 mg/dL (ref 8.6–10.3)
Chloride: 106 mmol/L (ref 98–110)
Creat: 0.95 mg/dL (ref 0.70–1.33)
Globulin: 2.3 g/dL (calc) (ref 1.9–3.7)
Glucose, Bld: 92 mg/dL (ref 65–99)
Potassium: 4.4 mmol/L (ref 3.5–5.3)
Sodium: 139 mmol/L (ref 135–146)
Total Bilirubin: 0.5 mg/dL (ref 0.2–1.2)
Total Protein: 6.7 g/dL (ref 6.1–8.1)

## 2018-07-14 LAB — CBC
HCT: 44.2 % (ref 38.5–50.0)
Hemoglobin: 15.6 g/dL (ref 13.2–17.1)
MCH: 32.7 pg (ref 27.0–33.0)
MCHC: 35.3 g/dL (ref 32.0–36.0)
MCV: 92.7 fL (ref 80.0–100.0)
MPV: 10.2 fL (ref 7.5–12.5)
Platelets: 222 10*3/uL (ref 140–400)
RBC: 4.77 10*6/uL (ref 4.20–5.80)
RDW: 13.2 % (ref 11.0–15.0)
WBC: 6.4 10*3/uL (ref 3.8–10.8)

## 2018-07-14 LAB — URIC ACID: Uric Acid, Serum: 6.8 mg/dL (ref 4.0–8.0)

## 2018-07-14 LAB — HEMOGLOBIN A1C
Hgb A1c MFr Bld: 5.3 % of total Hgb (ref <5.7)
Mean Plasma Glucose: 105 (calc)
eAG (mmol/L): 5.8 (calc)

## 2018-07-14 LAB — VITAMIN D 25 HYDROXY (VIT D DEFICIENCY, FRACTURES): Vit D, 25-Hydroxy: 32 ng/mL (ref 30–100)

## 2018-08-18 ENCOUNTER — Other Ambulatory Visit: Payer: Self-pay | Admitting: Sports Medicine

## 2018-08-18 DIAGNOSIS — N529 Male erectile dysfunction, unspecified: Secondary | ICD-10-CM

## 2018-08-25 ENCOUNTER — Emergency Department
Admission: EM | Admit: 2018-08-25 | Discharge: 2018-08-25 | Disposition: A | Discharge status: Home / Self Care | Payer: 59 | Source: Home / Self Care

## 2018-08-25 ENCOUNTER — Emergency Department (INDEPENDENT_AMBULATORY_CARE_PROVIDER_SITE_OTHER): Payer: 59

## 2018-08-25 ENCOUNTER — Other Ambulatory Visit: Payer: Self-pay

## 2018-08-25 DIAGNOSIS — W540XXA Bitten by dog, initial encounter: Secondary | ICD-10-CM

## 2018-08-25 DIAGNOSIS — S61451A Open bite of right hand, initial encounter: Secondary | ICD-10-CM

## 2018-08-25 MED ORDER — AMOXICILLIN-POT CLAVULANATE 875-125 MG PO TABS: 1.0000 | ORAL_TABLET | Freq: Two times a day (BID) | ORAL | 0 refills | Status: DC

## 2018-08-25 NOTE — ED Provider Notes (Signed)
[facility]    CSN: [id number] Arrival date & time: 08/25/18  0808     History   Chief Complaint Chief Complaint  Patient presents with  . Animal Bite    HPI Sean Serrano is a 48 y.o. male.   HPI Sean Serrano is a 48 y.o. male presenting to UC with c/o dog bite to his Right hand.  He states his 50yo lab was in the garage when pt attempted to encourage him to go out into the lawn to go to the bathroom when dog uncharacteristically bit him around 6:30AM this morning. Bleeding controlled PTA. Pt states pain was initially severe and caused his ring finger to go numb and his hand to swell up very quickly. Pt concerned it could be broken.  Pain is gradually improving with time.  Pain is aching, 5/10 at this time.  No medication taken PTA.  He is currently on Eliquis for a DVT in his Left leg, he believes due to driving often.   The dog is UTD on rabies and pt's last Tdap was less than 5 years ago.     Past Medical History:  Diagnosis Date  . ED (erectile dysfunction)   . Hyperlipidemia     Patient Active Problem List   Diagnosis Date Noted  . Acute deep vein thrombosis (DVT) of calf muscle vein of right lower extremity 04/13/2018  . Chalazion of right eyelid 12/02/2017  . Erectile dysfunction 12/03/2016  . Hyperlipidemia LDL goal <160 12/03/2016  . Subluxation of peroneal tendon of left foot 09/21/2013  . Annual physical exam 09/14/2013  . Obstructive uropathy 09/14/2013    Past Surgical History:  Procedure Laterality Date  . ac separation    . SHOULDER SURGERY         Home Medications    Prior to Admission medications   Medication Sig Start Date End Date Taking? Authorizing Provider  amoxicillin-clavulanate (AUGMENTIN) 875-125 MG tablet Take 1 tablet by mouth 2 (two) times daily. One po bid x 7 days 08/25/18   Lurene ShadowPhelps, Phyillis Dascoli O, PA-C  apixaban (ELIQUIS) 5 MG TABS tablet Take 1 tablet (5 mg total) by mouth 2 (two) times daily. 04/13/18   Monica Bectonhekkekandam,  Thomas J, MD  atorvastatin (LIPITOR) 10 MG tablet Take 1 tablet (10 mg total) by mouth daily. 03/04/17   Monica Bectonhekkekandam, Thomas J, MD  sildenafil (REVATIO) 20 MG tablet TAKE 1-5 TABLETS BY MOUTH AS NEEDED 08/18/18   Monica Bectonhekkekandam, Thomas J, MD    Family History History reviewed. No pertinent family history.  Social History Social History   Tobacco Use  . Smoking status: Never Smoker  . Smokeless tobacco: Never Used  Substance Use Topics  . Alcohol use: Yes  . Drug use: No     Allergies   Patient has no known allergies.   Review of Systems Review of Systems  Musculoskeletal: Positive for arthralgias, joint swelling and myalgias.  Skin: Positive for wound. Negative for color change.  Neurological: Positive for numbness. Negative for weakness.     Physical Exam Triage Vital Signs ED Triage Vitals [08/25/18 0832]  Enc Vitals Group     BP 138/85     Pulse Rate 63     Resp 20     Temp (!) 97.4 F (36.3 C)     Temp Source Tympanic     SpO2 98 %     Weight 190 lb (86.2 kg)     Height 5' 9.5" (1.765 m)  Head Circumference      Peak Flow      Pain Score 5     Pain Loc      Pain Edu?      Excl. in GC?    No data found.  Updated Vital Signs BP 138/85 (BP Location: Right Arm)   Pulse 63   Temp (!) 97.4 F (36.3 C) (Tympanic)   Resp 20   Ht 5' 9.5" (1.765 m)   Wt 190 lb (86.2 kg)   SpO2 98%   BMI 27.66 kg/m   Visual Acuity Right Eye Distance:   Left Eye Distance:   Bilateral Distance:    Right Eye Near:   Left Eye Near:    Bilateral Near:     Physical Exam Vitals signs and nursing note reviewed.  Constitutional:      Appearance: Normal appearance. He is well-developed.  HENT:     Head: Normocephalic and atraumatic.  Neck:     Musculoskeletal: Normal range of motion.  Cardiovascular:     Rate and Rhythm: Normal rate.  Pulmonary:     Effort: Pulmonary effort is normal.  Musculoskeletal: Normal range of motion.        General: Swelling and  tenderness present.       Hands:     Comments: Right hand: mild to moderate edema to dorsal aspect of hand. Mild tenderness over 2nd-4th metacarpals. Full ROM hand, 5/5 grip strength.   Skin:    General: Skin is warm and dry.     Capillary Refill: Capillary refill takes less than 2 seconds.     Comments: Right hand, dorsal aspect: 2 puncture wounds with 2 smaller superficial abrasions. Volar aspect: superficial appearing puncture wound over 4th MCP. Smaller abrasion to center of hand. Hematoma to dorsal aspect of hand.  Neurological:     Mental Status: He is alert and oriented to person, place, and time.     Comments: Slight decreased sensation to Right ring finger compared to the rest of his fingers.  Psychiatric:        Behavior: Behavior normal.      UC Treatments / Results  Labs (all labs ordered are listed, but only abnormal results are displayed) Labs Reviewed - No data to display  EKG None  Radiology Dg Hand Complete Right  Result Date: 08/25/2018 CLINICAL DATA:  Dog bite, now with abrasions about the third and fourth metacarpals. EXAM: RIGHT HAND - COMPLETE 3+ VIEW COMPARISON:  None. FINDINGS: Soft tissue swelling about the dorsum of the hand. No associated fracture or radiopaque foreign body. There is a punctate ossicle adjacent to the head of the first meta carpal, potentially an accessory sesamoid bone versus the sequela of remote avulsive injury. Joint spaces are preserved. No erosions. No evidence of chondrocalcinosis. IMPRESSION: Soft tissue swelling about the dorsum of the hand without associated fracture or radiopaque foreign body. Electronically Signed   By: Simonne Come M.D.   On: 08/25/2018 08:52    Procedures Procedures (including critical care time)  Medications Ordered in UC Medications - No data to display  Initial Impression / Assessment and Plan / UC Course  I have reviewed the triage vital signs and the nursing notes.  Pertinent labs & imaging  results that were available during my care of the patient were reviewed by me and considered in my medical decision making (see chart for details).     Wound flushed with copious amount of saline and Hibiclens using syringe  Reassured pt  of no fracture on x-ray Wound bandaged using coban to apply slight pressure to help hematoma on dorsal aspect of hand (likely attributed somewhat to the Eliquise pt is on) Rx: Augmentin AVS provided  Final Clinical Impressions(s) / UC Diagnoses   Final diagnoses:  Dog bite of right hand, initial encounter     Discharge Instructions      You may change your bandage 1-2 times daily, more frequent if it becomes wet or dirty.  You may use a smaller bandage after the first 2 days as swelling goes down and compression is no longer needed.  Please take your antibiotics as prescribed to help prevent infection from setting in.   If you develop increased redness, drainage, pain, fever, or other new concerning symptoms develop, please follow up in urgent care or with Dr. Benjamin Stain for a wound recheck.      ED Prescriptions    Medication Sig Dispense Auth. Provider   amoxicillin-clavulanate (AUGMENTIN) 875-125 MG tablet Take 1 tablet by mouth 2 (two) times daily. One po bid x 7 days 14 tablet Lurene Shadow, [place]     Controlled Substance Prescriptions [place] Controlled Substance Registry consulted? Not Applicable   Rolla Plate 08/25/18 1143

## 2018-08-25 NOTE — Discharge Instructions (Signed)
°  You may change your bandage 1-2 times daily, more frequent if it becomes wet or dirty.  You may use a smaller bandage after the first 2 days as swelling goes down and compression is no longer needed.  Please take your antibiotics as prescribed to help prevent infection from setting in.   If you develop increased redness, drainage, pain, fever, or other new concerning symptoms develop, please follow up in urgent care or with Dr. Benjamin Stain for a wound recheck.

## 2018-08-25 NOTE — ED Triage Notes (Signed)
Pt was bitten by his dog this am around 6:30.  Puncture wound on palm and back of hand.  Hand swollen.  Rabies up to date, TDap current and less than 5 years.  Has traveled to [facility] in the last 14 days.

## 2018-08-26 ENCOUNTER — Telehealth: Payer: Self-pay

## 2018-08-26 NOTE — Telephone Encounter (Signed)
Spoke with patient, hand feels stiff this am.  No signs of infection.  Will follow up as needed.

## 2018-10-05 ENCOUNTER — Other Ambulatory Visit: Payer: Self-pay | Admitting: Sports Medicine

## 2018-10-05 DIAGNOSIS — E785 Hyperlipidemia, unspecified: Secondary | ICD-10-CM

## 2018-10-05 MED ORDER — ATORVASTATIN CALCIUM 10 MG PO TABS: 10.0000 mg | ORAL_TABLET | Freq: Every day | ORAL | 3 refills | Status: DC

## 2018-10-12 ENCOUNTER — Ambulatory Visit: Payer: 59 | Admitting: Sports Medicine

## 2018-12-21 ENCOUNTER — Encounter: Payer: Self-pay | Admitting: Sports Medicine

## 2018-12-21 ENCOUNTER — Ambulatory Visit (INDEPENDENT_AMBULATORY_CARE_PROVIDER_SITE_OTHER): Payer: 59 | Admitting: Sports Medicine

## 2018-12-21 ENCOUNTER — Other Ambulatory Visit: Payer: Self-pay

## 2018-12-21 VITALS — BP 132/69 | HR 74 | Ht 69.5 in | Wt 196.0 lb

## 2018-12-21 DIAGNOSIS — I82461 Acute embolism and thrombosis of right calf muscular vein: Secondary | ICD-10-CM

## 2018-12-21 DIAGNOSIS — Z1211 Encounter for screening for malignant neoplasm of colon: Secondary | ICD-10-CM

## 2018-12-21 DIAGNOSIS — E785 Hyperlipidemia, unspecified: Secondary | ICD-10-CM

## 2018-12-21 DIAGNOSIS — N139 Obstructive and reflux uropathy, unspecified: Secondary | ICD-10-CM | POA: Diagnosis not present

## 2018-12-21 DIAGNOSIS — Z0000 Encounter for general adult medical examination without abnormal findings: Secondary | ICD-10-CM

## 2018-12-21 MED ORDER — ASPIRIN EC 81 MG PO TBEC: 81.0000 mg | DELAYED_RELEASE_TABLET | Freq: Every day | ORAL | 3 refills | Status: DC

## 2018-12-21 NOTE — Assessment & Plan Note (Signed)
At this point Sean Serrano is completed approximately 9 months of Eliquis for a provoked DVT in the right lower extremity. At this point I am going to switch him to a baby aspirin for use daily, not just on long drives. Of note he did have a left-sided superficial venous thrombosis years ago. He can return to see me as needed.

## 2018-12-21 NOTE — Progress Notes (Signed)
Subjective:    CC: Follow-up  HPI: Sean Serrano is now 8.5 months post acute provoked DVT in the right calf veins.  He has done 8.5 months of Eliquis.  He is completely asymptomatic now, no concerns or complaints.  He is going to set up a physical and would like his routine labs drawn.  I reviewed the past medical history, family history, social history, surgical history, and allergies today and no changes were needed.  Please see the problem list section below in epic for further details.  Past Medical History: Past Medical History:  Diagnosis Date  . ED (erectile dysfunction)   . Hyperlipidemia    Past Surgical History: Past Surgical History:  Procedure Laterality Date  . ac separation    . SHOULDER SURGERY     Social History: Social History   Socioeconomic History  . Marital status: Married    Spouse name: Not on file  . Number of children: Not on file  . Years of education: Not on file  . Highest education level: Not on file  Occupational History  . Not on file  Social Needs  . Financial resource strain: Not on file  . Food insecurity    Worry: Not on file    Inability: Not on file  . Transportation needs    Medical: Not on file    Non-medical: Not on file  Tobacco Use  . Smoking status: Never Smoker  . Smokeless tobacco: Never Used  Substance and Sexual Activity  . Alcohol use: Yes  . Drug use: No  . Sexual activity: Not on file  Lifestyle  . Physical activity    Days per week: Not on file    Minutes per session: Not on file  . Stress: Not on file  Relationships  . Social Herbalist on phone: Not on file    Gets together: Not on file    Attends religious service: Not on file    Active member of club or organization: Not on file    Attends meetings of clubs or organizations: Not on file    Relationship status: Not on file  Other Topics Concern  . Not on file  Social History Narrative  . Not on file   Family History: No family history on  file. Allergies: No Known Allergies Medications: See med rec.  Review of Systems: No fevers, chills, night sweats, weight loss, chest pain, or shortness of breath.   Objective:    General: Well Developed, well nourished, and in no acute distress.  Neuro: Alert and oriented x3, extra-ocular muscles intact, sensation grossly intact.  HEENT: Normocephalic, atraumatic, pupils equal round reactive to light, neck supple, no masses, no lymphadenopathy, thyroid nonpalpable.  Skin: Warm and dry, no rashes. Cardiac: Regular rate and rhythm, no murmurs rubs or gallops, no lower extremity edema.  Respiratory: Clear to auscultation bilaterally. Not using accessory muscles, speaking in full sentences. Right leg: Negative Homans sign, essentially no swelling.  Impression and Recommendations:    Acute deep vein thrombosis (DVT) of calf muscle vein of right lower extremity At this point Sean Serrano is completed approximately 9 months of Eliquis for a provoked DVT in the right lower extremity. At this point I am going to switch him to a baby aspirin for use daily, not just on long drives. Of note he did have a left-sided superficial venous thrombosis years ago. He can return to see me as needed.  Annual physical exam Sean Serrano return for his physical, ordering  Cologuard and routine labs. We can catch him up on vaccinations at his follow-up. He will probably need Shingrix at his physical.   ___________________________________________ Sean Serrano. Sean Serrano, M.D., ABFM., CAQSM. Primary Care and Sports Medicine [facility]  Adjunct Professor of Family Medicine  [employer]

## 2018-12-21 NOTE — Assessment & Plan Note (Signed)
Sean Serrano return for his physical, ordering Cologuard and routine labs. We can catch him up on vaccinations at his follow-up. He will probably need Shingrix at his physical.

## 2018-12-23 LAB — COMPLETE METABOLIC PANEL WITH GFR
AG Ratio: 1.5 (calc) (ref 1.0–2.5)
ALT: 30 U/L (ref 9–46)
AST: 21 U/L (ref 10–35)
Albumin: 4.3 g/dL (ref 3.6–5.1)
Alkaline phosphatase (APISO): 40 U/L (ref 35–144)
BUN: 17 mg/dL (ref 7–25)
CO2: 25 mmol/L (ref 20–32)
Calcium: 9.2 mg/dL (ref 8.6–10.3)
Chloride: 106 mmol/L (ref 98–110)
Creat: 0.89 mg/dL (ref 0.70–1.33)
GFR, Est African American: 116 mL/min/{1.73_m2} (ref >=60)
GFR, Est Non African American: 100 mL/min/{1.73_m2} (ref >=60)
Globulin: 2.8 g/dL (calc) (ref 1.9–3.7)
Glucose, Bld: 84 mg/dL (ref 65–99)
Potassium: 4.7 mmol/L (ref 3.5–5.3)
Sodium: 140 mmol/L (ref 135–146)
Total Bilirubin: 0.3 mg/dL (ref 0.2–1.2)
Total Protein: 7.1 g/dL (ref 6.1–8.1)

## 2018-12-23 LAB — LIPID PANEL W/REFLEX DIRECT LDL
Cholesterol: 230 mg/dL — ABNORMAL HIGH (ref <200)
HDL: 64 mg/dL (ref >=40)
LDL Cholesterol (Calc): 140 mg/dL (calc) — ABNORMAL HIGH
Non-HDL Cholesterol (Calc): 166 mg/dL (calc) — ABNORMAL HIGH (ref <130)
Total CHOL/HDL Ratio: 3.6 (calc) (ref <5.0)
Triglycerides: 133 mg/dL (ref <150)

## 2018-12-23 LAB — CBC
HCT: 44.6 % (ref 38.5–50.0)
Hemoglobin: 15.3 g/dL (ref 13.2–17.1)
MCH: 31.9 pg (ref 27.0–33.0)
MCHC: 34.3 g/dL (ref 32.0–36.0)
MCV: 92.9 fL (ref 80.0–100.0)
MPV: 10.8 fL (ref 7.5–12.5)
Platelets: 224 10*3/uL (ref 140–400)
RBC: 4.8 10*6/uL (ref 4.20–5.80)
RDW: 13.1 % (ref 11.0–15.0)
WBC: 5.7 10*3/uL (ref 3.8–10.8)

## 2018-12-23 LAB — PSA, TOTAL AND FREE
PSA, % Free: 33 % (calc) (ref >25)
PSA, Free: 0.2 ng/mL
PSA, Total: 0.6 ng/mL (ref <=4.0)

## 2018-12-23 LAB — TSH: TSH: 2.36 mIU/L (ref 0.40–4.50)

## 2018-12-23 LAB — HEMOGLOBIN A1C
Hgb A1c MFr Bld: 5.1 % of total Hgb (ref <5.7)
Mean Plasma Glucose: 100 (calc)
eAG (mmol/L): 5.5 (calc)

## 2018-12-25 MED ORDER — ATORVASTATIN CALCIUM 10 MG PO TABS: 10.0000 mg | ORAL_TABLET | Freq: Every day | ORAL | 3 refills | Status: DC

## 2018-12-25 NOTE — Addendum Note (Signed)
Addended by: Silverio Decamp on: 12/25/2018 10:47 AM   Modules accepted: Orders

## 2018-12-30 ENCOUNTER — Encounter: Payer: 59 | Admitting: Sports Medicine

## 2018-12-31 LAB — COLOGUARD: Cologuard: NEGATIVE

## 2019-02-03 ENCOUNTER — Encounter: Payer: Self-pay | Admitting: Sports Medicine

## 2019-04-22 DIAGNOSIS — U071 COVID-19: Secondary | ICD-10-CM | POA: Diagnosis not present

## 2019-04-26 ENCOUNTER — Telehealth: Payer: Self-pay

## 2019-04-26 NOTE — Telephone Encounter (Signed)
Pt tested positive for COVID-19 on 04/22/2019 at [facility] and has been in quarantine since then. He states his employer needs a "work status report" completed in order for him to return to work, which he is wanting to do next Monday 05/03/2019. Will you fill out the form or do you want him to have a visit with you first?

## 2019-04-26 NOTE — Telephone Encounter (Signed)
I am happy to talk to him about it, we can do this in a virtual visit but I cannot clear him for work until at least 14 days after diagnosis.

## 2019-04-27 NOTE — Telephone Encounter (Addendum)
Pt aware of Dr. Lucienne Minks response. Call transferred to the front to schedule a MyChart video visit on 05/06/2019 which will be day #14 after diagnosis. Pt will have someone bring the form by so that we have it in office prior to the appointment for completion.

## 2019-05-06 ENCOUNTER — Ambulatory Visit (INDEPENDENT_AMBULATORY_CARE_PROVIDER_SITE_OTHER): Payer: BC Managed Care – PPO | Admitting: Sports Medicine

## 2019-05-06 DIAGNOSIS — U071 COVID-19: Secondary | ICD-10-CM | POA: Diagnosis not present

## 2019-05-06 NOTE — Assessment & Plan Note (Addendum)
Sean Serrano is a pleasant 49 year old male, he developed symptoms on January 20 and was tested positive on January 21 of this year. He is eager to get back to work, completely recovered, no symptoms, and just needs some paperwork filled out. I filled out the form which will be emailed to him.  He can return to see me as needed.

## 2019-05-06 NOTE — Progress Notes (Signed)
   Virtual Visit via WebEx/MyChart   I connected with  Sean Serrano  on 05/06/19 via WebEx/MyChart/Doximity Video and verified that I am speaking with the correct person using two identifiers.   I discussed the limitations, risks, security and privacy concerns of performing an evaluation and management service by WebEx/MyChart/Doximity Video, including the higher likelihood of inaccurate diagnosis and treatment, and the availability of in person appointments.  We also discussed the likely need of an additional face to face encounter for complete and high quality delivery of care.  I also discussed with the patient that there may be a patient responsible charge related to this service. The patient expressed understanding and wishes to proceed.  Provider location is either at home or medical facility. Patient location is at their home, different from provider location. People involved in care of the patient during this telehealth encounter were myself, my nurse/medical assistant, and my front office/scheduling team member.  Review of Systems: No fevers, chills, night sweats, weight loss, chest pain, or shortness of breath.   Objective Findings:    General: Speaking full sentences, no audible heavy breathing.  Sounds alert and appropriately interactive.  Appears well.  Face symmetric.  Extraocular movements intact.  Pupils equal and round.  No nasal flaring or accessory muscle use visualized.  Independent interpretation of tests performed by another provider:   None.  Impression and Recommendations:    COVID-19 Sean Serrano is a pleasant 49 year old male, he developed symptoms on January 20 and was tested positive on January 21 of this year. He is eager to get back to work, completely recovered, no symptoms, and just needs some paperwork filled out. I filled out the form which will be emailed to him.  He can return to see me as needed.   I discussed the above assessment and treatment plan  with the patient. The patient was provided an opportunity to ask questions and all were answered. The patient agreed with the plan and demonstrated an understanding of the instructions.   The patient was advised to call back or seek an in-person evaluation if the symptoms worsen or if the condition fails to improve as anticipated.   I provided 30 minutes of face to face and non-face-to-face time during this encounter date, time was needed to gather information, review chart, records, communicate/coordinate with staff remotely, as well as complete documentation.   ___________________________________________ Sean Serrano. Benjamin Stain, M.D., ABFM., CAQSM. Primary Care and Sports Medicine [facility]  Adjunct Instructor of Family Medicine  [employer]

## 2019-06-24 ENCOUNTER — Ambulatory Visit: Payer: BC Managed Care – PPO | Attending: Internal Medicine

## 2019-06-24 DIAGNOSIS — Z23 Encounter for immunization: Secondary | ICD-10-CM

## 2019-06-24 NOTE — Progress Notes (Signed)
   Covid-19 Vaccination Clinic  Name:  BANDY HONAKER    MRN: [id number] DOB: 1970-05-05  06/24/2019  Mr. Spooner was observed post Covid-19 immunization for 15 minutes without incident. He was provided with Vaccine Information Sheet and instruction to access the V-Safe system.   Mr. Violett was instructed to call 911 with any severe reactions post vaccine: Marland Kitchen Difficulty breathing  . Swelling of face and throat  . A fast heartbeat  . A bad rash all over body  . Dizziness and weakness   Immunizations Administered    Name Date Dose VIS Date Route   Pfizer COVID-19 Vaccine 06/24/2019 11:42 AM 0.3 mL 03/12/2019 Intramuscular   Manufacturer: [employer], [employer]   Lot: [id number]   NDC: [address]-4

## 2019-07-19 ENCOUNTER — Ambulatory Visit: Payer: BC Managed Care – PPO | Attending: Internal Medicine

## 2019-07-19 DIAGNOSIS — Z23 Encounter for immunization: Secondary | ICD-10-CM

## 2019-07-19 NOTE — Progress Notes (Signed)
   Covid-19 Vaccination Clinic  Name:  Sean Serrano    MRN: [id number] DOB: February 26, 1971  07/19/2019  Sean Serrano was observed post Covid-19 immunization for 15 minutes without incident. He was provided with Vaccine Information Sheet and instruction to access the V-Safe system.   Sean Serrano was instructed to call 911 with any severe reactions post vaccine: Marland Kitchen Difficulty breathing  . Swelling of face and throat  . A fast heartbeat  . A bad rash all over body  . Dizziness and weakness   Immunizations Administered    Name Date Dose VIS Date Route   Pfizer COVID-19 Vaccine 07/19/2019 10:30 AM 0.3 mL 05/26/2018 Intramuscular   Manufacturer: [employer], [employer]   Lot: [id number]   NDC: [address]-5

## 2019-11-04 ENCOUNTER — Other Ambulatory Visit: Payer: Self-pay | Admitting: Sports Medicine

## 2019-11-04 DIAGNOSIS — N529 Male erectile dysfunction, unspecified: Secondary | ICD-10-CM

## 2019-12-29 ENCOUNTER — Other Ambulatory Visit: Payer: Self-pay | Admitting: Sports Medicine

## 2019-12-29 DIAGNOSIS — I82461 Acute embolism and thrombosis of right calf muscular vein: Secondary | ICD-10-CM

## 2020-02-08 IMAGING — US US EXTREM LOW VENOUS*R*
1 series · 13 of 24 positions shown · non-contrast
Comparison: None.

CLINICAL DATA: Right posterior lower extremity pain and swelling
for approximately 5 days, after long car ride.



[Series 1: us extrem low venous*right* · 0.06mm/px · 13 of 40 slices shown]
[im 1/40]
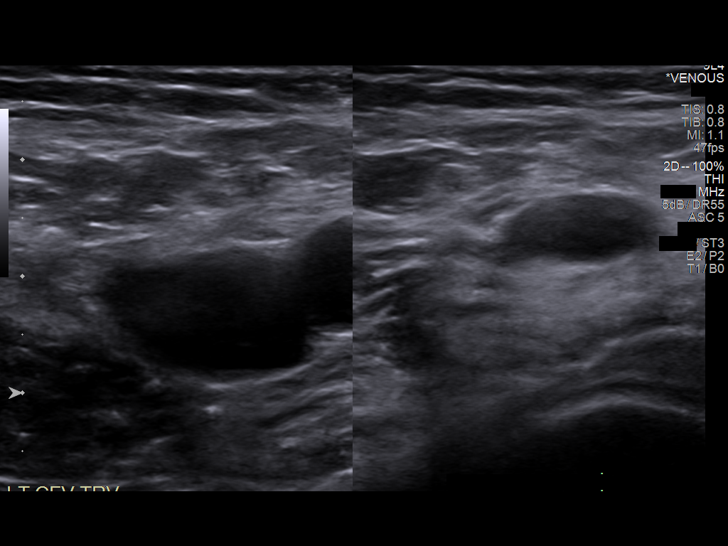
[im 4/40]
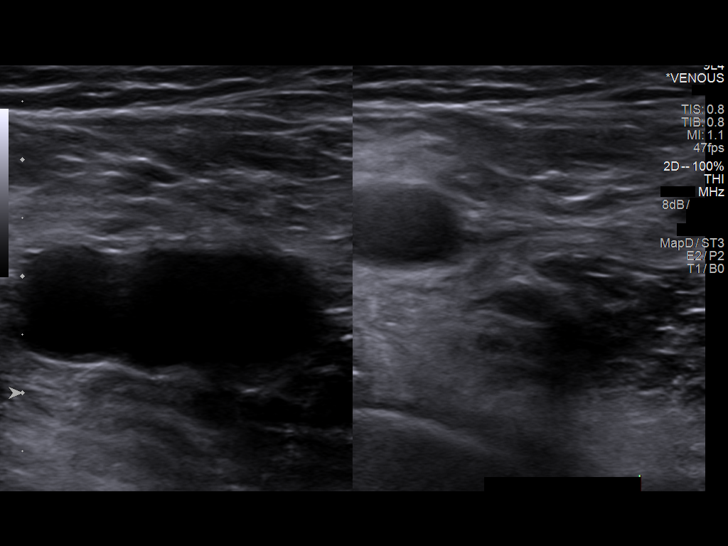
[im 7/40]
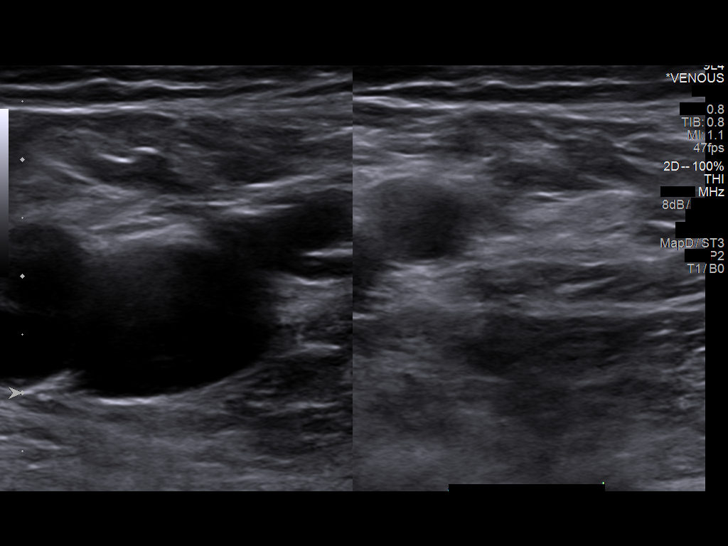
[im 11/40]
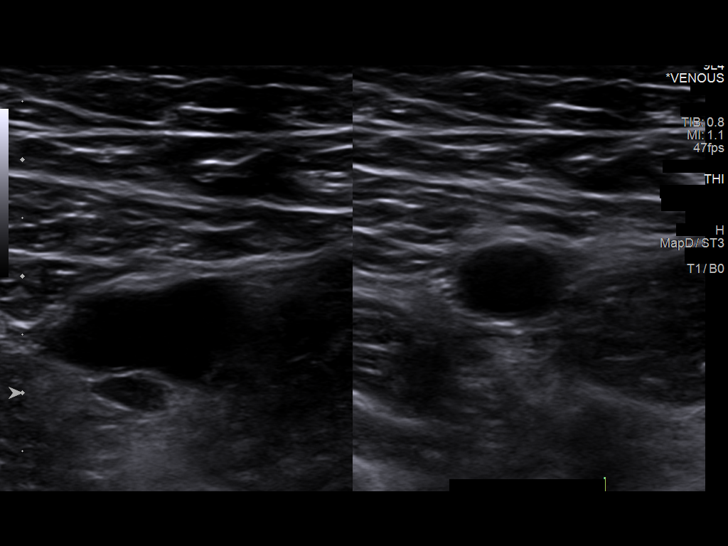
[im 14/40]
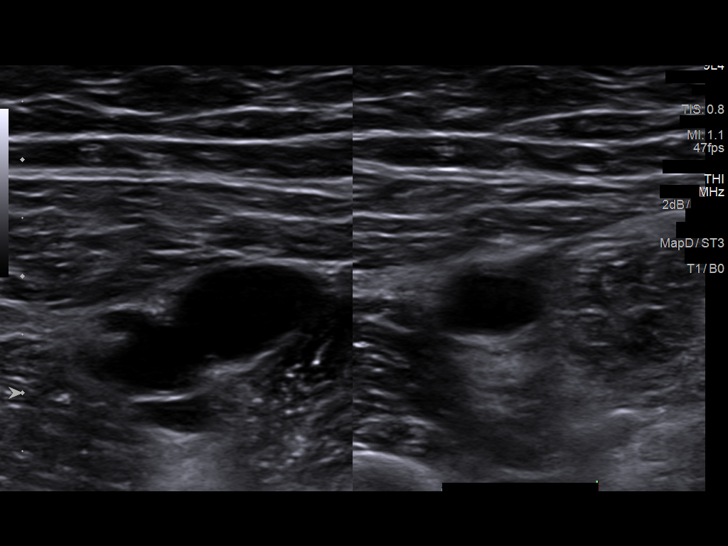
[im 17/40]
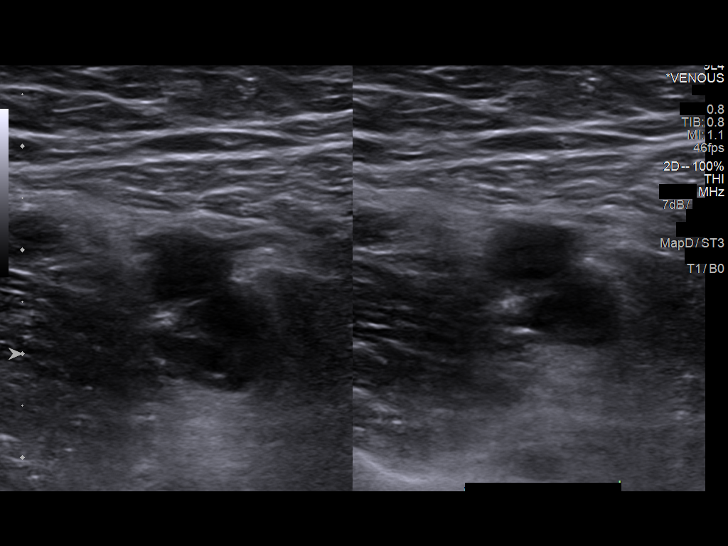
[im 21/40]
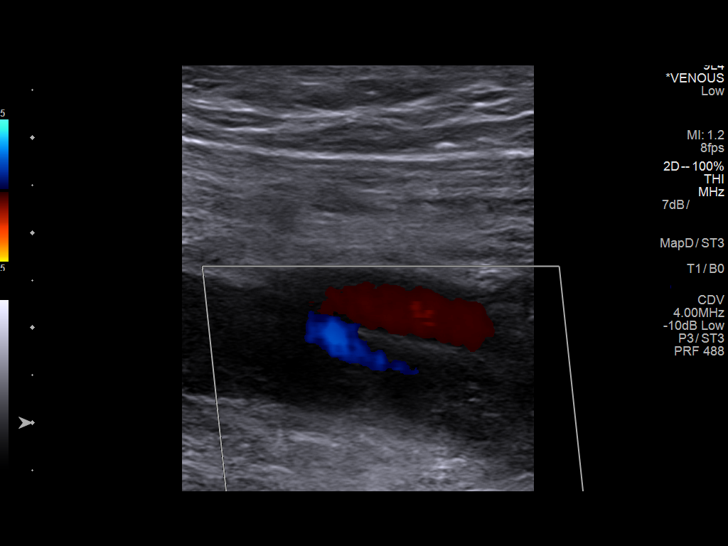
[im 23/40]
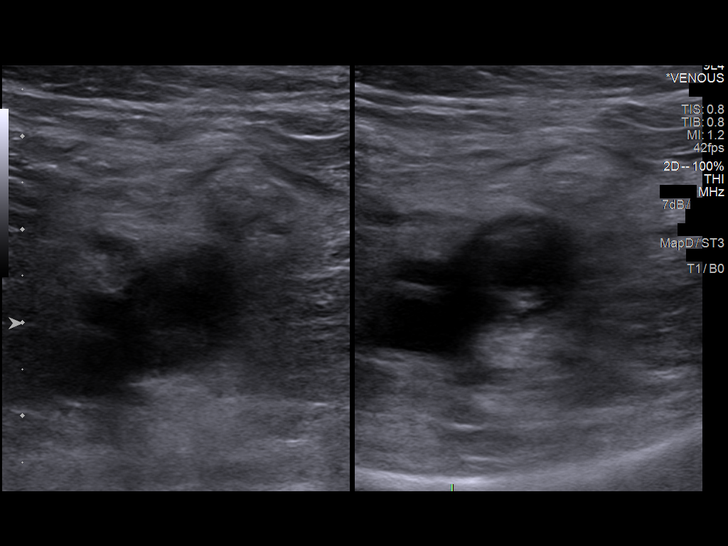
[im 26/40]
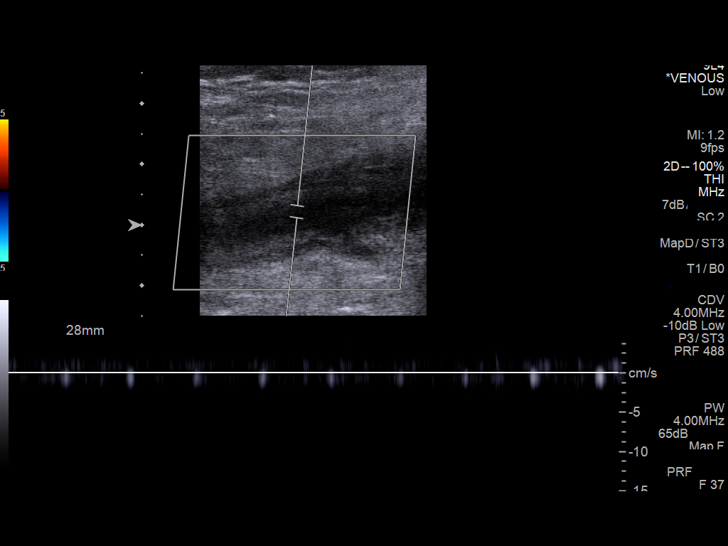
[im 29/40]
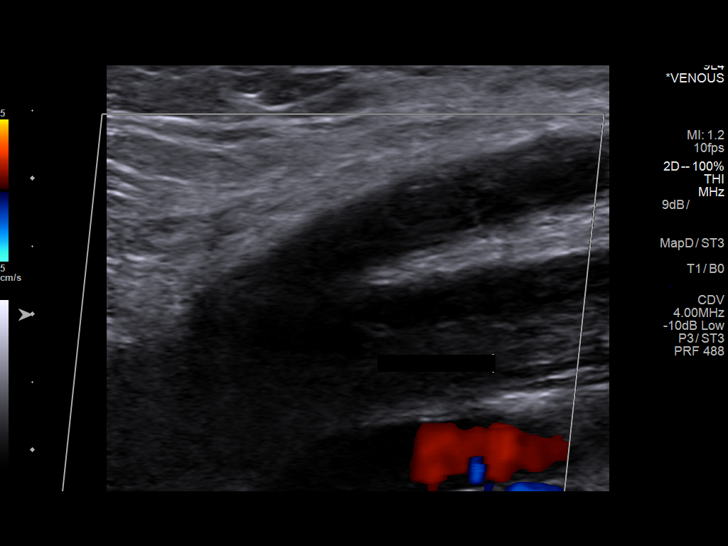
[im 33/40]
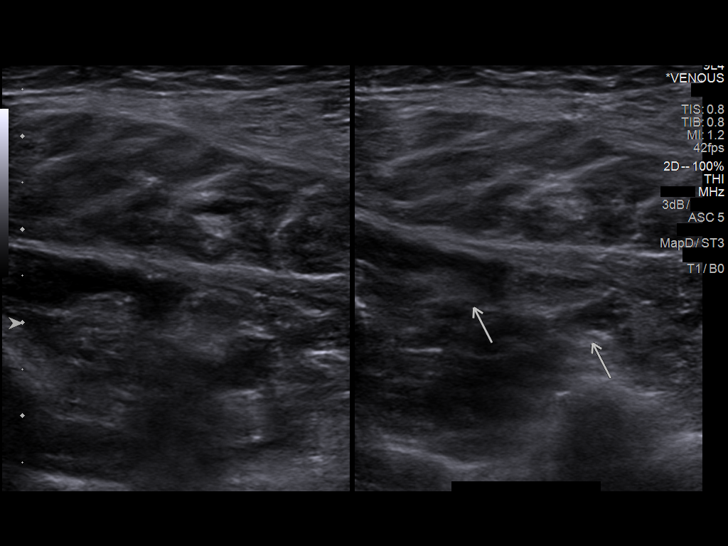
[im 36/40]
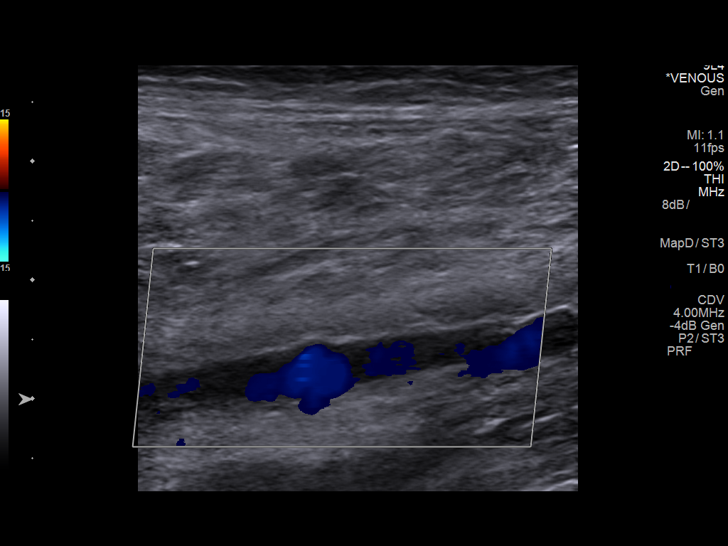
[im 40/40]
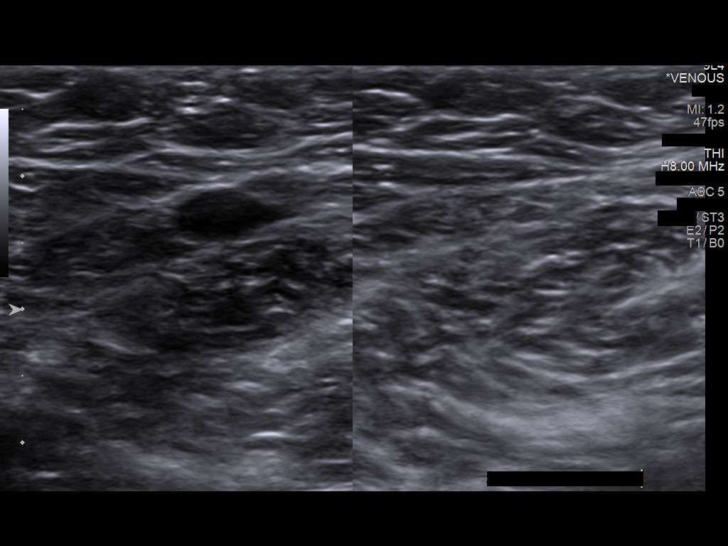

[13 of 24 positions shown; findings below may reference images not displayed]

FINDINGS: Contralateral Common Femoral Vein: Respiratory phasicity is normal
and symmetric with the symptomatic side. No evidence of thrombus.
Normal compressibility.

Common Femoral Vein: No evidence of thrombus. Normal
compressibility, respiratory phasicity and response to augmentation.

Saphenofemoral Junction: No evidence of thrombus. Normal
compressibility and flow on color Doppler imaging.

Profunda Femoral Vein: No evidence of thrombus. Normal
compressibility and flow on color Doppler imaging.

Femoral Vein: Nonocclusive thrombus is seen in the mid and distal
femoral vein.

Popliteal Vein: Occlusive thrombus is seen throughout the popliteal
vein.

Calf Veins: Nonocclusive thrombus is seen within the visualized
posterior tibial and peroneal veins.

Superficial Great Saphenous Vein: No evidence of thrombus. Normal
compressibility.

Venous Reflux:  None.

Other Findings:  None.
IMPRESSION: Positive for deep venous thrombosis in the right femoral, popliteal,
and visualized calf veins.

## 2020-02-21 ENCOUNTER — Other Ambulatory Visit: Payer: Self-pay | Admitting: Sports Medicine

## 2020-02-21 DIAGNOSIS — E785 Hyperlipidemia, unspecified: Secondary | ICD-10-CM

## 2020-04-04 DIAGNOSIS — U071 COVID-19: Secondary | ICD-10-CM | POA: Diagnosis not present

## 2020-05-12 IMAGING — US RIGHT LOWER EXTREMITY VENOUS ULTRASOUND
1 series · 13 of 24 positions shown · non-contrast
Comparison: Right lower extremity venous Doppler
ultrasound-04/10/2018

CLINICAL DATA: Acute DVT involving the right calf. Patient is
currently on anticoagulation. Evaluate for acute or chronic DVT.



[Series 1: right lower extremity venous ultrasound · 0.08mm/px · 13 of 35 slices shown]
[im 1/35]
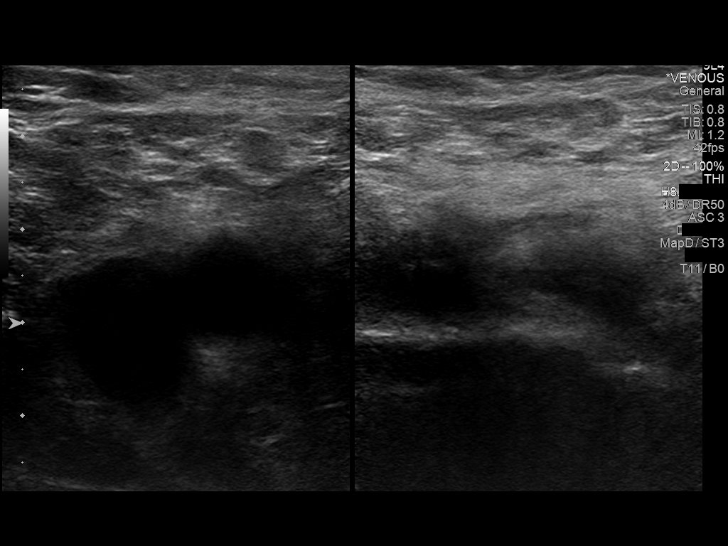
[im 3/35]
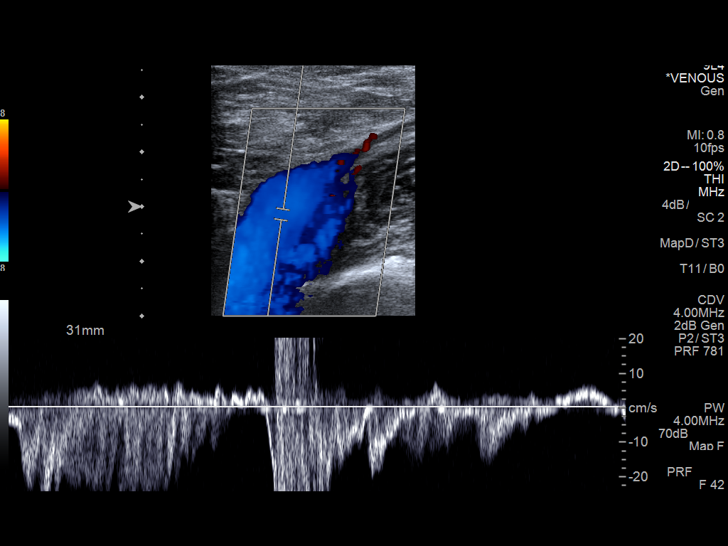
[im 6/35]
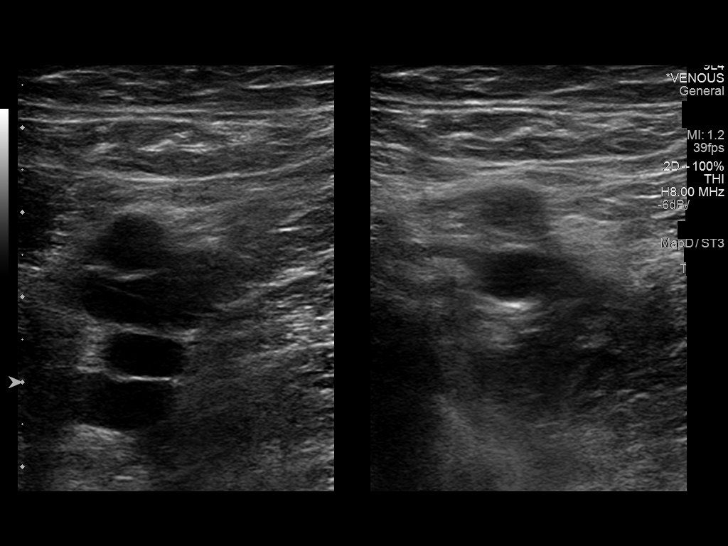
[im 9/35]
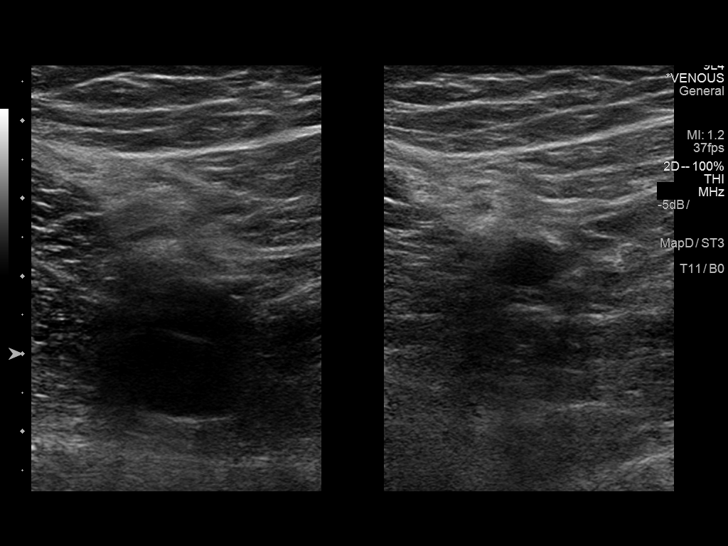
[im 12/35]
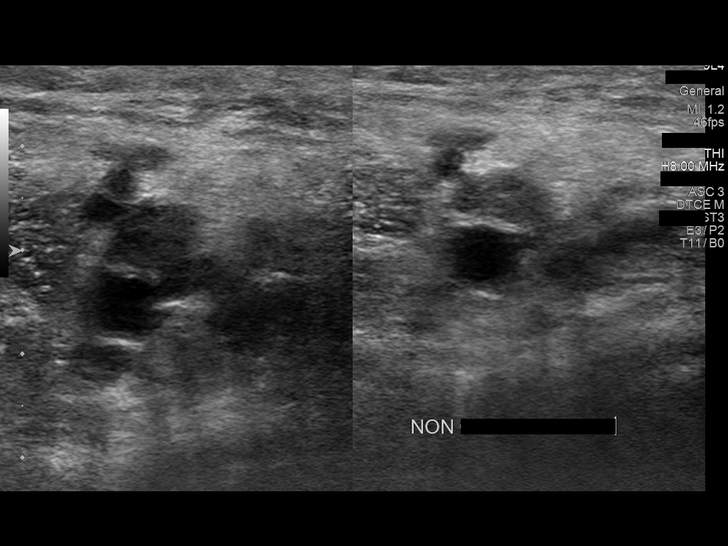
[im 15/35]
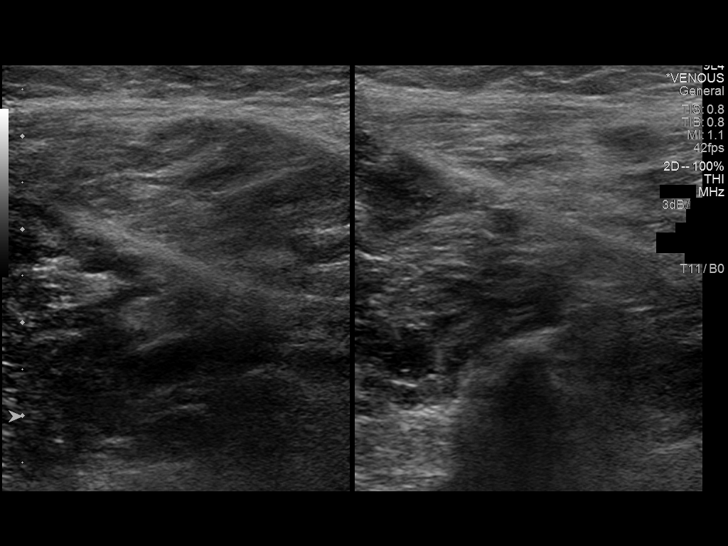
[im 18/35]
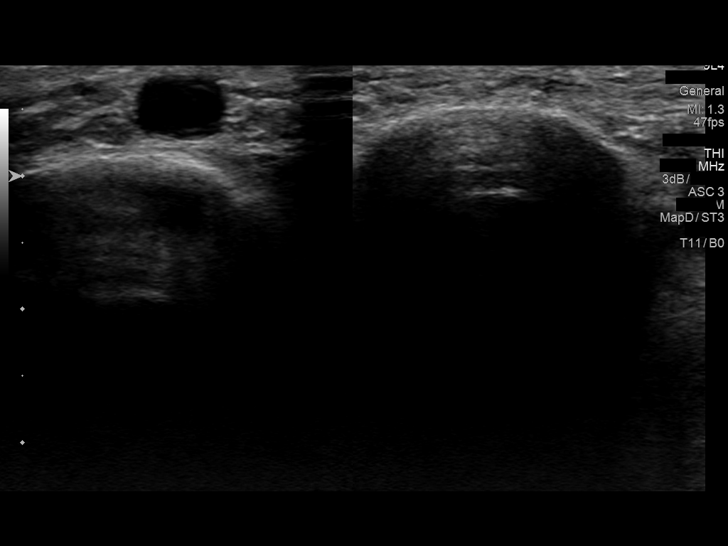
[im 20/35]
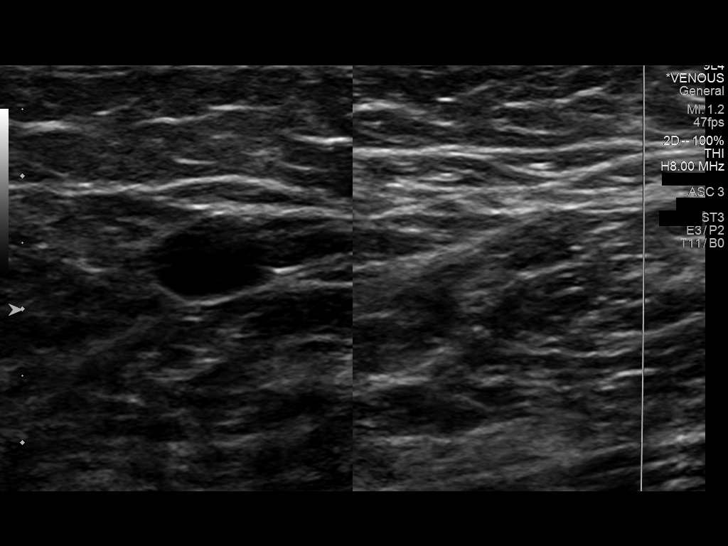
[im 23/35]
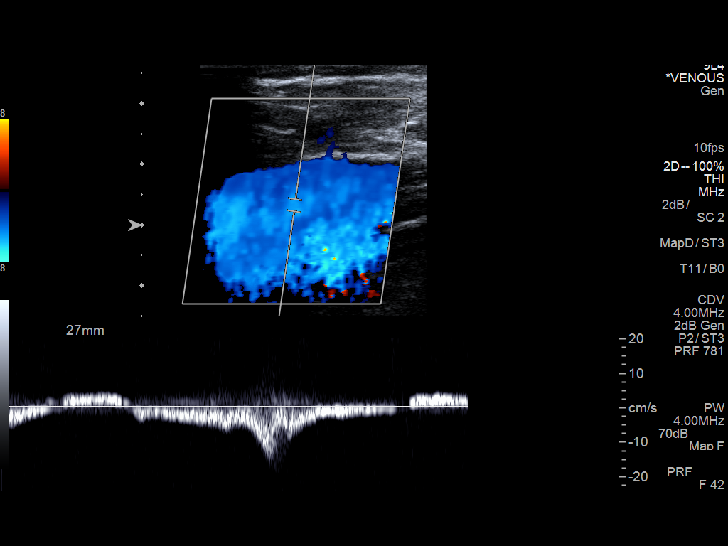
[im 26/35]
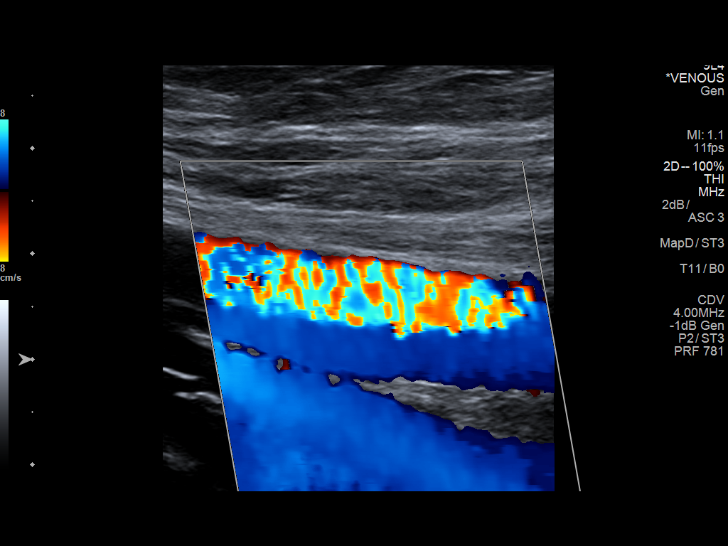
[im 29/35]
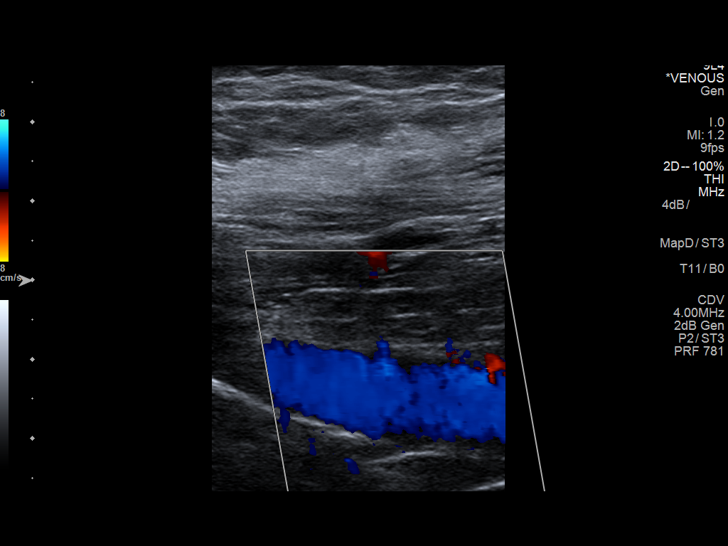
[im 32/35]
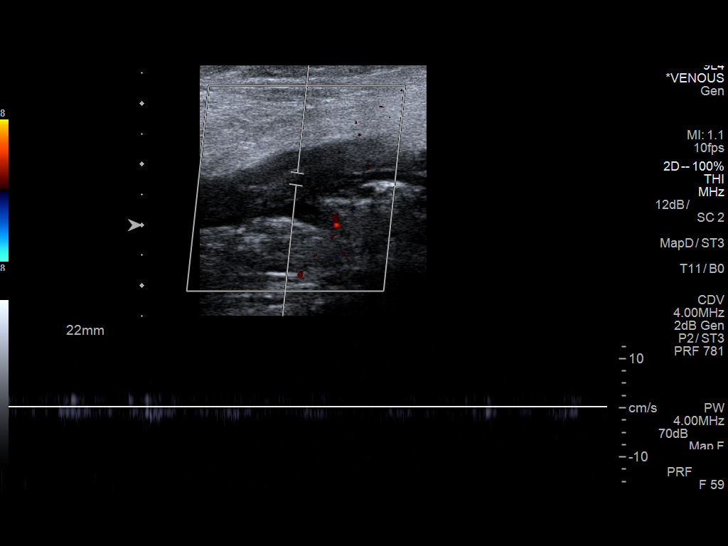
[im 35/35]
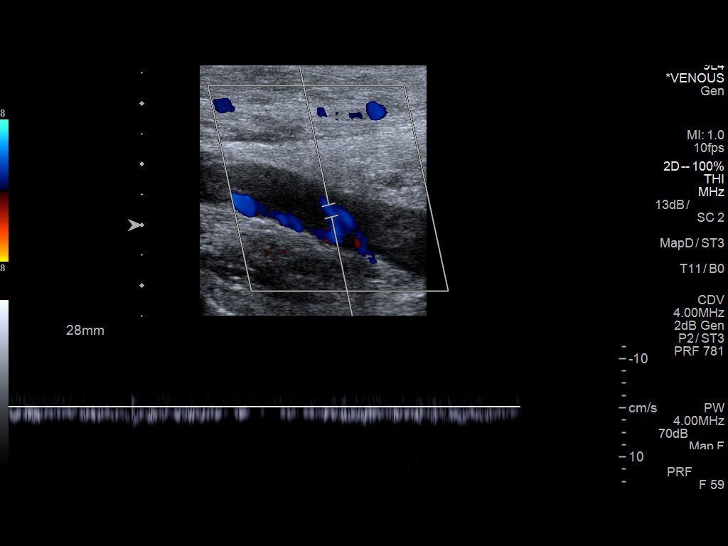

[13 of 24 positions shown; findings below may reference images not displayed]

FINDINGS: Contralateral Common Femoral Vein: Respiratory phasicity is normal
and symmetric with the symptomatic side. No evidence of thrombus.
Normal compressibility.

Common Femoral Vein: No evidence of thrombus. Normal
compressibility, respiratory phasicity and response to augmentation.

Saphenofemoral Junction: No evidence of thrombus. Normal
compressibility and flow on color Doppler imaging.

Profunda Femoral Vein: No evidence of thrombus. Normal
compressibility and flow on color Doppler imaging.

Femoral Vein: While the proximal and mid aspects the right femoral
vein appear widely patent. There is incomplete compressibility of
the distal aspect the right femoral vein compatible with wall
thickening/chronic DVT, improved compared to the [DATE] examination

Popliteal Vein: There is persistent mixed echogenic near occlusive
DVT within the right popliteal vein (image 32), grossly unchanged
compared to the 04/10/2018 examination.

Calf Veins: Appear patent where imaged.

Superficial Great Saphenous Vein: No evidence of thrombus. Normal
compressibility.

Other Findings: There is hypoechoic occlusive thrombus within the
right gastrocnemius vein (image 15), similar to the 04/10/2018
examination.
IMPRESSION: 1. No change to slight improvement of DVT affecting the distal right
femoral and popliteal veins.
2. Unchanged occlusive superficial thrombophlebitis involving the
right gastrocnemius vein.

## 2020-06-29 IMAGING — DX RIGHT HAND - COMPLETE 3+ VIEW
3 series · 3 of 3 positions shown · non-contrast
Comparison: None.

CLINICAL DATA: Dog bite, now with abrasions about the third and
fourth metacarpals.

EXAM:
RIGHT HAND - COMPLETE 3+ VIEW

[hand pa]
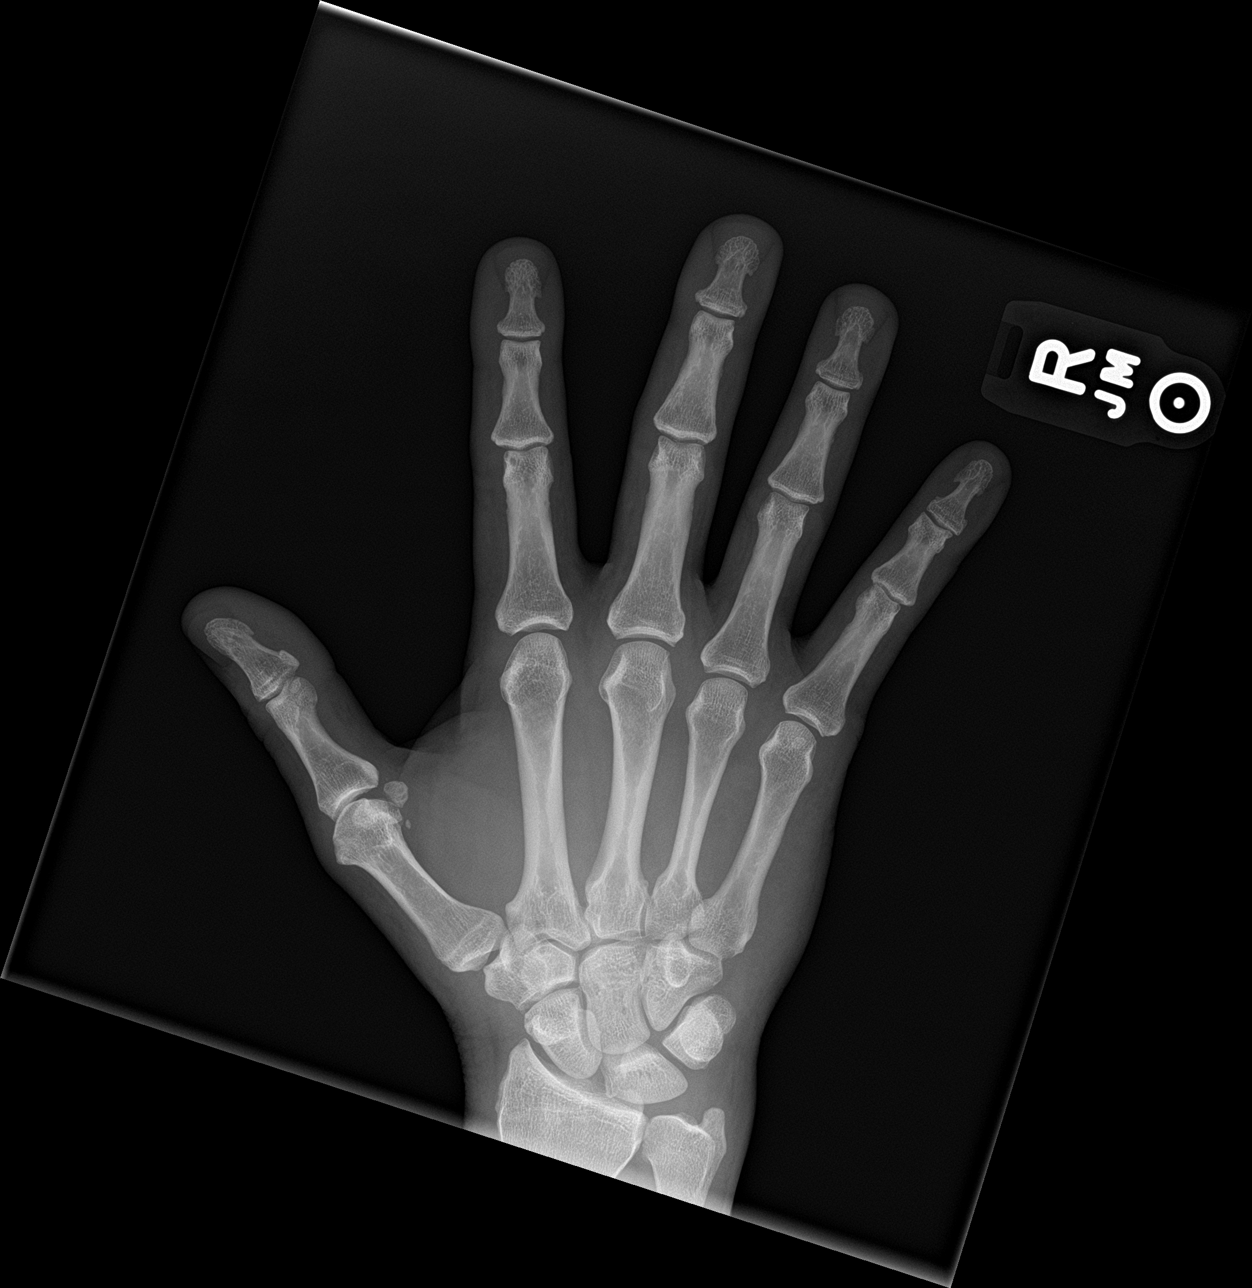

[hand obl]
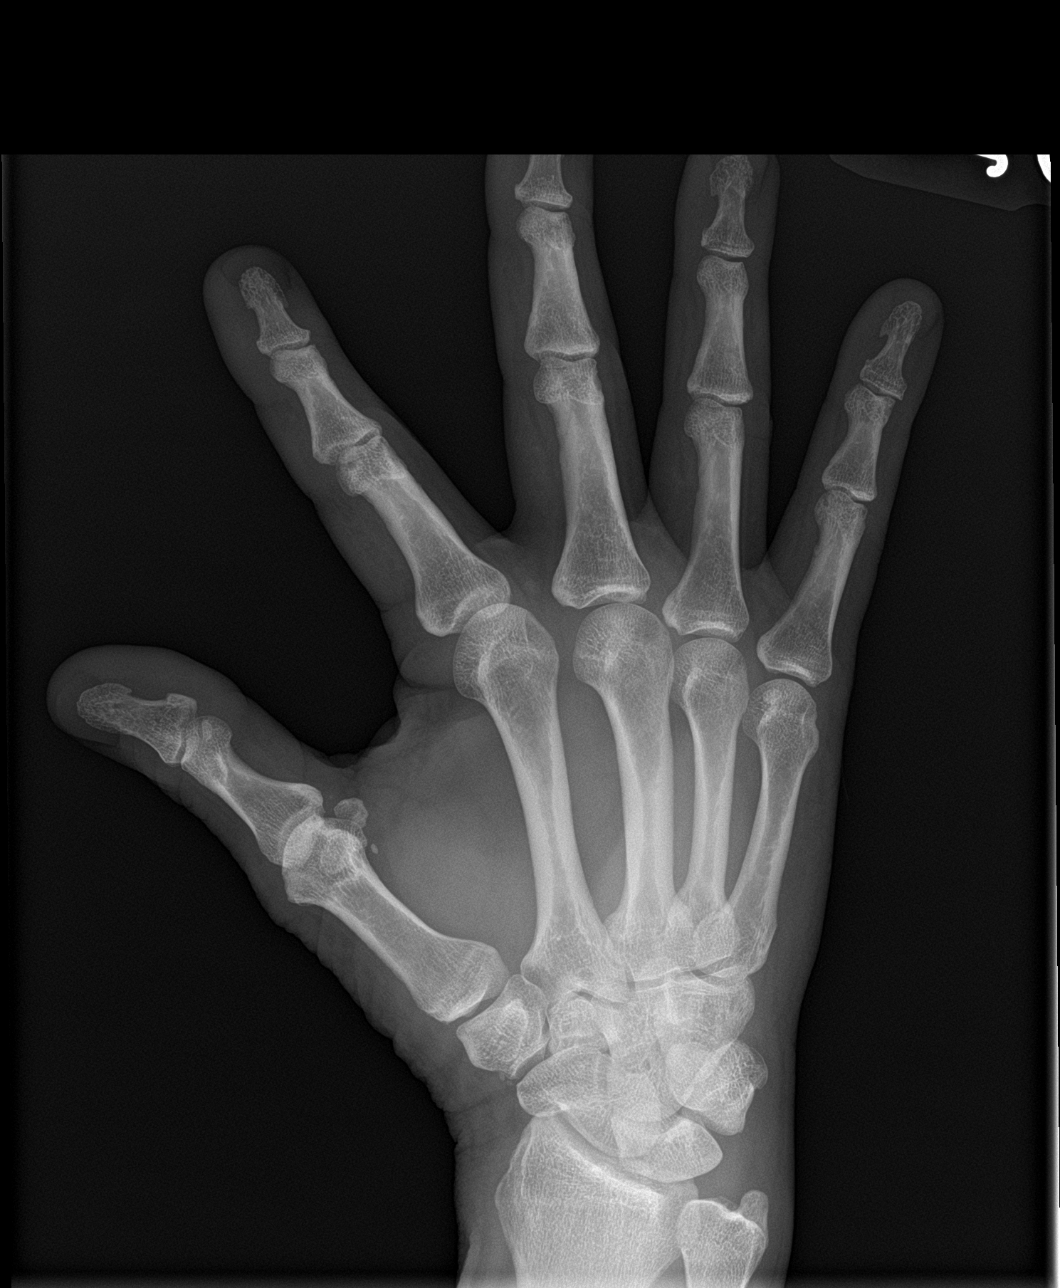

[hand lat]
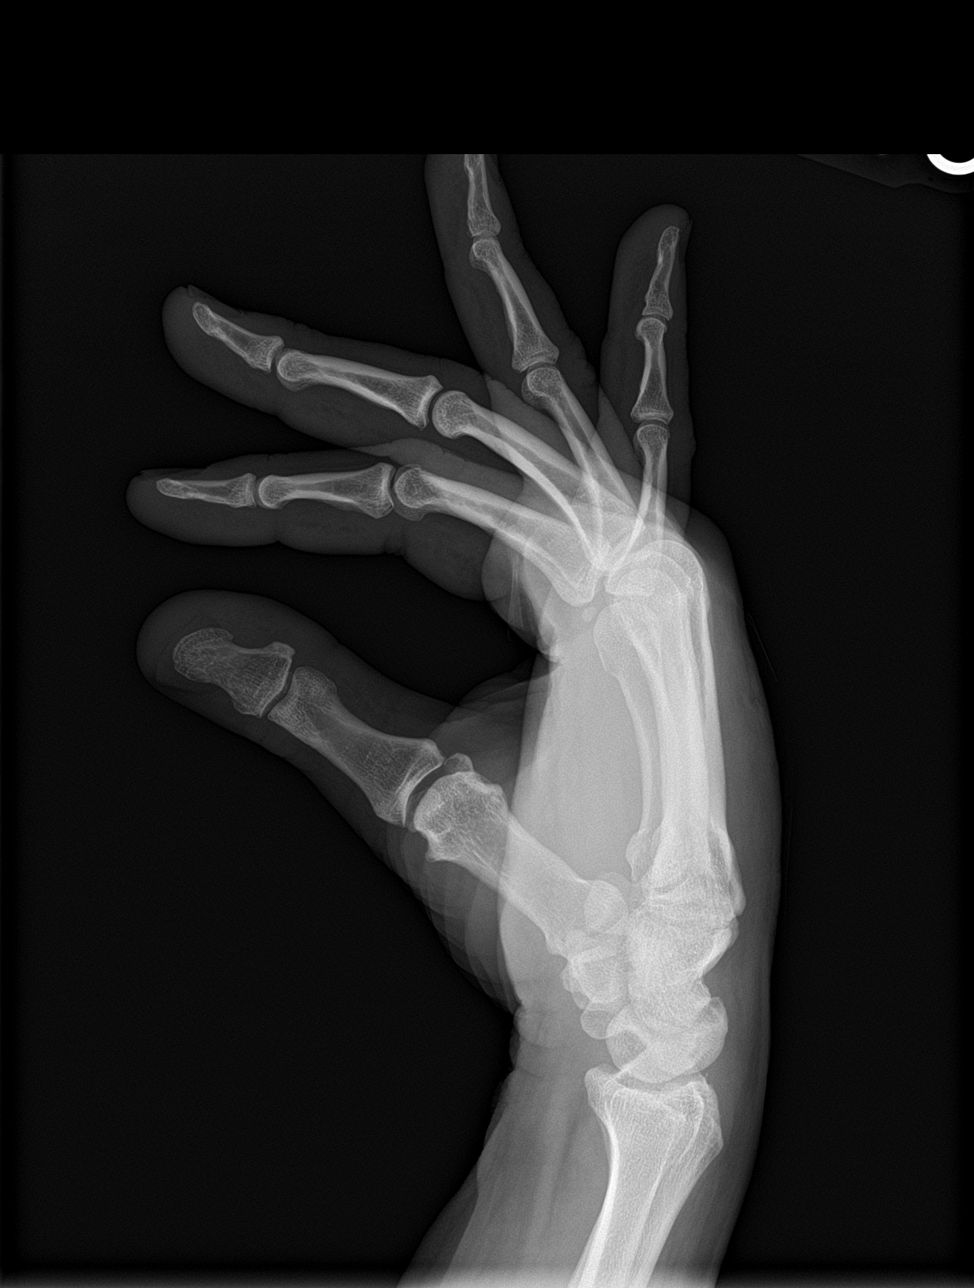

[3 of 3 positions shown; findings below may reference images not displayed]

FINDINGS: Soft tissue swelling about the dorsum of the hand. No associated
fracture or radiopaque foreign body. There is a punctate ossicle
adjacent to the head of the first meta carpal, potentially an
accessory sesamoid bone versus the sequela of remote avulsive
injury. Joint spaces are preserved. No erosions. No evidence of
chondrocalcinosis.
IMPRESSION: Soft tissue swelling about the dorsum of the hand without associated
fracture or radiopaque foreign body.

## 2020-12-05 ENCOUNTER — Telehealth: Payer: Self-pay

## 2020-12-05 DIAGNOSIS — N139 Obstructive and reflux uropathy, unspecified: Secondary | ICD-10-CM

## 2020-12-05 DIAGNOSIS — Z1159 Encounter for screening for other viral diseases: Secondary | ICD-10-CM

## 2020-12-05 DIAGNOSIS — E785 Hyperlipidemia, unspecified: Secondary | ICD-10-CM

## 2020-12-05 NOTE — Telephone Encounter (Signed)
Message to sent patient to advise .

## 2020-12-05 NOTE — Telephone Encounter (Signed)
Patient is scheduled for annual on 12/11/20. He would like to do his bloodwork this week. Please place orders.

## 2020-12-05 NOTE — Telephone Encounter (Signed)
Orders placed.

## 2020-12-11 ENCOUNTER — Ambulatory Visit (INDEPENDENT_AMBULATORY_CARE_PROVIDER_SITE_OTHER): Payer: BC Managed Care – PPO

## 2020-12-11 ENCOUNTER — Other Ambulatory Visit: Payer: Self-pay

## 2020-12-11 ENCOUNTER — Ambulatory Visit (INDEPENDENT_AMBULATORY_CARE_PROVIDER_SITE_OTHER): Payer: BC Managed Care – PPO | Admitting: Sports Medicine

## 2020-12-11 VITALS — BP 137/84 | HR 79 | Ht 69.5 in | Wt 195.0 lb

## 2020-12-11 DIAGNOSIS — M25532 Pain in left wrist: Secondary | ICD-10-CM | POA: Diagnosis not present

## 2020-12-11 DIAGNOSIS — Z0001 Encounter for general adult medical examination with abnormal findings: Secondary | ICD-10-CM

## 2020-12-11 DIAGNOSIS — M19031 Primary osteoarthritis, right wrist: Secondary | ICD-10-CM | POA: Diagnosis not present

## 2020-12-11 DIAGNOSIS — Z0000 Encounter for general adult medical examination without abnormal findings: Secondary | ICD-10-CM | POA: Diagnosis not present

## 2020-12-11 DIAGNOSIS — E785 Hyperlipidemia, unspecified: Secondary | ICD-10-CM | POA: Diagnosis not present

## 2020-12-11 DIAGNOSIS — Z23 Encounter for immunization: Secondary | ICD-10-CM

## 2020-12-11 DIAGNOSIS — N139 Obstructive and reflux uropathy, unspecified: Secondary | ICD-10-CM | POA: Diagnosis not present

## 2020-12-11 DIAGNOSIS — M25531 Pain in right wrist: Secondary | ICD-10-CM

## 2020-12-11 DIAGNOSIS — H6122 Impacted cerumen, left ear: Secondary | ICD-10-CM

## 2020-12-11 DIAGNOSIS — G8929 Other chronic pain: Secondary | ICD-10-CM

## 2020-12-11 DIAGNOSIS — N529 Male erectile dysfunction, unspecified: Secondary | ICD-10-CM | POA: Diagnosis not present

## 2020-12-11 DIAGNOSIS — I82461 Acute embolism and thrombosis of right calf muscular vein: Secondary | ICD-10-CM

## 2020-12-11 DIAGNOSIS — M19032 Primary osteoarthritis, left wrist: Secondary | ICD-10-CM | POA: Diagnosis not present

## 2020-12-11 MED ORDER — ATORVASTATIN CALCIUM 10 MG PO TABS: 10.0000 mg | ORAL_TABLET | Freq: Every day | ORAL | 3 refills | Status: DC

## 2020-12-11 MED ORDER — ASPIRIN 81 MG PO TBEC: 81.0000 mg | DELAYED_RELEASE_TABLET | Freq: Every day | ORAL | 3 refills | Status: DC

## 2020-12-11 MED ORDER — NAPROXEN 500 MG PO TABS: 500.0000 mg | ORAL_TABLET | Freq: Two times a day (BID) | ORAL | 3 refills | Status: DC

## 2020-12-11 MED ORDER — TADALAFIL 5 MG PO TABS: 5.0000 mg | ORAL_TABLET | Freq: Every day | ORAL | 11 refills | Status: DC

## 2020-12-11 NOTE — Assessment & Plan Note (Signed)
Checking PSA, he has 4 episodes of nocturia every night. Orthostasis with Flomax, we will try tadalafil.

## 2020-12-11 NOTE — Progress Notes (Signed)
Subjective:    CC: Annual Physical Exam  HPI:  This patient is here for their annual physical  I reviewed the past medical history, family history, social history, surgical history, and allergies today and no changes were needed.  Please see the problem list section below in epic for further details.  Past Medical History: Past Medical History:  Diagnosis Date   ED (erectile dysfunction)    Hyperlipidemia    Past Surgical History: Past Surgical History:  Procedure Laterality Date   ac separation     SHOULDER SURGERY     Social History: Social History   Socioeconomic History   Marital status: Married    Spouse name: Not on file   Number of children: Not on file   Years of education: Not on file   Highest education level: Not on file  Occupational History   Not on file  Tobacco Use   Smoking status: Never   Smokeless tobacco: Never  Vaping Use   Vaping Use: Never used  Substance and Sexual Activity   Alcohol use: Yes   Drug use: No   Sexual activity: Not on file  Other Topics Concern   Not on file  Social History Narrative   Not on file   Social Determinants of Health   Financial Resource Strain: Not on file  Food Insecurity: Not on file  Transportation Needs: Not on file  Physical Activity: Not on file  Stress: Not on file  Social Connections: Not on file   Family History: No family history on file. Allergies: No Known Allergies Medications: See med rec.  Review of Systems: No headache, visual changes, nausea, vomiting, diarrhea, constipation, dizziness, abdominal pain, skin rash, fevers, chills, night sweats, swollen lymph nodes, weight loss, chest pain, body aches, joint swelling, muscle aches, shortness of breath, mood changes, visual or auditory hallucinations.  Objective:    General: Well Developed, well nourished, and in no acute distress.  Neuro: Alert and oriented x3, extra-ocular muscles intact, sensation grossly intact. Cranial nerves II  through XII are intact, motor, sensory, and coordinative functions are all intact. HEENT: Normocephalic, atraumatic, pupils equal round reactive to light, neck supple, no masses, no lymphadenopathy, thyroid nonpalpable. Oropharynx, nasopharynx unremarkable, left canal with cerumen impaction, removed with instrumentation. Skin: Warm and dry, no rashes noted.  Cardiac: Regular rate and rhythm, no murmurs rubs or gallops.  Respiratory: Clear to auscultation bilaterally. Not using accessory muscles, speaking in full sentences.  Abdominal: Soft, nontender, nondistended, positive bowel sounds, no masses, no organomegaly.  Musculoskeletal: Shoulder, elbow, wrist, hip, knee, ankle stable, and with full range of motion.  Indication: Cerumen impaction of the left ear(s) Medical necessity statement: On physical examination, cerumen impairs clinically significant portions of the external auditory canal, and tympanic membrane. Noted obstructive, copious cerumen that cannot be removed without magnification and instrumentations requiring physician skills Consent: Discussed benefits and risks of procedure and verbal consent obtained Procedure: Patient was prepped for the procedure. Utilized an otoscope to assess and take note of the ear canal, the tympanic membrane, and the presence, amount, and placement of the cerumen. Gentle water irrigation and soft plastic curette was utilized to remove cerumen.  Post procedure examination: shows cerumen was completely removed. Patient tolerated procedure well. The patient is made aware that they may experience temporary vertigo, temporary hearing loss, and temporary discomfort. If these symptom last for more than 24 hours to call the clinic or proceed to the ED.  Impression and Recommendations:    The patient  was counselled, risk factors were discussed, anticipatory guidance given.  Annual physical exam Annual physical as above, Shingrix #1 today, #2 in 2 to 6 months. Not  fasting today, he will get his labs done another day.  Erectile dysfunction Having moderate relief with Viagra, he does have 4 episodes of nocturia as well, we will switch him to Cialis daily dosing. Return to see me in 6 to 8 weeks to reevaluate.  Hyperlipidemia LDL goal <160 Rechecking lipids, refilling medication.  Obstructive uropathy Checking PSA, he has 4 episodes of nocturia every night. Orthostasis with Flomax, we will try tadalafil.  Bilateral wrist pain Used to do BMX biking, he has had months to years of wrist pain, pain is localized at the radiocarpal joint, adding some x-rays, naproxen, conditioning exercises, return to see me in 6 to 8 weeks, injections if no better.  Hearing loss due to cerumen impaction, left Removed with instrumentation in the office today. Return as needed for this.   ___________________________________________ [name]. Benjamin Stain, M.D., ABFM., CAQSM. Primary Care and Sports Medicine [facility]  Adjunct Professor of Family Medicine  [employer]

## 2020-12-11 NOTE — Assessment & Plan Note (Signed)
Removed with instrumentation in the office today. Return as needed for this.

## 2020-12-11 NOTE — Assessment & Plan Note (Signed)
Rechecking lipids, refilling medication.

## 2020-12-11 NOTE — Assessment & Plan Note (Signed)
Annual physical as above, Shingrix #1 today, #2 in 2 to 6 months. Not fasting today, he will get his labs done another day.

## 2020-12-11 NOTE — Assessment & Plan Note (Signed)
Having moderate relief with Viagra, he does have 4 episodes of nocturia as well, we will switch him to Cialis daily dosing. Return to see me in 6 to 8 weeks to reevaluate.

## 2020-12-11 NOTE — Assessment & Plan Note (Signed)
Used to do BMX biking, he has had months to years of wrist pain, pain is localized at the radiocarpal joint, adding some x-rays, naproxen, conditioning exercises, return to see me in 6 to 8 weeks, injections if no better.

## 2020-12-13 DIAGNOSIS — E785 Hyperlipidemia, unspecified: Secondary | ICD-10-CM | POA: Diagnosis not present

## 2020-12-13 DIAGNOSIS — N139 Obstructive and reflux uropathy, unspecified: Secondary | ICD-10-CM | POA: Diagnosis not present

## 2020-12-13 DIAGNOSIS — Z1159 Encounter for screening for other viral diseases: Secondary | ICD-10-CM | POA: Diagnosis not present

## 2020-12-14 LAB — CBC
HCT: 42.8 % (ref 38.5–50.0)
Hemoglobin: 14.9 g/dL (ref 13.2–17.1)
MCH: 32.7 pg (ref 27.0–33.0)
MCHC: 34.8 g/dL (ref 32.0–36.0)
MCV: 93.9 fL (ref 80.0–100.0)
MPV: 10.7 fL (ref 7.5–12.5)
Platelets: 206 10*3/uL (ref 140–400)
RBC: 4.56 10*6/uL (ref 4.20–5.80)
RDW: 12.9 % (ref 11.0–15.0)
WBC: 6.2 10*3/uL (ref 3.8–10.8)

## 2020-12-14 LAB — COMPREHENSIVE METABOLIC PANEL
AG Ratio: 1.7 (calc) (ref 1.0–2.5)
ALT: 23 U/L (ref 9–46)
AST: 16 U/L (ref 10–35)
Albumin: 4 g/dL (ref 3.6–5.1)
Alkaline phosphatase (APISO): 46 U/L (ref 35–144)
BUN: 21 mg/dL (ref 7–25)
CO2: 26 mmol/L (ref 20–32)
Calcium: 8.8 mg/dL (ref 8.6–10.3)
Chloride: 107 mmol/L (ref 98–110)
Creat: 1 mg/dL (ref 0.70–1.30)
Globulin: 2.4 g/dL (calc) (ref 1.9–3.7)
Glucose, Bld: 93 mg/dL (ref 65–99)
Potassium: 4.3 mmol/L (ref 3.5–5.3)
Sodium: 140 mmol/L (ref 135–146)
Total Bilirubin: 0.5 mg/dL (ref 0.2–1.2)
Total Protein: 6.4 g/dL (ref 6.1–8.1)

## 2020-12-14 LAB — LIPID PANEL
Cholesterol: 164 mg/dL (ref <200)
HDL: 53 mg/dL (ref >=40)
LDL Cholesterol (Calc): 90 mg/dL (calc)
Non-HDL Cholesterol (Calc): 111 mg/dL (calc) (ref <130)
Total CHOL/HDL Ratio: 3.1 (calc) (ref <5.0)
Triglycerides: 109 mg/dL (ref <150)

## 2020-12-14 LAB — PSA, TOTAL AND FREE
PSA, % Free: 50 % (calc) (ref >25)
PSA, Free: 0.3 ng/mL
PSA, Total: 0.6 ng/mL (ref <=4.0)

## 2020-12-14 LAB — TSH: TSH: 3.1 mIU/L (ref 0.40–4.50)

## 2020-12-14 LAB — HEPATITIS C ANTIBODY
Hepatitis C Ab: NONREACTIVE
SIGNAL TO CUT-OFF: 0.01 (ref <1.00)

## 2021-02-05 ENCOUNTER — Other Ambulatory Visit: Payer: Self-pay

## 2021-02-05 ENCOUNTER — Ambulatory Visit: Payer: BC Managed Care – PPO | Admitting: Sports Medicine

## 2021-02-05 DIAGNOSIS — M25532 Pain in left wrist: Secondary | ICD-10-CM | POA: Diagnosis not present

## 2021-02-05 DIAGNOSIS — L989 Disorder of the skin and subcutaneous tissue, unspecified: Secondary | ICD-10-CM | POA: Diagnosis not present

## 2021-02-05 DIAGNOSIS — M25531 Pain in right wrist: Secondary | ICD-10-CM | POA: Diagnosis not present

## 2021-02-05 MED ORDER — DOXYCYCLINE HYCLATE 100 MG PO TABS: 100.0000 mg | ORAL_TABLET | Freq: Two times a day (BID) | ORAL | 0 refills | Status: AC

## 2021-02-05 NOTE — Progress Notes (Signed)
    Procedures performed today:    None.  Independent interpretation of notes and tests performed by another provider:   None.  Brief History, Exam, Impression, and Recommendations:    Skin lesion of face Pleasant 50 year old male, he has had over a 1 month history of a lesion on his chin, left-sided, tends to bleed easily when shaving. On exam it looks like more of an abrasion, there is some surrounding erythema, nontender though. We are to treat this for a week with doxycycline and he will avoid shaving with a razor, I have asked him to use an electric shaver in the meantime to rule out simple repetitive trauma. If persistence after a month we will do a shave biopsy for concern of squamous cell carcinoma.  Bilateral wrist pain Resolved with naproxen and some conditioning exercises, return as needed.    ___________________________________________ Sean Serrano. Benjamin Stain, M.D., ABFM., CAQSM. Primary Care and Sports Medicine [facility]  Adjunct Instructor of Family Medicine  [employer]

## 2021-02-05 NOTE — Assessment & Plan Note (Signed)
Pleasant 50 year old male, he has had over a 1 month history of a lesion on his chin, left-sided, tends to bleed easily when shaving. On exam it looks like more of an abrasion, there is some surrounding erythema, nontender though. We are to treat this for a week with doxycycline and he will avoid shaving with a razor, I have asked him to use an electric shaver in the meantime to rule out simple repetitive trauma. If persistence after a month we will do a shave biopsy for concern of squamous cell carcinoma.

## 2021-02-05 NOTE — Assessment & Plan Note (Signed)
Resolved with naproxen and some conditioning exercises, return as needed.

## 2021-03-05 ENCOUNTER — Ambulatory Visit: Payer: BC Managed Care – PPO | Admitting: Sports Medicine

## 2021-03-05 ENCOUNTER — Ambulatory Visit: Payer: BC Managed Care – PPO

## 2021-03-05 ENCOUNTER — Other Ambulatory Visit: Payer: Self-pay

## 2021-03-05 DIAGNOSIS — Z0000 Encounter for general adult medical examination without abnormal findings: Secondary | ICD-10-CM

## 2021-03-05 DIAGNOSIS — L989 Disorder of the skin and subcutaneous tissue, unspecified: Secondary | ICD-10-CM

## 2021-03-05 MED ORDER — SHINGRIX 50 MCG/0.5ML IM SUSR: 0.5000 mL | Freq: Once | INTRAMUSCULAR | 0 refills | Status: AC

## 2021-03-05 NOTE — Progress Notes (Signed)
    Procedures performed today:    None.  Independent interpretation of notes and tests performed by another provider:   None.  Brief History, Exam, Impression, and Recommendations:    Skin lesion of face Skin lesion did resolve with the addition of doxycycline and then the switch from a bladed razor to an electric trimmer. If he does decide to go back to a regular bladed razor he will be sure to use some sort of alcohol sterilize skin afterwards to avoid what seems to have been a folliculitis.  Annual physical exam Sean Serrano has also had a single Shingrix vaccination, we are out here at the office so I will send a prescription to his pharmacy.    ___________________________________________ Sean Serrano. Sean Serrano, M.D., ABFM., CAQSM. Primary Care and Sports Medicine [facility]  Adjunct Instructor of Family Medicine  [employer]

## 2021-03-05 NOTE — Assessment & Plan Note (Signed)
Sean Serrano has also had a single Shingrix vaccination, we are out here at the office so I will send a prescription to his pharmacy.

## 2021-03-05 NOTE — Assessment & Plan Note (Signed)
Skin lesion did resolve with the addition of doxycycline and then the switch from a bladed razor to an electric trimmer. If he does decide to go back to a regular bladed razor he will be sure to use some sort of alcohol sterilize skin afterwards to avoid what seems to have been a folliculitis.

## 2021-03-07 DIAGNOSIS — R519 Headache, unspecified: Secondary | ICD-10-CM | POA: Diagnosis not present

## 2021-04-13 ENCOUNTER — Other Ambulatory Visit: Payer: Self-pay | Admitting: Sports Medicine

## 2021-04-13 DIAGNOSIS — M25531 Pain in right wrist: Secondary | ICD-10-CM

## 2021-04-13 DIAGNOSIS — M25532 Pain in left wrist: Secondary | ICD-10-CM

## 2021-08-12 ENCOUNTER — Other Ambulatory Visit: Payer: Self-pay | Admitting: Sports Medicine

## 2021-08-12 DIAGNOSIS — M25531 Pain in right wrist: Secondary | ICD-10-CM

## 2021-09-29 ENCOUNTER — Emergency Department
Admission: EM | Admit: 2021-09-29 | Discharge: 2021-09-29 | Disposition: A | Discharge status: Home / Self Care | Payer: BC Managed Care – PPO | Source: Home / Self Care

## 2021-09-29 ENCOUNTER — Emergency Department (INDEPENDENT_AMBULATORY_CARE_PROVIDER_SITE_OTHER): Payer: BC Managed Care – PPO

## 2021-09-29 ENCOUNTER — Other Ambulatory Visit: Payer: Self-pay

## 2021-09-29 DIAGNOSIS — M545 Low back pain, unspecified: Secondary | ICD-10-CM | POA: Diagnosis not present

## 2021-09-29 DIAGNOSIS — S39012A Strain of muscle, fascia and tendon of lower back, initial encounter: Secondary | ICD-10-CM

## 2021-09-29 DIAGNOSIS — M5432 Sciatica, left side: Secondary | ICD-10-CM | POA: Diagnosis not present

## 2021-09-29 DIAGNOSIS — M5431 Sciatica, right side: Secondary | ICD-10-CM

## 2021-09-29 MED ORDER — PREDNISONE 10 MG (21) PO TBPK: ORAL_TABLET | Freq: Every day | ORAL | 0 refills | Status: DC

## 2021-09-29 MED ORDER — METHYLPREDNISOLONE SODIUM SUCC 125 MG IJ SOLR
125.0000 mg | Freq: Once | INTRAMUSCULAR | Status: AC
  Administered 2021-09-29: 125 mg via INTRAMUSCULAR

## 2021-09-29 MED ORDER — METHOCARBAMOL 500 MG PO TABS: 500.0000 mg | ORAL_TABLET | Freq: Three times a day (TID) | ORAL | 0 refills | Status: AC | PRN

## 2021-09-29 NOTE — ED Triage Notes (Signed)
Pt presents to Urgent Care with c/o bilateral posterior leg pain since 09/20/21. Reports hx of low back pain and sciatica.

## 2021-09-29 NOTE — ED Provider Notes (Signed)
[facility]    CSN: [id number] Arrival date & time: 09/29/21  0910      History   Chief Complaint Chief Complaint  Patient presents with   Leg Pain    HPI Sean Serrano is a 51 y.o. male.   HPI Pleasant 51 year old male presents with bilateral lower back/posterior leg pain since 09/20/2021.  Patient denies injury or insult to lower back.  Patient reports history of low back pain, previous compression fractures with sciatica.  PMH significant for DVT, obstructive uropathy, and HLD.  Past Medical History:  Diagnosis Date   ED (erectile dysfunction)    Hyperlipidemia     Patient Active Problem List   Diagnosis Date Noted   Skin lesion of face 02/05/2021   Bilateral wrist pain 12/11/2020   Hearing loss due to cerumen impaction, left 12/11/2020   Acute deep vein thrombosis (DVT) of calf muscle vein of right lower extremity (HCC) 04/13/2018   Erectile dysfunction 12/03/2016   Hyperlipidemia LDL goal <160 12/03/2016   Annual physical exam 09/14/2013   Obstructive uropathy 09/14/2013    Past Surgical History:  Procedure Laterality Date   ac separation     SHOULDER SURGERY         Home Medications    Prior to Admission medications   Medication Sig Start Date End Date Taking? Authorizing Provider  methocarbamol (ROBAXIN) 500 MG tablet Take 1 tablet (500 mg total) by mouth 3 (three) times daily as needed for up to 7 days for muscle spasms. 09/29/21 10/06/21 Yes Trevor Iha, FNP  predniSONE (STERAPRED UNI-PAK 21 TAB) 10 MG (21) TBPK tablet Take by mouth daily. Take 6 tabs by mouth daily  for 2 days, then 5 tabs for 2 days, then 4 tabs for 2 days, then 3 tabs for 2 days, 2 tabs for 2 days, then 1 tab by mouth daily for 2 days 09/29/21  Yes Trevor Iha, FNP  aspirin (ASPIRIN LOW DOSE) 81 MG EC tablet Take 1 tablet (81 mg total) by mouth daily. Swallow whole. 12/11/20   Monica Becton, MD  atorvastatin (LIPITOR) 10 MG tablet Take 1 tablet (10 mg total) by  mouth daily. 12/11/20   Monica Becton, MD  naproxen (NAPROSYN) 500 MG tablet TAKE 1 TABLET BY MOUTH 2 TIMES DAILY WITH A MEAL. 08/13/21   Monica Becton, MD  tadalafil (CIALIS) 5 MG tablet Take 1 tablet (5 mg total) by mouth daily. 12/11/20   Monica Becton, MD    Family History Family History  Problem Relation Age of Onset   Healthy Mother     Social History Social History   Tobacco Use   Smoking status: Never   Smokeless tobacco: Never  Vaping Use   Vaping Use: Never used  Substance Use Topics   Alcohol use: Not Currently    Alcohol/week: 12.0 standard drinks of alcohol    Types: 12 Cans of beer per week    Comment: weekly   Drug use: No     Allergies   Patient has no known allergies.   Review of Systems Review of Systems  Musculoskeletal:  Positive for back pain.     Physical Exam Triage Vital Signs ED Triage Vitals  Enc Vitals Group     BP 09/29/21 0931 (!) 138/91     Pulse Rate 09/29/21 0931 74     Resp 09/29/21 0931 20     Temp 09/29/21 0931 97.9 F (36.6 C)     Temp Source 09/29/21  0931 Oral     SpO2 09/29/21 0931 98 %     Weight 09/29/21 0928 185 lb (83.9 kg)     Height 09/29/21 0928 5\' 9"  (1.753 m)     Head Circumference --      Peak Flow --      Pain Score 09/29/21 0927 8     Pain Loc --      Pain Edu? --      Excl. in GC? --    No data found.  Updated Vital Signs BP (!) 138/91 (BP Location: Right Arm)   Pulse 74   Temp 97.9 F (36.6 C) (Oral)   Resp 20   Ht 5\' 9"  (1.753 m)   Wt 185 lb (83.9 kg)   SpO2 98%   BMI 27.32 kg/m    Physical Exam Vitals and nursing note reviewed.  Constitutional:      General: He is not in acute distress.    Appearance: Normal appearance. He is normal weight. He is not ill-appearing.  HENT:     Head: Normocephalic and atraumatic.     Mouth/Throat:     Mouth: Mucous membranes are moist.     Pharynx: Oropharynx is clear.  Eyes:     Extraocular Movements: Extraocular movements  intact.     Conjunctiva/sclera: Conjunctivae normal.     Pupils: Pupils are equal, round, and reactive to light.  Cardiovascular:     Rate and Rhythm: Normal rate and regular rhythm.     Pulses: Normal pulses.     Heart sounds: Normal heart sounds.  Pulmonary:     Effort: Pulmonary effort is normal.     Breath sounds: Normal breath sounds. No wheezing, rhonchi or rales.  Musculoskeletal:     Cervical back: Normal range of motion and neck supple.  Skin:    General: Skin is warm and dry.  Neurological:     General: No focal deficit present.     Mental Status: He is alert and oriented to person, place, and time.      UC Treatments / Results  Labs (all labs ordered are listed, but only abnormal results are displayed) Labs Reviewed - No data to display  EKG   Radiology DG Lumbar Spine Complete  Result Date: 09/29/2021 CLINICAL DATA:  Lower back pain radiating into the lower extremities. Patient reports a remote back fracture at age 38. EXAM: LUMBAR SPINE - COMPLETE 4+ VIEW COMPARISON:  None Available. FINDINGS: Wedge-shaped fracture of L1, which appears chronic. No other fractures. No spondylolisthesis. Mild loss of disc height at L4-L5. Remaining lumbar disc spaces are well preserved. Minor anterior endplate osteophytes. Facet joint degenerative change on the left at L4-L5 and L5-S1. Soft tissues are unremarkable. IMPRESSION: 1. Wedge-shaped compression fracture of L1 that appears chronic and is likely the remote injury patient describes in the history. 2. No other fractures. 3. Mild disc degenerative changes at L4-L5 with left lower lumbar spine facet degenerative change. Electronically Signed   By: 11/30/2021 M.D.   On: 09/29/2021 10:11    Procedures Procedures (including critical care time)  Medications Ordered in UC Medications  methylPREDNISolone sodium succinate (SOLU-MEDROL) 125 mg/2 mL injection 125 mg (125 mg Intramuscular Given 09/29/21 1022)    Initial Impression /  Assessment and Plan / UC Course  I have reviewed the triage vital signs and the nursing notes.  Pertinent labs & imaging results that were available during my care of the patient were reviewed by me and considered in  my medical decision making (see chart for details).     MDM: 1. Bilateral sciatica-x-ray results of lumbar spine reveal above, Solu-Medrol 125 mg given once in clinic prior to discharge; 2.  Lumbar strain-Rx'd Sterapred uni-pack, Robaxin. Advised patient of LS spine x-ray results with hard copy provided to patient.  Instructed patient to take medication as directed with food to completion.  Advised patient may take Robaxin daily or as needed for accompanying muscle spasms.  Encouraged patient to increase daily water intake while taking these medications.  Advised patient if symptoms worsen and/or unresolved please follow-up with PCP or here for further evaluation.  Patient discharged home, hemodynamically stable.  Final Clinical Impressions(s) / UC Diagnoses   Final diagnoses:  Bilateral sciatica  Lumbar strain, initial encounter     Discharge Instructions      Advised patient of LS spine x-ray results with hard copy provided to patient.  Instructed patient to take medication as directed with food to completion.  Advised patient may take Robaxin daily or as needed for accompanying muscle spasms.  Encouraged patient to increase daily water intake while taking these medications.  Advised patient if symptoms worsen and/or unresolved please follow-up with PCP or here for further evaluation.     ED Prescriptions     Medication Sig Dispense Auth. Provider   predniSONE (STERAPRED UNI-PAK 21 TAB) 10 MG (21) TBPK tablet Take by mouth daily. Take 6 tabs by mouth daily  for 2 days, then 5 tabs for 2 days, then 4 tabs for 2 days, then 3 tabs for 2 days, 2 tabs for 2 days, then 1 tab by mouth daily for 2 days 42 tablet Trevor Iha, FNP   methocarbamol (ROBAXIN) 500 MG tablet Take 1  tablet (500 mg total) by mouth 3 (three) times daily as needed for up to 7 days for muscle spasms. 30 tablet Trevor Iha, FNP      PDMP not reviewed this encounter.   Michah, Minton, FNP 09/29/21 1028

## 2021-09-29 NOTE — Discharge Instructions (Addendum)
Advised patient of LS spine x-ray results with hard copy provided to patient.  Instructed patient to take medication as directed with food to completion.  Advised patient may take Robaxin daily or as needed for accompanying muscle spasms.  Encouraged patient to increase daily water intake while taking these medications.  Advised patient if symptoms worsen and/or unresolved please follow-up with PCP or here for further evaluation.

## 2021-10-04 ENCOUNTER — Encounter: Payer: Self-pay | Admitting: Family Medicine

## 2021-10-04 ENCOUNTER — Ambulatory Visit: Payer: BC Managed Care – PPO | Admitting: Family Medicine

## 2021-10-04 VITALS — BP 138/90 | Ht 69.0 in | Wt 185.0 lb

## 2021-10-04 DIAGNOSIS — G834 Cauda equina syndrome: Secondary | ICD-10-CM | POA: Diagnosis not present

## 2021-10-04 NOTE — Progress Notes (Signed)
  Sean Serrano - 51 y.o. male MRN [id number]  Date of birth: [birth year]  SUBJECTIVE:  Including CC & ROS.  No chief complaint on file.   Sean Serrano is a 51 y.o. male that is presenting with acute lower leg paresthesia and weakness.  The symptoms initially started on 6/22.  He had improvement with the courses of steroid but the symptoms have returned and worsened.  No injury or inciting event.  No new or different medications.  No history of back surgery and no recent infections.  Symptoms are occurring at the knees and distally bilaterally.  Review of the urgent care note from 7/1 shows he was provided Solu-Medrol with prednisone and Robaxin. Independent review of the lumbar spine x-ray from 7/1 shows a L1 wedge fracture with facet degenerative changes in the lower lumbar spine.  Review of Systems See HPI   HISTORY: Past Medical, Surgical, Social, and Family History Reviewed & Updated per EMR.   Pertinent Historical Findings include:  Past Medical History:  Diagnosis Date   ED (erectile dysfunction)    Hyperlipidemia     Past Surgical History:  Procedure Laterality Date   ac separation     SHOULDER SURGERY       PHYSICAL EXAM:  VS: BP (!) 160/100 (BP Location: Left Arm, Patient Position: Sitting)   Ht 5\' 9"  (1.753 m)   Wt 185 lb (83.9 kg)   BMI 27.32 kg/m  Physical Exam Gen: NAD, alert, cooperative with exam, well-appearing MSK:  Right and left leg: Weakness with resistance to dorsiflexion bilaterally. Hyperreflexia at the patellar tendon bilaterally and Achilles tendon. Weakness with resistance to knee extension    ASSESSMENT & PLAN:   Cauda equina syndrome (HCC) Acutely occurring.  Symptoms initially started on 6/22.  Currently not having any incontinence.  Having altered sensation and weakness in the lower legs and feet.  Hyperreflexia on exam.  X-ray was not demonstrating any acute changes. -Counseled on home exercise therapy and supportive care. -MRI of  the lumbar spine to evaluate for cauda equina syndrome and presurgical planning.

## 2021-10-04 NOTE — Assessment & Plan Note (Signed)
Acutely occurring.  Symptoms initially started on 6/22.  Currently not having any incontinence.  Having altered sensation and weakness in the lower legs and feet.  Hyperreflexia on exam.  X-ray was not demonstrating any acute changes. -Counseled on home exercise therapy and supportive care. -MRI of the lumbar spine to evaluate for cauda equina syndrome and presurgical planning.

## 2021-10-04 NOTE — Patient Instructions (Signed)
Nice to meet you Please keep me updated with any changes  We will get the MRI at [facility]. Please send me a message in MyChart with any questions or updates.  We will set up a virtual visit after the MRI is completed or you can follow-up with Dr. Karie Schwalbe..   --Dr. Jordan Likes

## 2021-10-05 ENCOUNTER — Encounter: Payer: Self-pay | Admitting: Family Medicine

## 2021-10-06 ENCOUNTER — Ambulatory Visit (INDEPENDENT_AMBULATORY_CARE_PROVIDER_SITE_OTHER): Payer: BC Managed Care – PPO

## 2021-10-06 DIAGNOSIS — G834 Cauda equina syndrome: Secondary | ICD-10-CM

## 2021-10-06 DIAGNOSIS — M5126 Other intervertebral disc displacement, lumbar region: Secondary | ICD-10-CM | POA: Diagnosis not present

## 2021-10-06 DIAGNOSIS — M545 Low back pain, unspecified: Secondary | ICD-10-CM

## 2021-10-06 DIAGNOSIS — M48061 Spinal stenosis, lumbar region without neurogenic claudication: Secondary | ICD-10-CM | POA: Diagnosis not present

## 2021-10-08 ENCOUNTER — Telehealth (INDEPENDENT_AMBULATORY_CARE_PROVIDER_SITE_OTHER): Payer: BC Managed Care – PPO | Admitting: Family Medicine

## 2021-10-08 ENCOUNTER — Encounter: Payer: Self-pay | Admitting: Family Medicine

## 2021-10-08 VITALS — Ht 69.0 in | Wt 185.0 lb

## 2021-10-08 DIAGNOSIS — G834 Cauda equina syndrome: Secondary | ICD-10-CM

## 2021-10-08 NOTE — Assessment & Plan Note (Signed)
MRI showing severe spinal stenosis with likely fragment of disc being the source. -Counseled on home exercise therapy and supportive care. -Referral to back surgeon.

## 2021-10-08 NOTE — Progress Notes (Signed)
Virtual Visit via Video Note  I connected with Sean Serrano on 10/08/21 at  8:45 AM EDT by a video enabled telemedicine application and verified that I am speaking with the correct person using two identifiers.  Location: Patient: office Provider: office   I discussed the limitations of evaluation and management by telemedicine and the availability of in person appointments. The patient expressed understanding and agreed to proceed.  History of Present Illness:  Sean Serrano is a 51 year old male that is following up after the MRI of his lumbar spine.  This is showing L4-5 severe spinal stenosis due to degeneration and herniation with what appears to be blood products or fragmented disc.   Observations/Objective:   Assessment and Plan:  Cauda equina syndrome: MRI showing severe spinal stenosis with likely fragment of disc being the source. -Counseled on home exercise therapy and supportive care. -Referral to back surgeon.  Follow Up Instructions:    I discussed the assessment and treatment plan with the patient. The patient was provided an opportunity to ask questions and all were answered. The patient agreed with the plan and demonstrated an understanding of the instructions.   The patient was advised to call back or seek an in-person evaluation if the symptoms worsen or if the condition fails to improve as anticipated.    Clare Gandy, MD

## 2021-10-09 ENCOUNTER — Ambulatory Visit: Payer: BC Managed Care – PPO | Admitting: Sports Medicine

## 2021-10-10 ENCOUNTER — Telehealth: Payer: Self-pay | Admitting: Family Medicine

## 2021-10-10 ENCOUNTER — Other Ambulatory Visit: Payer: Self-pay | Admitting: Family Medicine

## 2021-10-10 DIAGNOSIS — G834 Cauda equina syndrome: Secondary | ICD-10-CM

## 2021-10-10 MED ORDER — HYDROCODONE-ACETAMINOPHEN 5-325 MG PO TABS: 1.0000 | ORAL_TABLET | Freq: Three times a day (TID) | ORAL | 0 refills | Status: DC | PRN

## 2021-10-10 NOTE — Progress Notes (Signed)
Provided norco for pain.   Myra Rude, MD [facility] 10/10/2021, 8:05 AM

## 2021-10-12 NOTE — Addendum Note (Signed)
Addended by: Merrilyn Puma on: 10/12/2021 01:29 PM   Modules accepted: Orders

## 2021-10-12 NOTE — Telephone Encounter (Signed)
Patient scheduled 10/16/21 with Dr. Yevette Edwards.

## 2021-10-16 ENCOUNTER — Other Ambulatory Visit: Payer: Self-pay

## 2021-10-16 ENCOUNTER — Emergency Department (HOSPITAL_COMMUNITY)
Admission: EM | Admit: 2021-10-16 | Discharge: 2021-10-16 | Disposition: A | Discharge status: Home / Self Care | Payer: BC Managed Care – PPO | Attending: Emergency Medicine | Admitting: Emergency Medicine

## 2021-10-16 ENCOUNTER — Encounter (HOSPITAL_COMMUNITY): Payer: Self-pay | Admitting: Emergency Medicine

## 2021-10-16 DIAGNOSIS — Z7982 Long term (current) use of aspirin: Secondary | ICD-10-CM | POA: Insufficient documentation

## 2021-10-16 DIAGNOSIS — M48061 Spinal stenosis, lumbar region without neurogenic claudication: Secondary | ICD-10-CM | POA: Diagnosis not present

## 2021-10-16 DIAGNOSIS — Z20822 Contact with and (suspected) exposure to covid-19: Secondary | ICD-10-CM | POA: Diagnosis not present

## 2021-10-16 DIAGNOSIS — M545 Low back pain, unspecified: Secondary | ICD-10-CM | POA: Diagnosis not present

## 2021-10-16 LAB — CBC WITH DIFFERENTIAL/PLATELET
Abs Immature Granulocytes: 0.04 10*3/uL (ref 0.00–0.07)
Basophils Absolute: 0 10*3/uL (ref 0.0–0.1)
Basophils Relative: 0 %
Eosinophils Absolute: 0.2 10*3/uL (ref 0.0–0.5)
Eosinophils Relative: 3 %
HCT: 43.4 % (ref 39.0–52.0)
Hemoglobin: 15.1 g/dL (ref 13.0–17.0)
Immature Granulocytes: 1 %
Lymphocytes Relative: 29 %
Lymphs Abs: 2 10*3/uL (ref 0.7–4.0)
MCH: 32.7 pg (ref 26.0–34.0)
MCHC: 34.8 g/dL (ref 30.0–36.0)
MCV: 93.9 fL (ref 80.0–100.0)
Monocytes Absolute: 0.6 10*3/uL (ref 0.1–1.0)
Monocytes Relative: 8 %
Neutro Abs: 4.1 10*3/uL (ref 1.7–7.7)
Neutrophils Relative %: 59 %
Platelets: 189 10*3/uL (ref 150–400)
RBC: 4.62 MIL/uL (ref 4.22–5.81)
RDW: 12.5 % (ref 11.5–15.5)
WBC: 7 10*3/uL (ref 4.0–10.5)
nRBC: 0 % (ref 0.0–0.2)

## 2021-10-16 LAB — BASIC METABOLIC PANEL
Anion gap: 5 (ref 5–15)
BUN: 20 mg/dL (ref 6–20)
CO2: 24 mmol/L (ref 22–32)
Calcium: 8.8 mg/dL — ABNORMAL LOW (ref 8.9–10.3)
Chloride: 109 mmol/L (ref 98–111)
Creatinine, Ser: 1.03 mg/dL (ref 0.61–1.24)
GFR, Estimated: >60 mL/min (ref >60)
Glucose, Bld: 90 mg/dL (ref 70–99)
Potassium: 4.5 mmol/L (ref 3.5–5.1)
Sodium: 138 mmol/L (ref 135–145)

## 2021-10-16 LAB — RESP PANEL BY RT-PCR (FLU A&B, COVID) ARPGX2
Influenza A by PCR: NEGATIVE
Influenza B by PCR: NEGATIVE
SARS Coronavirus 2 by RT PCR: NEGATIVE

## 2021-10-16 MED ORDER — PREDNISONE 10 MG (21) PO TBPK: ORAL_TABLET | Freq: Every day | ORAL | 0 refills | Status: DC

## 2021-10-16 NOTE — Discharge Instructions (Signed)
I have discussed the case in detail with Dr. Conchita Paris with [facility] neurosurgery.  He advises discharge this evening with plan for close outpatient follow-up with someone in their clinic.  Please call the office tomorrow to confirm your appointment with Dr. Danielle Dess later this week.  If there is any issue with this appointment, please state that Dr. Conchita Paris said you must have follow-up with someone in their office later this week.  He recommended doing another steroid course.  If in the meantime you develop any increase in weakness or numbness in your legs or you have any bladder or bowel incontinence or urinary retention, you should return immediately to ER for reassessment.

## 2021-10-16 NOTE — ED Provider Triage Note (Signed)
Emergency Medicine Provider Triage Evaluation Note  Sean Serrano , a 51 y.o. male  was evaluated in triage.  Pt complains of BL sciatic sxs x1 month. Improved with steroids but now worsening.  Seen by Dr. Jordan Likes had an MRI that showed up cord compression between L4 and L5.  Had an appointment today with Dr. Yevette Edwards at [facility] with orthospine but was told he needed to come to the emergency department immediately.  He denies saddle anesthesia, bowel or bladder incontinence.  He has severe numbness in both of his feet, weakness with abduction of the hips and difficulty walking due to weakness in the legs.  Review of Systems  Positive: Sciatic pain Negative: Incontinence  Physical Exam  BP (!) 149/102 (BP Location: Left Arm)   Pulse 74   Temp 98.4 F (36.9 C) (Oral)   Resp 18   SpO2 98%  Gen:   Awake, no distress   Resp:  Normal effort  MSK:   Moves extremities without difficulty  Other:  Normal reflexes bilaterally, no clonus at the ankle, weakness with abduction of the hips, abnormal gait  Medical Decision Making  Medically screening exam initiated at 10:40 AM.  Appropriate orders placed.  Sean Serrano was informed that the remainder of the evaluation will be completed by another provider, this initial triage assessment does not replace that evaluation, and the importance of remaining in the ED until their evaluation is complete.   I was able to discuss the case with Dr. Yevette Edwards who states that he read the patient's chart and saw that he was diagnosed with cauda equina so sent him here without an initial work-up.    Arthor Captain, PA-C 10/16/21 1122

## 2021-10-16 NOTE — ED Triage Notes (Signed)
Pt states he believes he has a pinched nerve.  States symptoms started on 6/22 when he woke up.  Had an appt with [facility] today and states they called and cancelled appt and told him to go to ER.    Reports bilateral "sciatica" pain with numbness to bilateral feet and difficulty ambulating.  Denies back pain.  Denies incontinence.

## 2021-10-16 NOTE — ED Provider Notes (Addendum)
[facility] EMERGENCY DEPARTMENT Provider Note   CSN: [id number] Arrival date & time: 10/16/21  1016     History  Chief Complaint  Patient presents with   Leg Pain   foot numbness    Sean Serrano is a 51 y.o. male.  Presents to ER due to concern for back pain, abnormal MRI.  Patient started having some low back pain about 1 month ago.  July 1 he went to an urgent care and was given a shot of steroids as well as being started on a steroid taper.  He states that around that time he also had started noting numbness in the bottom of his feet.  Since then he has had some slowly worsening weakness in his legs; he is able to walk without assistance but is more difficult than normal.  He denies any sort of bladder or bowel incontinence or urinary retention. No saddle anesthesia. He states that he was supposed to see Dumonski this morning but received a phone call stating he needed to go to ER for assessment.  He feels like his back pain since being on the steroids is generally doing better.  Currently his pain at rest is minimal.  He states that he was given a prescription for hydrocodone but has not had to take many of these.  He denies any major medical problems.  Reviewed notes from urgent care, Dr. Jordan Likes, reviewed MRI lumbar spine IMPRESSION: 1. L4-5 severe spinal stenosis due to degeneration and herniation. Focal ventral epidural material at this level could be associated blood products or fragmented disc. Moderate left foraminal narrowing. 2. Remote L1 compression fracture. HPI     Home Medications Prior to Admission medications   Medication Sig Start Date End Date Taking? Authorizing Provider  aspirin (ASPIRIN LOW DOSE) 81 MG EC tablet Take 1 tablet (81 mg total) by mouth daily. Swallow whole. 12/11/20   Monica Becton, MD  atorvastatin (LIPITOR) 10 MG tablet Take 1 tablet (10 mg total) by mouth daily. 12/11/20   Monica Becton, MD   HYDROcodone-acetaminophen (NORCO/VICODIN) 5-325 MG tablet Take 1 tablet by mouth every 8 (eight) hours as needed. 10/10/21   Myra Rude, MD  naproxen (NAPROSYN) 500 MG tablet TAKE 1 TABLET BY MOUTH 2 TIMES DAILY WITH A MEAL. 08/13/21   Monica Becton, MD  predniSONE (STERAPRED UNI-PAK 21 TAB) 10 MG (21) TBPK tablet Take by mouth daily. Take 6 tabs by mouth daily  for 2 days, then 5 tabs for 2 days, then 4 tabs for 2 days, then 3 tabs for 2 days, 2 tabs for 2 days, then 1 tab by mouth daily for 2 days 10/16/21   Milagros Loll, MD  tadalafil (CIALIS) 5 MG tablet Take 1 tablet (5 mg total) by mouth daily. 12/11/20   Monica Becton, MD      Allergies    Patient has no known allergies.    Review of Systems   Review of Systems  Constitutional:  Negative for chills and fever.  HENT:  Negative for ear pain and sore throat.   Eyes:  Negative for pain and visual disturbance.  Respiratory:  Negative for cough and shortness of breath.   Cardiovascular:  Negative for chest pain and palpitations.  Gastrointestinal:  Negative for abdominal pain and vomiting.  Genitourinary:  Negative for dysuria and hematuria.  Musculoskeletal:  Positive for back pain. Negative for arthralgias.  Skin:  Negative for color change and rash.  Neurological:  Negative for seizures and syncope.  All other systems reviewed and are negative.   Physical Exam Updated Vital Signs BP (!) 135/91   Pulse 68   Temp 98 F (36.7 C) (Oral)   Resp 15   SpO2 98%  Physical Exam Vitals and nursing note reviewed.  Constitutional:      General: He is not in acute distress.    Appearance: He is well-developed.  HENT:     Head: Normocephalic and atraumatic.  Eyes:     Conjunctiva/sclera: Conjunctivae normal.  Cardiovascular:     Rate and Rhythm: Normal rate and regular rhythm.     Heart sounds: No murmur heard. Pulmonary:     Effort: Pulmonary effort is normal. No respiratory distress.  Abdominal:      Palpations: Abdomen is soft.     Tenderness: There is no abdominal tenderness.  Musculoskeletal:        General: No swelling.     Cervical back: Neck supple.  Skin:    General: Skin is warm and dry.     Capillary Refill: Capillary refill takes less than 2 seconds.  Neurological:     Mental Status: He is alert.     Comments: Right lower extremity: There is mild weakness on leg abduction, normal flexion and extension, there is weakness of EHL, normal dorsiflexion and plantarflexion; sensation to light touch intact throughout extremity Left lower extremity: There is mild weakness on leg abduction, normal flexion and extension, there is weakness of EHL, normal dorsiflexion and plantarflexion; sensation to light touch intact throughout extremity  Psychiatric:        Mood and Affect: Mood normal.     ED Results / Procedures / Treatments   Labs (all labs ordered are listed, but only abnormal results are displayed) Labs Reviewed  BASIC METABOLIC PANEL - Abnormal; Notable for the following components:      Result Value   Calcium 8.8 (*)    All other components within normal limits  RESP PANEL BY RT-PCR (FLU A&B, COVID) ARPGX2  CBC WITH DIFFERENTIAL/PLATELET    EKG None  Radiology No results found.  Procedures Procedures    Medications Ordered in ED Medications - No data to display  ED Course/ Medical Decision Making/ A&P                           Medical Decision Making Risk Prescription drug management.   51 year old gentleman presenting to ER due to concern for back pain, abnormal MRI.  MRI on 7/8 most notable for severe spinal stenosis at level L4-L5 from degeneration and disc herniation.  Notably the conus and cauda equina appear normal.  Patient does have some mild weakness on bilateral leg abduction and EHL weakness.  No sensory deficits.  I discussed the case in detail with Dr. Kathyrn Sheriff.  He states that he was notified by Dr. Lynann Bologna earlier in the day about the  case.  He states he has already reviewed the MRI images, states this is not consistent with cauda equina syndrome and the patient does not need any emergency surgery.  Furthermore Dr. Kathyrn Sheriff states patient can be discharged and managed safely in the outpatient setting at this time.  He does however feel patient needs urgent outpatient follow-up.  Patient reports that he already has appointment with someone at [place] [facility], likely Dr. Ellene Route on Friday.  Dr. Kathyrn Sheriff said this is acceptable and specifically recommended discharging patient tonight.  He further advised starting  another course of steroids.  I had lengthy discussion with patient about the recommendations from Dr. Conchita Paris and advised him to call their office tomorrow to confirm that he has an appointment on Friday.  I had lengthy discussion with patient and wife regarding return precautions should he have any worsening of the numbness or weakness or any development of urinary retention, bladder or bowel incontinence or other new concerning symptoms.  He demonstrated very good understanding.  His pain is well controlled at this time.  Based on the recommendations from neurosurgery, will discharge patient.        Final Clinical Impression(s) / ED Diagnoses Final diagnoses:  Spinal stenosis at L4-L5 level    Rx / DC Orders ED Discharge Orders          Ordered    predniSONE (STERAPRED UNI-PAK 21 TAB) 10 MG (21) TBPK tablet  Daily        10/16/21 2101              Milagros Loll, MD 10/16/21 2132    Milagros Loll, MD 10/16/21 2133

## 2021-10-19 ENCOUNTER — Ambulatory Visit (HOSPITAL_COMMUNITY): Admission: RE | Disposition: A | Discharge status: Home / Self Care | Payer: Self-pay | Attending: Neurological Surgery

## 2021-10-19 ENCOUNTER — Ambulatory Visit (HOSPITAL_COMMUNITY): Payer: BC Managed Care – PPO

## 2021-10-19 ENCOUNTER — Other Ambulatory Visit: Payer: Self-pay

## 2021-10-19 ENCOUNTER — Encounter (HOSPITAL_COMMUNITY): Payer: Self-pay | Admitting: Neurological Surgery

## 2021-10-19 ENCOUNTER — Other Ambulatory Visit: Payer: Self-pay | Admitting: Neurological Surgery

## 2021-10-19 ENCOUNTER — Observation Stay (HOSPITAL_COMMUNITY)
Admission: RE | Admit: 2021-10-19 | Discharge: 2021-10-20 | Disposition: A | Discharge status: Home / Self Care | Payer: BC Managed Care – PPO | Source: Other Acute Inpatient Hospital | Attending: Neurological Surgery | Admitting: Neurological Surgery

## 2021-10-19 ENCOUNTER — Ambulatory Visit (HOSPITAL_COMMUNITY): Payer: BC Managed Care – PPO | Admitting: Certified Registered Nurse Anesthetist

## 2021-10-19 DIAGNOSIS — M48061 Spinal stenosis, lumbar region without neurogenic claudication: Secondary | ICD-10-CM | POA: Diagnosis not present

## 2021-10-19 DIAGNOSIS — Z79899 Other long term (current) drug therapy: Secondary | ICD-10-CM | POA: Insufficient documentation

## 2021-10-19 DIAGNOSIS — G834 Cauda equina syndrome: Secondary | ICD-10-CM | POA: Insufficient documentation

## 2021-10-19 DIAGNOSIS — Z6828 Body mass index (BMI) 28.0-28.9, adult: Secondary | ICD-10-CM | POA: Diagnosis not present

## 2021-10-19 DIAGNOSIS — M5126 Other intervertebral disc displacement, lumbar region: Principal | ICD-10-CM | POA: Insufficient documentation

## 2021-10-19 DIAGNOSIS — Z0389 Encounter for observation for other suspected diseases and conditions ruled out: Secondary | ICD-10-CM | POA: Diagnosis not present

## 2021-10-19 HISTORY — PX: LUMBAR LAMINECTOMY/DECOMPRESSION MICRODISCECTOMY: SHX5026

## 2021-10-19 HISTORY — DX: Other specified postprocedural states: Z98.890

## 2021-10-19 HISTORY — DX: Nausea with vomiting, unspecified: R11.2

## 2021-10-19 LAB — SURGICAL PCR SCREEN

## 2021-10-19 SURGERY — LUMBAR LAMINECTOMY/DECOMPRESSION MICRODISCECTOMY 1 LEVEL
Anesthesia: General | Site: Back | Laterality: Bilateral

## 2021-10-19 MED ORDER — CEFAZOLIN SODIUM-DEXTROSE 2-4 GM/100ML-% IV SOLN
2.0000 g | Freq: Three times a day (TID) | INTRAVENOUS | Status: AC
  Administered 2021-10-19 – 2021-10-20 (×2): 2 g via INTRAVENOUS
  Filled 2021-10-19 (×2): qty 100

## 2021-10-19 MED ORDER — FENTANYL CITRATE (PF) 100 MCG/2ML IJ SOLN: 25.0000 ug | INTRAMUSCULAR | Status: DC | PRN

## 2021-10-19 MED ORDER — ACETAMINOPHEN 10 MG/ML IV SOLN
INTRAVENOUS | Status: DC | PRN
  Administered 2021-10-19: 1000 mg via INTRAVENOUS

## 2021-10-19 MED ORDER — SUGAMMADEX SODIUM 200 MG/2ML IV SOLN
INTRAVENOUS | Status: DC | PRN
  Administered 2021-10-19: 400 mg via INTRAVENOUS

## 2021-10-19 MED ORDER — SODIUM CHLORIDE 0.9% FLUSH
3.0000 mL | Freq: Two times a day (BID) | INTRAVENOUS | Status: DC
  Administered 2021-10-19: 3 mL via INTRAVENOUS

## 2021-10-19 MED ORDER — HYDROMORPHONE HCL 1 MG/ML IJ SOLN
INTRAMUSCULAR | Status: DC | PRN
  Administered 2021-10-19: .5 mg via INTRAVENOUS

## 2021-10-19 MED ORDER — 0.9 % SODIUM CHLORIDE (POUR BTL) OPTIME
TOPICAL | Status: DC | PRN
  Administered 2021-10-19: 1000 mL

## 2021-10-19 MED ORDER — DEXAMETHASONE SODIUM PHOSPHATE 10 MG/ML IJ SOLN
INTRAMUSCULAR | Status: DC | PRN
  Administered 2021-10-19: 10 mg via INTRAVENOUS

## 2021-10-19 MED ORDER — MENTHOL 3 MG MT LOZG: 1.0000 | LOZENGE | OROMUCOSAL | Status: DC | PRN

## 2021-10-19 MED ORDER — FAMOTIDINE 20 MG PO TABS
20.0000 mg | ORAL_TABLET | Freq: Every day | ORAL | Status: DC
  Administered 2021-10-19: 20 mg via ORAL
  Filled 2021-10-19: qty 1

## 2021-10-19 MED ORDER — DOCUSATE SODIUM 100 MG PO CAPS: 100.0000 mg | ORAL_CAPSULE | Freq: Two times a day (BID) | ORAL | Status: DC

## 2021-10-19 MED ORDER — ACETAMINOPHEN 325 MG PO TABS: 650.0000 mg | ORAL_TABLET | ORAL | Status: DC | PRN

## 2021-10-19 MED ORDER — SENNA 8.6 MG PO TABS: 1.0000 | ORAL_TABLET | Freq: Two times a day (BID) | ORAL | Status: DC

## 2021-10-19 MED ORDER — ACETAMINOPHEN 10 MG/ML IV SOLN
INTRAVENOUS | Status: AC
Start: 2021-10-19 — End: ?
  Filled 2021-10-19: qty 100

## 2021-10-19 MED ORDER — PROPOFOL 10 MG/ML IV BOLUS
INTRAVENOUS | Status: DC | PRN
  Administered 2021-10-19: 200 mg via INTRAVENOUS
  Administered 2021-10-19: 50 mg via INTRAVENOUS

## 2021-10-19 MED ORDER — CEFAZOLIN SODIUM-DEXTROSE 2-3 GM-%(50ML) IV SOLR
INTRAVENOUS | Status: DC | PRN
  Administered 2021-10-19: 2 g via INTRAVENOUS

## 2021-10-19 MED ORDER — ACETAMINOPHEN 650 MG RE SUPP: 650.0000 mg | RECTAL | Status: DC | PRN

## 2021-10-19 MED ORDER — BUPIVACAINE HCL (PF) 0.5 % IJ SOLN
INTRAMUSCULAR | Status: AC
  Filled 2021-10-19: qty 30

## 2021-10-19 MED ORDER — LACTATED RINGERS IV SOLN
INTRAVENOUS | Status: DC
Start: 2021-10-19 — End: 2021-10-19

## 2021-10-19 MED ORDER — CHLORHEXIDINE GLUCONATE 0.12 % MT SOLN
15.0000 mL | Freq: Once | OROMUCOSAL | Status: AC
  Administered 2021-10-19: 15 mL via OROMUCOSAL
  Filled 2021-10-19: qty 15

## 2021-10-19 MED ORDER — ONDANSETRON HCL 4 MG/2ML IJ SOLN: 4.0000 mg | Freq: Four times a day (QID) | INTRAMUSCULAR | Status: DC | PRN

## 2021-10-19 MED ORDER — BISACODYL 10 MG RE SUPP: 10.0000 mg | Freq: Every day | RECTAL | Status: DC | PRN

## 2021-10-19 MED ORDER — PHENOL 1.4 % MT LIQD: 1.0000 | OROMUCOSAL | Status: DC | PRN

## 2021-10-19 MED ORDER — MIDAZOLAM HCL 2 MG/2ML IJ SOLN
INTRAMUSCULAR | Status: AC
  Filled 2021-10-19: qty 2

## 2021-10-19 MED ORDER — MIDAZOLAM HCL 5 MG/5ML IJ SOLN
INTRAMUSCULAR | Status: DC | PRN
  Administered 2021-10-19: 2 mg via INTRAVENOUS

## 2021-10-19 MED ORDER — THROMBIN 5000 UNITS EX SOLR
CUTANEOUS | Status: AC
  Filled 2021-10-19: qty 15000

## 2021-10-19 MED ORDER — FLEET ENEMA 7-19 GM/118ML RE ENEM: 1.0000 | ENEMA | Freq: Once | RECTAL | Status: DC | PRN

## 2021-10-19 MED ORDER — METHOCARBAMOL 1000 MG/10ML IJ SOLN: 500.0000 mg | Freq: Four times a day (QID) | INTRAVENOUS | Status: DC | PRN

## 2021-10-19 MED ORDER — FENTANYL CITRATE (PF) 250 MCG/5ML IJ SOLN
INTRAMUSCULAR | Status: DC | PRN
  Administered 2021-10-19: 100 ug via INTRAVENOUS

## 2021-10-19 MED ORDER — ATORVASTATIN CALCIUM 10 MG PO TABS: 10.0000 mg | ORAL_TABLET | Freq: Every day | ORAL | Status: DC

## 2021-10-19 MED ORDER — BUPIVACAINE HCL (PF) 0.5 % IJ SOLN
INTRAMUSCULAR | Status: DC | PRN
  Administered 2021-10-19: 25 mL
  Administered 2021-10-19: 5 mL

## 2021-10-19 MED ORDER — METHOCARBAMOL 500 MG PO TABS
500.0000 mg | ORAL_TABLET | Freq: Four times a day (QID) | ORAL | Status: DC | PRN
  Administered 2021-10-19 – 2021-10-20 (×2): 500 mg via ORAL
  Filled 2021-10-19 (×2): qty 1

## 2021-10-19 MED ORDER — ORAL CARE MOUTH RINSE: 15.0000 mL | Freq: Once | OROMUCOSAL | Status: AC

## 2021-10-19 MED ORDER — SODIUM CHLORIDE 0.9 % IV SOLN
250.0000 mL | INTRAVENOUS | Status: DC
  Administered 2021-10-19: 250 mL via INTRAVENOUS

## 2021-10-19 MED ORDER — LIDOCAINE-EPINEPHRINE 1 %-1:100000 IJ SOLN
INTRAMUSCULAR | Status: AC
Start: 2021-10-19 — End: ?
  Filled 2021-10-19: qty 1

## 2021-10-19 MED ORDER — LIDOCAINE 2% (20 MG/ML) 5 ML SYRINGE
INTRAMUSCULAR | Status: DC | PRN
  Administered 2021-10-19: 60 mg via INTRAVENOUS

## 2021-10-19 MED ORDER — SCOPOLAMINE 1 MG/3DAYS TD PT72
MEDICATED_PATCH | TRANSDERMAL | Status: AC
Start: 2021-10-19 — End: ?
  Filled 2021-10-19: qty 1

## 2021-10-19 MED ORDER — THROMBIN 5000 UNITS EX SOLR
OROMUCOSAL | Status: DC | PRN
  Administered 2021-10-19: 5 mL via TOPICAL

## 2021-10-19 MED ORDER — HYDROCODONE-ACETAMINOPHEN 5-325 MG PO TABS
1.0000 | ORAL_TABLET | ORAL | Status: DC | PRN
  Administered 2021-10-19 – 2021-10-20 (×3): 1 via ORAL
  Filled 2021-10-19 (×3): qty 1

## 2021-10-19 MED ORDER — SODIUM CHLORIDE 0.9% FLUSH: 3.0000 mL | INTRAVENOUS | Status: DC | PRN

## 2021-10-19 MED ORDER — LIDOCAINE-EPINEPHRINE 1 %-1:100000 IJ SOLN
INTRAMUSCULAR | Status: DC | PRN
  Administered 2021-10-19: 5 mL

## 2021-10-19 MED ORDER — THROMBIN (RECOMBINANT) 5000 UNITS EX SOLR
CUTANEOUS | Status: DC | PRN
  Administered 2021-10-19: 10 mL via TOPICAL

## 2021-10-19 MED ORDER — POLYETHYLENE GLYCOL 3350 17 G PO PACK: 17.0000 g | PACK | Freq: Every day | ORAL | Status: DC | PRN

## 2021-10-19 MED ORDER — MORPHINE SULFATE (PF) 2 MG/ML IV SOLN: 2.0000 mg | INTRAVENOUS | Status: DC | PRN

## 2021-10-19 MED ORDER — HYDROMORPHONE HCL 1 MG/ML IJ SOLN
INTRAMUSCULAR | Status: AC
  Filled 2021-10-19: qty 0.5

## 2021-10-19 MED ORDER — ONDANSETRON HCL 4 MG PO TABS: 4.0000 mg | ORAL_TABLET | Freq: Four times a day (QID) | ORAL | Status: DC | PRN

## 2021-10-19 MED ORDER — FENTANYL CITRATE (PF) 250 MCG/5ML IJ SOLN
INTRAMUSCULAR | Status: AC
  Filled 2021-10-19: qty 5

## 2021-10-19 MED ORDER — ROCURONIUM BROMIDE 10 MG/ML (PF) SYRINGE
PREFILLED_SYRINGE | INTRAVENOUS | Status: DC | PRN
  Administered 2021-10-19: 40 mg via INTRAVENOUS
  Administered 2021-10-19: 20 mg via INTRAVENOUS
  Administered 2021-10-19: 60 mg via INTRAVENOUS
  Administered 2021-10-19: 20 mg via INTRAVENOUS

## 2021-10-19 SURGICAL SUPPLY — 55 items
ADH SKN CLS APL DERMABOND .7 (GAUZE/BANDAGES/DRESSINGS) ×1
BAG COUNTER SPONGE SURGICOUNT (BAG) ×3 IMPLANT
BAG SPNG CNTER NS LX DISP (BAG) ×1
BAND INSRT 18 STRL LF DISP RB (MISCELLANEOUS)
BAND RUBBER #18 3X1/16 STRL (MISCELLANEOUS) IMPLANT
BLADE CLIPPER SURG (BLADE) IMPLANT
BUR ACORN 6.0 (BURR) IMPLANT
BUR MATCHSTICK NEURO 3.0 LAGG (BURR) ×3 IMPLANT
CANISTER SUCT 3000ML PPV (MISCELLANEOUS) ×3 IMPLANT
DERMABOND ADVANCED (GAUZE/BANDAGES/DRESSINGS) ×1
DERMABOND ADVANCED .7 DNX12 (GAUZE/BANDAGES/DRESSINGS) ×2 IMPLANT
DEVICE DISSECT PLASMABLAD 3.0S (MISCELLANEOUS) ×2 IMPLANT
DRAPE HALF SHEET 40X57 (DRAPES) IMPLANT
DRAPE LAPAROTOMY T 102X78X121 (DRAPES) ×3 IMPLANT
DRAPE MICROSCOPE LEICA (MISCELLANEOUS) IMPLANT
DURAPREP 26ML APPLICATOR (WOUND CARE) ×3 IMPLANT
ELECT REM PT RETURN 9FT ADLT (ELECTROSURGICAL) ×2
ELECTRODE REM PT RTRN 9FT ADLT (ELECTROSURGICAL) ×2 IMPLANT
GAUZE 4X4 16PLY ~~LOC~~+RFID DBL (SPONGE) IMPLANT
GAUZE SPONGE 4X4 12PLY STRL (GAUZE/BANDAGES/DRESSINGS) ×3 IMPLANT
GLOVE BIOGEL M 6.5 STRL (GLOVE) ×1 IMPLANT
GLOVE BIOGEL PI IND STRL 6 (GLOVE) IMPLANT
GLOVE BIOGEL PI IND STRL 7.5 (GLOVE) IMPLANT
GLOVE BIOGEL PI IND STRL 8.5 (GLOVE) ×2 IMPLANT
GLOVE BIOGEL PI INDICATOR 6 (GLOVE) ×1
GLOVE BIOGEL PI INDICATOR 7.5 (GLOVE) ×2
GLOVE BIOGEL PI INDICATOR 8.5 (GLOVE) ×1
GLOVE ECLIPSE 7.0 STRL STRAW (GLOVE) ×1 IMPLANT
GLOVE ECLIPSE 7.5 STRL STRAW (GLOVE) ×1 IMPLANT
GLOVE ECLIPSE 8.5 STRL (GLOVE) ×3 IMPLANT
GOWN STRL REUS W/ TWL LRG LVL3 (GOWN DISPOSABLE) IMPLANT
GOWN STRL REUS W/ TWL XL LVL3 (GOWN DISPOSABLE) IMPLANT
GOWN STRL REUS W/TWL 2XL LVL3 (GOWN DISPOSABLE) ×3 IMPLANT
GOWN STRL REUS W/TWL LRG LVL3 (GOWN DISPOSABLE)
GOWN STRL REUS W/TWL XL LVL3 (GOWN DISPOSABLE)
KIT BASIN OR (CUSTOM PROCEDURE TRAY) ×3 IMPLANT
KIT TURNOVER KIT B (KITS) ×3 IMPLANT
NDL SPNL 20GX3.5 QUINCKE YW (NEEDLE) IMPLANT
NEEDLE HYPO 22GX1.5 SAFETY (NEEDLE) ×3 IMPLANT
NEEDLE SPNL 20GX3.5 QUINCKE YW (NEEDLE) IMPLANT
NS IRRIG 1000ML POUR BTL (IV SOLUTION) ×3 IMPLANT
PACK LAMINECTOMY NEURO (CUSTOM PROCEDURE TRAY) ×3 IMPLANT
PAD ARMBOARD 7.5X6 YLW CONV (MISCELLANEOUS) ×9 IMPLANT
PATTIES SURGICAL .5 X1 (DISPOSABLE) ×3 IMPLANT
PLASMABLADE 3.0S (MISCELLANEOUS) ×2
SPIKE FLUID TRANSFER (MISCELLANEOUS) ×2 IMPLANT
SPONGE SURGIFOAM ABS GEL SZ50 (HEMOSTASIS) ×3 IMPLANT
SUT VIC AB 1 CT1 18XBRD ANBCTR (SUTURE) ×2 IMPLANT
SUT VIC AB 1 CT1 8-18 (SUTURE) ×2
SUT VIC AB 2-0 CP2 18 (SUTURE) ×3 IMPLANT
SUT VIC AB 3-0 SH 8-18 (SUTURE) ×3 IMPLANT
SUT VIC AB 4-0 RB1 18 (SUTURE) ×3 IMPLANT
TOWEL GREEN STERILE (TOWEL DISPOSABLE) ×3 IMPLANT
TOWEL GREEN STERILE FF (TOWEL DISPOSABLE) ×3 IMPLANT
WATER STERILE IRR 1000ML POUR (IV SOLUTION) ×3 IMPLANT

## 2021-10-19 NOTE — Anesthesia Procedure Notes (Signed)
Procedure Name: Intubation Date/Time: 10/19/2021 3:29 PM  Performed by: Georgia Duff, CRNAPre-anesthesia Checklist: Patient identified, Emergency Drugs available, Suction available and Patient being monitored Patient Re-evaluated:Patient Re-evaluated prior to induction Oxygen Delivery Method: Circle System Utilized Preoxygenation: Pre-oxygenation with 100% oxygen Induction Type: IV induction Ventilation: Mask ventilation without difficulty Laryngoscope Size: Mac and 3 Grade View: Grade I Tube type: Oral Tube size: 7.5 mm Number of attempts: 1 Airway Equipment and Method: Stylet and Oral airway Placement Confirmation: ETT inserted through vocal cords under direct vision, positive ETCO2 and breath sounds checked- equal and bilateral Secured at: 23 cm Tube secured with: Tape Dental Injury: Teeth and Oropharynx as per pre-operative assessment

## 2021-10-19 NOTE — Op Note (Signed)
Date of surgery: 10/19/2021 Preoperative diagnosis: Herniated nucleus pulposus L4-L5 with cauda equina syndrome Postoperative diagnosis: Same Procedure: Bilateral laminotomies and discectomy L4-L5 with decompression of the L5 nerve roots and common dural tube.  Operating microscope Surgeon: Barnett Abu Anesthesia: General endotracheal Indications: Everson Mott is a 51 year old individual who developed sudden and severe pain and weakness in his legs on 22 June he has been dealing with this since that time and an MRI recently demonstrated presence of a large central disc herniation he is developed weakness in his legs in the tibialis anterior and the glutei bilaterally he has been advised regarding surgical decompression of the L4-L5 space.  Procedure: Patient was brought to the operating room supine on the stretcher.  After the smooth induction of general tracheal anesthesia, he was carefully turned prone.  Back was prepped with alcohol DuraPrep and draped in a sterile fashion.  Then the L4-5 space was identified with spinal needle enter x-ray identification and a vertical incision was created in the midline overlying the L4-5 space.  The dissection was carried down to the lumbodorsal fascia which was opened on either side of midline.  A subperiosteal dissection was then carried out at L4-L5 on either side and dissection was carried out to the lateral aspect of the facet procedure was started first on the left side where the interlaminar space was opened and a laminectomy removing the inferior margin lamina of L4 out to the mesial wall the facet was performed.  Partial medial facetectomy was performed the L ligament was taken up.  The underlying dura was identified.  Dissection was carried out laterally to expose the path the L5 nerve root the L5 nerve root could be mobilized gently medially but it was noted to be compressed under significant mass this mass was in the epidural space between the ligament and  the dura itself and was noted to be comprised of several fragments of disc these were gently dissected and removed further exploration noted that the ligament itself was somewhat boggy however no fragments of disc could be persuaded through this ligament and there were no openings in the ligament on the side once this discectomy was performed and the common dural tube and nerve was decompressed well attention was turned to the opposite side where similar laminotomy was carried out here there were several fragments of disc in the free space and under the ligament medially there were identified some disc fragments which could be removed this allowed for good decompression of the common dural tube and then the path of the L5 nerve root on this right side was decompressed similarly most of the disc material was flattened in the epidural space in this area this was carefully removed and the dura was free and clear of any impingement from disc material the subligamentous space was then explored the disc space itself was explored with the posterior inferior edge of L4 immediately proximate to the superior edge of L5.  With this no disc material could be extruded from the disc space is not felt that it would be appropriate to invade the disc space at this time with the epidural space being well decompressed care was taken to make sure that the nerve root on the right side remained decompressed as well as the nerve root on the left side was decompressed when no further disc could be accessed from the epidural space then the area was infiltrated with half percent Marcaine solution for a total of 25 cc.  This was  also placed in the fascia the fascia was then closed with #1 Vicryl in interrupted fashion 2-0 Vicryl was used in subcutaneous tissues 3-0 Vicryl subcuticularly along with 4-0 Vicryl and the final subcuticular layer Dermabond was placed on the skin blood loss was estimated at 100 cc.

## 2021-10-19 NOTE — H&P (Signed)
Sean Serrano is an 51 y.o. male.   Chief Complaint: Leg pain bilateral leg weakness HPI: Sean Serrano is a 51 year old right-handed individual who tells me that around 22 June he woke up with significant back pain radiating into both lower extremities.  He felt that it may have been a strain back and dealt with this for about a week but then rather suddenly noticed that the back pain quit and legs became substantially weak this seemed to coincide with having had an injection of some steroid medicine and oral prednisone Dosepak that was prescribed by his primary care physician since that time he notes that his legs have been substantially weak and still are quite painful pain down the buttocks posterior thighs into the anterolateral shins notes he can feel his legs but they do feel different and he cannot walk well as he has substantial weakness with propelling his legs forward.  The patient was ultimately seen in the emergency department on 8 July and MRI demonstrates presence of a large central disc herniation at L4-L5 with severe stenosis.  He has been advised to seek further help and is seen in the office this morning  Patient notes that he is having a hard time walking and has significant dysfunction.  I have advised surgical decompression L4-L5 with bilateral laminotomies and foraminotomies.  Past Medical History:  Diagnosis Date   ED (erectile dysfunction)    Hyperlipidemia    PONV (postoperative nausea and vomiting)     Past Surgical History:  Procedure Laterality Date   ac separation     SHOULDER SURGERY      Family History  Problem Relation Age of Onset   Healthy Mother    Social History:  reports that he has never smoked. He has never used smokeless tobacco. He reports current alcohol use of about 12.0 standard drinks of alcohol per week. He reports that he does not use drugs.  Allergies: No Known Allergies  Medications Prior to Admission  Medication Sig Dispense Refill    atorvastatin (LIPITOR) 10 MG tablet Take 1 tablet (10 mg total) by mouth daily. 90 tablet 3   calcium carbonate (TUMS EX) 750 MG chewable tablet Chew 750 mg by mouth daily as needed for heartburn.     famotidine (PEPCID) 20 MG tablet Take 20 mg by mouth daily.     HYDROcodone-acetaminophen (NORCO/VICODIN) 5-325 MG tablet Take 1 tablet by mouth every 8 (eight) hours as needed. (Patient taking differently: Take 1 tablet by mouth every 8 (eight) hours as needed for moderate pain.) 15 tablet 0   naproxen (NAPROSYN) 500 MG tablet TAKE 1 TABLET BY MOUTH 2 TIMES DAILY WITH A MEAL. (Patient taking differently: Take 500 mg by mouth 2 (two) times daily with a meal.) 60 tablet 3   predniSONE (STERAPRED UNI-PAK 21 TAB) 10 MG (21) TBPK tablet Take by mouth daily. Take 6 tabs by mouth daily  for 2 days, then 5 tabs for 2 days, then 4 tabs for 2 days, then 3 tabs for 2 days, 2 tabs for 2 days, then 1 tab by mouth daily for 2 days 42 tablet 0   tadalafil (CIALIS) 5 MG tablet Take 1 tablet (5 mg total) by mouth daily. 30 tablet 11    No results found for this or any previous visit (from the past 48 hour(s)). No results found.  Review of Systems  Constitutional:  Positive for activity change.  Musculoskeletal:  Positive for back pain, gait problem and myalgias.  Neurological:  Positive for weakness and numbness.  All other systems reviewed and are negative.   Blood pressure (!) 146/89, pulse 74, temperature 98.3 F (36.8 C), temperature source Oral, resp. rate 18, height 5\' 9"  (1.753 m), weight 86.2 kg, SpO2 99 %. Physical Exam Constitutional:      Appearance: Normal appearance. He is normal weight.  HENT:     Head: Normocephalic and atraumatic.     Right Ear: Tympanic membrane normal.     Left Ear: Tympanic membrane normal.     Nose: Nose normal.     Mouth/Throat:     Mouth: Mucous membranes are moist.     Pharynx: Oropharynx is clear.  Eyes:     Extraocular Movements: Extraocular movements intact.      Conjunctiva/sclera: Conjunctivae normal.     Pupils: Pupils are equal, round, and reactive to light.  Cardiovascular:     Rate and Rhythm: Normal rate and regular rhythm.     Pulses: Normal pulses.     Heart sounds: Normal heart sounds.  Pulmonary:     Effort: Pulmonary effort is normal.  Abdominal:     General: Abdomen is flat. Bowel sounds are normal.     Palpations: Abdomen is soft.  Musculoskeletal:     Cervical back: Normal range of motion and neck supple.     Comments: Positive straight leg raising 30 degrees in either lower extremity Patrick's maneuver is negative bilaterally  Skin:    General: Skin is warm and dry.     Capillary Refill: Capillary refill takes less than 2 seconds.  Neurological:     Mental Status: He is alert and oriented to person, place, and time.     Comments: Marked gluteal weakness bilaterally at 3 out of 5 marked tibialis anterior weakness at 3- out of 5 bilaterally extensor houses longus is 2 out of 5.  Gastroc strength is 4 out of 5.  Absent reflexes in the lower extremities 1+ reflex in the biceps and triceps bilaterally.  Cranial nerve examination is normal  Psychiatric:        Mood and Affect: Mood normal.        Behavior: Behavior normal.        Thought Content: Thought content normal.        Judgment: Judgment normal.      Assessment/Plan Herniated nucleus pulposus L4-L5 with cauda equina syndrome.  Plan: Bilateral laminotomies and foraminotomies decompression of L4-L5.  , MD 10/19/2021, 2:54 PM

## 2021-10-19 NOTE — Anesthesia Preprocedure Evaluation (Signed)
Anesthesia Evaluation  Patient identified by MRN, date of birth, ID band Patient awake    Reviewed: Allergy & Precautions, NPO status   History of Anesthesia Complications (+) PONV and history of anesthetic complications  Airway Mallampati: II  TM Distance: >3 FB     Dental   Pulmonary    breath sounds clear to auscultation       Cardiovascular negative cardio ROS   Rhythm:Regular Rate:Normal     Neuro/Psych  Neuromuscular disease    GI/Hepatic negative GI ROS, Neg liver ROS,   Endo/Other  negative endocrine ROS  Renal/GU negative Renal ROS     Musculoskeletal   Abdominal   Peds  Hematology   Anesthesia Other Findings   Reproductive/Obstetrics                             Anesthesia Physical Anesthesia Plan  ASA: 2  Anesthesia Plan: General   Post-op Pain Management:    Induction: Intravenous  PONV Risk Score and Plan: Ondansetron, Dexamethasone and Midazolam  Airway Management Planned: Oral ETT  Additional Equipment:   Intra-op Plan:   Post-operative Plan: Extubation in OR  Informed Consent: I have reviewed the patients History and Physical, chart, labs and discussed the procedure including the risks, benefits and alternatives for the proposed anesthesia with the patient or authorized representative who has indicated his/her understanding and acceptance.     Dental advisory given  Plan Discussed with: CRNA and Anesthesiologist  Anesthesia Plan Comments:         Anesthesia Quick Evaluation

## 2021-10-19 NOTE — Transfer of Care (Signed)
Immediate Anesthesia Transfer of Care Note  Patient: Sean Serrano  Procedure(s) Performed: Bilateral Lumbar four-five  Microdiscectomy (Bilateral: Back)  Patient Location: PACU  Anesthesia Type:General  Level of Consciousness: drowsy and patient cooperative  Airway & Oxygen Therapy: Patient Spontanous Breathing  Post-op Assessment: Report given to RN and Post -op Vital signs reviewed and stable  Post vital signs: Reviewed and stable  Last Vitals:  Vitals Value Taken Time  BP 130/79 10/19/21 1745  Temp 36.5 C 10/19/21 1745  Pulse 86 10/19/21 1748  Resp 14 10/19/21 1748  SpO2 94 % 10/19/21 1748  Vitals shown include unvalidated device data.  Last Pain:  Vitals:   10/19/21 1235  TempSrc:   PainSc: 0-No pain      Patients Stated Pain Goal: 3 (10/19/21 1235)  Complications: No notable events documented.

## 2021-10-20 DIAGNOSIS — Z79899 Other long term (current) drug therapy: Secondary | ICD-10-CM | POA: Diagnosis not present

## 2021-10-20 DIAGNOSIS — M5126 Other intervertebral disc displacement, lumbar region: Secondary | ICD-10-CM | POA: Diagnosis not present

## 2021-10-20 DIAGNOSIS — G834 Cauda equina syndrome: Secondary | ICD-10-CM | POA: Diagnosis not present

## 2021-10-20 DIAGNOSIS — M48061 Spinal stenosis, lumbar region without neurogenic claudication: Secondary | ICD-10-CM | POA: Diagnosis not present

## 2021-10-20 MED ORDER — HYDROCODONE-ACETAMINOPHEN 5-325 MG PO TABS: 1.0000 | ORAL_TABLET | ORAL | 0 refills | Status: DC | PRN

## 2021-10-20 MED ORDER — METHOCARBAMOL 500 MG PO TABS: 500.0000 mg | ORAL_TABLET | Freq: Four times a day (QID) | ORAL | 2 refills | Status: DC | PRN

## 2021-10-20 NOTE — Evaluation (Signed)
Occupational Therapy Evaluation Patient Details Name: Sean Serrano MRN: [id number] DOB: Oct 25, 1970 Today's Date: 10/20/2021   History of Present Illness 51 yo M s/p L4-5 discectomy.  PMH includes: Hyperlipidemia.   Clinical Impression   Patient admitted for the procedure above.  PTA he lives with his spouse, and except for the past 3 weeks, was independent with all mobility and care.  Post op discomfort noted.  Currently he is very close to his baseline prior to new onset of radicular pain.  Patient was able to complete ADL, in room mobility and mobility in the halls with no assist.  Car transfers reviewed, precaution sheet issued and reviewed.  No further OT needs in the acute setting and no post acute needs identified.  Recommend follow up with MD as prescribed.       Recommendations for follow up therapy are one component of a multi-disciplinary discharge planning process, led by the attending physician.  Recommendations may be updated based on patient status, additional functional criteria and insurance authorization.   Follow Up Recommendations  No OT follow up    Assistance Recommended at Discharge PRN  Patient can return home with the following      Functional Status Assessment  Patient has not had a recent decline in their functional status  Equipment Recommendations  None recommended by OT    Recommendations for Other Services       Precautions / Restrictions Precautions Precautions: Back Precaution Booklet Issued: Yes (comment) Precaution Comments: verbalized understanding Restrictions Weight Bearing Restrictions: No      Mobility Bed Mobility Overal bed mobility: Modified Independent                  Transfers Overall transfer level: Modified independent                        Balance Overall balance assessment: No apparent balance deficits (not formally assessed)                                         ADL either  performed or assessed with clinical judgement   ADL Overall ADL's : Modified independent                                             Vision Baseline Vision/History: 1 Wears glasses Patient Visual Report: No change from baseline       Perception Perception Perception: Not tested   Praxis Praxis Praxis: Not tested    Pertinent Vitals/Pain Pain Assessment Pain Assessment: Faces Faces Pain Scale: Hurts little more Pain Location: Incision site Pain Descriptors / Indicators: Tender Pain Intervention(s): Monitored during session     Hand Dominance Right   Extremity/Trunk Assessment Upper Extremity Assessment Upper Extremity Assessment: Overall WFL for tasks assessed   Lower Extremity Assessment Lower Extremity Assessment: Overall WFL for tasks assessed   Cervical / Trunk Assessment Cervical / Trunk Assessment: Back Surgery   Communication Communication Communication: No difficulties   Cognition Arousal/Alertness: Awake/alert Behavior During Therapy: WFL for tasks assessed/performed Overall Cognitive Status: Within Functional Limits for tasks assessed  General Comments   VSS on RA    Exercises     Shoulder Instructions      Home Living Family/patient expects to be discharged to:: Private residence Living Arrangements: Spouse/significant other Available Help at Discharge: Family;Available 24 hours/day Type of Home: House Home Access: Stairs to enter [employer] of Steps: 3   Home Layout: One level     Bathroom Shower/Tub: Walk-in shower;Tub/shower unit   Bathroom Toilet: Standard Bathroom Accessibility: Yes How Accessible: Accessible via walker Home Equipment: None          Prior Functioning/Environment Prior Level of Function : Independent/Modified Independent;Working/employed;Driving                        OT Problem List: Pain      OT  Treatment/Interventions:      OT Goals(Current goals can be found in the care plan section) Acute Rehab OT Goals Patient Stated Goal: Return home OT Goal Formulation: With patient Time For Goal Achievement: 10/22/21 Potential to Achieve Goals: Good  OT Frequency:      Co-evaluation              AM-PAC OT "6 Clicks" Daily Activity     Outcome Measure Help from another person eating meals?: None Help from another person taking care of personal grooming?: None Help from another person toileting, which includes using toliet, bedpan, or urinal?: None Help from another person bathing (including washing, rinsing, drying)?: None Help from another person to put on and taking off regular upper body clothing?: None Help from another person to put on and taking off regular lower body clothing?: None 6 Click Score: 24   End of Session Nurse Communication: Mobility status  Activity Tolerance: Patient tolerated treatment well Patient left: in bed;with call bell/phone within reach;with family/visitor present  OT Visit Diagnosis: Unsteadiness on feet (R26.81)                Time: 2952-8413 OT Time Calculation (min): 17 min Charges:  OT General Charges $OT Visit: 1 Visit OT Evaluation $OT Eval Moderate Complexity: 1 Mod  10/20/2021  RP, OTR/L  Acute Rehabilitation Services  Office:  [phone]   Suzanna Obey 10/20/2021, 8:47 AM

## 2021-10-20 NOTE — Anesthesia Postprocedure Evaluation (Signed)
Anesthesia Post Note  Patient: Sean Serrano  Procedure(s) Performed: Bilateral Lumbar four-five  Microdiscectomy (Bilateral: Back)     Patient location during evaluation: PACU Anesthesia Type: General Level of consciousness: awake and alert Pain management: pain level controlled Vital Signs Assessment: post-procedure vital signs reviewed and stable Respiratory status: spontaneous breathing, nonlabored ventilation, respiratory function stable and patient connected to nasal cannula oxygen Cardiovascular status: blood pressure returned to baseline and stable Postop Assessment: no apparent nausea or vomiting Anesthetic complications: no   No notable events documented.  Last Vitals:  Vitals:   10/20/21 0425 10/20/21 0718  BP: (!) 147/88 (!) 149/77  Pulse: 83 88  Resp: 16   Temp: 36.8 C 36.8 C  SpO2: 98% 99%    Last Pain:  Vitals:   10/20/21 0800  TempSrc:   PainSc: 2                  Kennieth Rad

## 2021-10-20 NOTE — Progress Notes (Signed)
Patient is discharged from room 3C10 at this time. Alert and in stable condition. IV site d/c'd and instructions read to patient and spouse with understanding verbalized and all questions answered. Left unit via wheelchair with all belongings at side. 

## 2021-10-20 NOTE — Discharge Instructions (Signed)

## 2021-10-20 NOTE — Discharge Summary (Signed)
Discharge Summary  Date of Admission: 10/19/2021  Date of Discharge: 10/20/21  Attending Physician: Barnett Abu, MD  Hospital Course: Patient was admitted following an uncomplicated L4-5 discectomy. They were recovered in PACU and transferred to [facility]. Their preop symptoms were remarkably better, their hospital course was uncomplicated and the patient was discharged home on 10/20/21. They will follow up in clinic with me in clinic in 2 weeks.  Neurologic exam at discharge:  Improved strength from preop, some residual R foot weakness and numbness  Discharge diagnosis: Lumbar radiculopathy  Jadene Pierini, MD 10/20/21 8:00 AM

## 2021-10-20 NOTE — Progress Notes (Signed)
PT Cancellation Note  Patient Details Name: Sean Serrano MRN: [id number] DOB: 1971/02/28   Cancelled Treatment:    Reason Eval/Treat Not Completed: PT screened, no needs identified, will sign off  Noted pt up walking with OT (and earlier with spouse) independently. OT saw pt and reviewed back precautions, bed mobility, and transfers. Patient reports he has 3 steps to enter but anticipates no problem as he is moving better now that before surgery. Reviewed no exercising other than walking until he sees his MD for post-op appointment. No skilled PT needs at this time and pt/spouse in agreement.    Jerolyn Center, PT Acute Rehabilitation Services  Office [phone]  Zena Amos 10/20/2021, 8:55 AM

## 2021-10-20 NOTE — Progress Notes (Signed)
Neurosurgery Service Progress Note  Subjective: No acute events overnight, feels remarkably better, up and walking down the hallway when I rounded this morning, able to stand up straight for the first time in a while  Objective: Vitals:   10/19/21 1836 10/19/21 2327 10/20/21 0425 10/20/21 0718  BP: (!) 152/94 (!) 150/85 (!) 147/88 (!) 149/77  Pulse: 88 67 83 88  Resp: 18 18 16    Temp: 98 F (36.7 C) 98.5 F (36.9 C) 98.2 F (36.8 C) 98.2 F (36.8 C)  TempSrc: Oral Oral Oral Oral  SpO2: 98% 96% 98% 99%  Weight:      Height:        Physical Exam: Standing and walking, MAEx4 with some right 4 to 4+/5 strength in distal right foot, incision c/d/i  Assessment & Plan: 51 y.o. man s/p large lumbar microdiscectomy, recovering well.  -discharge home today  40  10/20/21 7:59 AM

## 2021-10-21 ENCOUNTER — Encounter (HOSPITAL_COMMUNITY): Payer: Self-pay | Admitting: Neurological Surgery

## 2021-10-22 ENCOUNTER — Other Ambulatory Visit: Payer: Self-pay | Admitting: Neurological Surgery

## 2021-10-22 MED FILL — Thrombin For Soln 5000 Unit: CUTANEOUS | Qty: 2 | Status: AC

## 2021-10-25 ENCOUNTER — Emergency Department (HOSPITAL_COMMUNITY): Payer: BC Managed Care – PPO

## 2021-10-25 ENCOUNTER — Encounter (HOSPITAL_COMMUNITY): Payer: Self-pay

## 2021-10-25 ENCOUNTER — Inpatient Hospital Stay (HOSPITAL_COMMUNITY)
Admission: EM | Admit: 2021-10-25 | Discharge: 2021-10-28 | DRG: 164 | Disposition: A | Discharge status: Home / Self Care | Payer: BC Managed Care – PPO | Attending: Pulmonary Disease | Admitting: Pulmonary Disease

## 2021-10-25 ENCOUNTER — Other Ambulatory Visit: Payer: Self-pay

## 2021-10-25 DIAGNOSIS — I2721 Secondary pulmonary arterial hypertension: Secondary | ICD-10-CM | POA: Diagnosis present

## 2021-10-25 DIAGNOSIS — I82412 Acute embolism and thrombosis of left femoral vein: Secondary | ICD-10-CM | POA: Diagnosis not present

## 2021-10-25 DIAGNOSIS — I2699 Other pulmonary embolism without acute cor pulmonale: Secondary | ICD-10-CM | POA: Diagnosis present

## 2021-10-25 DIAGNOSIS — M79605 Pain in left leg: Secondary | ICD-10-CM | POA: Diagnosis not present

## 2021-10-25 DIAGNOSIS — I82442 Acute embolism and thrombosis of left tibial vein: Secondary | ICD-10-CM | POA: Diagnosis present

## 2021-10-25 DIAGNOSIS — Z8249 Family history of ischemic heart disease and other diseases of the circulatory system: Secondary | ICD-10-CM

## 2021-10-25 DIAGNOSIS — E785 Hyperlipidemia, unspecified: Secondary | ICD-10-CM | POA: Diagnosis present

## 2021-10-25 DIAGNOSIS — R0902 Hypoxemia: Secondary | ICD-10-CM | POA: Diagnosis not present

## 2021-10-25 DIAGNOSIS — M5416 Radiculopathy, lumbar region: Secondary | ICD-10-CM | POA: Diagnosis present

## 2021-10-25 DIAGNOSIS — I2602 Saddle embolus of pulmonary artery with acute cor pulmonale: Principal | ICD-10-CM | POA: Diagnosis present

## 2021-10-25 DIAGNOSIS — I2692 Saddle embolus of pulmonary artery without acute cor pulmonale: Secondary | ICD-10-CM

## 2021-10-25 DIAGNOSIS — J9601 Acute respiratory failure with hypoxia: Secondary | ICD-10-CM

## 2021-10-25 DIAGNOSIS — Z86711 Personal history of pulmonary embolism: Secondary | ICD-10-CM | POA: Diagnosis not present

## 2021-10-25 DIAGNOSIS — M79604 Pain in right leg: Secondary | ICD-10-CM | POA: Diagnosis not present

## 2021-10-25 DIAGNOSIS — I82433 Acute embolism and thrombosis of popliteal vein, bilateral: Secondary | ICD-10-CM | POA: Diagnosis present

## 2021-10-25 DIAGNOSIS — I82452 Acute embolism and thrombosis of left peroneal vein: Secondary | ICD-10-CM | POA: Diagnosis present

## 2021-10-25 DIAGNOSIS — Z86718 Personal history of other venous thrombosis and embolism: Secondary | ICD-10-CM | POA: Diagnosis not present

## 2021-10-25 DIAGNOSIS — I2693 Single subsegmental pulmonary embolism without acute cor pulmonale: Secondary | ICD-10-CM | POA: Diagnosis not present

## 2021-10-25 DIAGNOSIS — M48061 Spinal stenosis, lumbar region without neurogenic claudication: Secondary | ICD-10-CM | POA: Diagnosis not present

## 2021-10-25 DIAGNOSIS — R079 Chest pain, unspecified: Secondary | ICD-10-CM | POA: Diagnosis not present

## 2021-10-25 DIAGNOSIS — R000 Tachycardia, unspecified: Secondary | ICD-10-CM | POA: Diagnosis not present

## 2021-10-25 DIAGNOSIS — R42 Dizziness and giddiness: Secondary | ICD-10-CM | POA: Diagnosis not present

## 2021-10-25 HISTORY — PX: IR US GUIDE VASC ACCESS RIGHT: IMG2390

## 2021-10-25 HISTORY — PX: IR ANGIOGRAM PULMONARY BILATERAL SELECTIVE: IMG664

## 2021-10-25 HISTORY — PX: IR ANGIOGRAM SELECTIVE EACH ADDITIONAL VESSEL: IMG667

## 2021-10-25 HISTORY — PX: IR THROMBECT PRIM MECH ADD (INCLU) MOD SED: IMG2298

## 2021-10-25 HISTORY — PX: IR THROMBECT PRIM MECH INIT (INCLU) MOD SED: IMG2297

## 2021-10-25 LAB — CBC WITH DIFFERENTIAL/PLATELET
Abs Immature Granulocytes: 0.09 10*3/uL — ABNORMAL HIGH (ref 0.00–0.07)
Basophils Absolute: 0 10*3/uL (ref 0.0–0.1)
Basophils Relative: 0 %
Eosinophils Absolute: 0.4 10*3/uL (ref 0.0–0.5)
Eosinophils Relative: 3 %
HCT: 41.2 % (ref 39.0–52.0)
Hemoglobin: 14.1 g/dL (ref 13.0–17.0)
Immature Granulocytes: 1 %
Lymphocytes Relative: 19 %
Lymphs Abs: 2 10*3/uL (ref 0.7–4.0)
MCH: 32.3 pg (ref 26.0–34.0)
MCHC: 34.2 g/dL (ref 30.0–36.0)
MCV: 94.3 fL (ref 80.0–100.0)
Monocytes Absolute: 0.9 10*3/uL (ref 0.1–1.0)
Monocytes Relative: 9 %
Neutro Abs: 6.9 10*3/uL (ref 1.7–7.7)
Neutrophils Relative %: 68 %
Platelets: 153 10*3/uL (ref 150–400)
RBC: 4.37 MIL/uL (ref 4.22–5.81)
RDW: 12.2 % (ref 11.5–15.5)
WBC: 10.2 10*3/uL (ref 4.0–10.5)
nRBC: 0 % (ref 0.0–0.2)

## 2021-10-25 LAB — GLUCOSE, CAPILLARY
Glucose-Capillary: 165 mg/dL — ABNORMAL HIGH (ref 70–99)
Glucose-Capillary: 146 mg/dL — ABNORMAL HIGH (ref 70–99)

## 2021-10-25 LAB — COMPREHENSIVE METABOLIC PANEL
ALT: 71 U/L — ABNORMAL HIGH (ref 0–44)
AST: 34 U/L (ref 15–41)
Albumin: 3.2 g/dL — ABNORMAL LOW (ref 3.5–5.0)
Alkaline Phosphatase: 52 U/L (ref 38–126)
Anion gap: 8 (ref 5–15)
BUN: 20 mg/dL (ref 6–20)
CO2: 22 mmol/L (ref 22–32)
Calcium: 8.3 mg/dL — ABNORMAL LOW (ref 8.9–10.3)
Chloride: 113 mmol/L — ABNORMAL HIGH (ref 98–111)
Creatinine, Ser: 1.2 mg/dL (ref 0.61–1.24)
GFR, Estimated: >60 mL/min (ref >60)
Glucose, Bld: 105 mg/dL — ABNORMAL HIGH (ref 70–99)
Potassium: 4.1 mmol/L (ref 3.5–5.1)
Sodium: 143 mmol/L (ref 135–145)
Total Bilirubin: 0.8 mg/dL (ref 0.3–1.2)
Total Protein: 6.1 g/dL — ABNORMAL LOW (ref 6.5–8.1)

## 2021-10-25 LAB — POCT ACTIVATED CLOTTING TIME
Activated Clotting Time: 149 seconds
Activated Clotting Time: 185 seconds

## 2021-10-25 LAB — PROTIME-INR
INR: 1.1 (ref 0.8–1.2)
Prothrombin Time: 14.4 seconds (ref 11.4–15.2)

## 2021-10-25 LAB — TROPONIN I (HIGH SENSITIVITY)
Troponin I (High Sensitivity): 610 ng/L (ref <18)
Troponin I (High Sensitivity): 4536 ng/L (ref <18)

## 2021-10-25 LAB — LACTIC ACID, PLASMA: Lactic Acid, Venous: 1.9 mmol/L (ref 0.5–1.9)

## 2021-10-25 LAB — BRAIN NATRIURETIC PEPTIDE
B Natriuretic Peptide: 6 pg/mL (ref 0.0–100.0)
B Natriuretic Peptide: 26.4 pg/mL (ref 0.0–100.0)

## 2021-10-25 LAB — HIV ANTIBODY (ROUTINE TESTING W REFLEX): HIV Screen 4th Generation wRfx: NONREACTIVE

## 2021-10-25 LAB — HEPARIN LEVEL (UNFRACTIONATED): Heparin Unfractionated: 0.77 IU/mL — ABNORMAL HIGH (ref 0.30–0.70)

## 2021-10-25 LAB — MRSA NEXT GEN BY PCR, NASAL: MRSA by PCR Next Gen: NOT DETECTED

## 2021-10-25 MED ORDER — HEPARIN SODIUM (PORCINE) 1000 UNIT/ML IJ SOLN
INTRAMUSCULAR | Status: AC | PRN
  Administered 2021-10-25: 2000 [IU] via INTRAVENOUS
  Administered 2021-10-25: 5000 [IU] via INTRAVENOUS

## 2021-10-25 MED ORDER — IOHEXOL 300 MG/ML  SOLN
100.0000 mL | Freq: Once | INTRAMUSCULAR | Status: AC | PRN
  Administered 2021-10-25: 25 mL via INTRA_ARTERIAL

## 2021-10-25 MED ORDER — FENTANYL CITRATE (PF) 100 MCG/2ML IJ SOLN
INTRAMUSCULAR | Status: AC | PRN
  Administered 2021-10-25: 25 ug via INTRAVENOUS
  Administered 2021-10-25: 50 ug via INTRAVENOUS
  Administered 2021-10-25: 25 ug via INTRAVENOUS

## 2021-10-25 MED ORDER — IOHEXOL 350 MG/ML SOLN
100.0000 mL | Freq: Once | INTRAVENOUS | Status: AC | PRN
  Administered 2021-10-25: 100 mL via INTRAVENOUS

## 2021-10-25 MED ORDER — HEPARIN (PORCINE) 25000 UT/250ML-% IV SOLN
1500.0000 [IU]/h | INTRAVENOUS | Status: AC
  Administered 2021-10-25: 1550 [IU]/h via INTRAVENOUS
  Administered 2021-10-26 (×2): 1450 [IU]/h via INTRAVENOUS
  Administered 2021-10-27: 1500 [IU]/h via INTRAVENOUS
  Filled 2021-10-25 (×4): qty 250

## 2021-10-25 MED ORDER — POLYETHYLENE GLYCOL 3350 17 G PO PACK: 17.0000 g | PACK | Freq: Every day | ORAL | Status: DC | PRN

## 2021-10-25 MED ORDER — DOCUSATE SODIUM 100 MG PO CAPS: 100.0000 mg | ORAL_CAPSULE | Freq: Two times a day (BID) | ORAL | Status: DC | PRN

## 2021-10-25 MED ORDER — FENTANYL CITRATE (PF) 100 MCG/2ML IJ SOLN
INTRAMUSCULAR | Status: AC
  Filled 2021-10-25: qty 2

## 2021-10-25 MED ORDER — IOHEXOL 300 MG/ML  SOLN: 100.0000 mL | Freq: Once | INTRAMUSCULAR | Status: DC | PRN

## 2021-10-25 MED ORDER — MIDAZOLAM HCL 2 MG/2ML IJ SOLN
INTRAMUSCULAR | Status: AC | PRN
  Administered 2021-10-25: 2 mg via INTRAVENOUS

## 2021-10-25 MED ORDER — HEPARIN BOLUS VIA INFUSION
6500.0000 [IU] | Freq: Once | INTRAVENOUS | Status: DC
  Filled 2021-10-25: qty 6500

## 2021-10-25 MED ORDER — LIDOCAINE HCL 1 % IJ SOLN
INTRAMUSCULAR | Status: AC
  Administered 2021-10-25: 10 mL
  Filled 2021-10-25: qty 20

## 2021-10-25 MED ORDER — LACTATED RINGERS IV BOLUS
1000.0000 mL | Freq: Once | INTRAVENOUS | Status: AC
  Administered 2021-10-25: 1000 mL via INTRAVENOUS

## 2021-10-25 MED ORDER — MIDAZOLAM HCL 2 MG/2ML IJ SOLN
INTRAMUSCULAR | Status: AC
  Filled 2021-10-25: qty 2

## 2021-10-25 NOTE — H&P (Signed)
NAME:  Sean Serrano, MRN:  [id number], DOB:  1970-11-02, LOS: 0 ADMISSION DATE:  10/25/2021, CONSULTATION DATE:  10/25/21 REFERRING MD:  Stevie Kern - EM, CHIEF COMPLAINT:  Chest pain, syncope    History of Present Illness:  51 yo M PMH L4-L5 herniated disc with L4-L5 spinal stenosis and cauda equina who is POD 6 L4-L5 bilateral lumbar microdiscectomy (OR 10/19/21 [facility]). Post op course uncomplicated and he was discharged home 7/22.   Pt presented to experienced acute onset chest pain and felt like he was going to pass out to he sat down after walking in driveway.  Reports walking to mail box since surgery to get exercise.  He reports period of not remembering anything but then felt better, opted not to call 911, and called his wife to take him.  Had near syncopal episode in car, wife reports he was gray enroute to ER.  At baseline prior to May and his back issues, patient very active bicyclist and hiker.  He reports prior history of DVT after knee surgery treated with lovenox/ coumadin and again in LE but "wasn't the serious DVT" and treated with aspirin.  Also reports family history of blood clots in his father and sister.   Denied lower calf pain or swelling but did have new soreness/pain in left thigh today and mild shortness of breath.   In ER, he was noted to be tachycardic 120s and with initial room air saturations at 88% requiring 2LNC, and blood pressure normotensive. CTA chest obtained in ED which revealed saddle PE, evidence of R heart strain with RV:LV 1.62.  Labs pending.  Heparin gtt started.  Patient still complains of chest pressure.  EKG non acute.  PCCM consulted.   Pertinent  Medical History  Cauda equina, L4-L5 spinal stenosis and herniated nucleus pulposus  HLD ED Prior hx of DVT x2 (lovenox/ coumadin first- provoked after knee surgery, second on ASA only)  Significant Hospital Events: Including procedures, antibiotic start and stop dates in addition to other pertinent events    7/27 Saddle PE + syncope, tachycardia, mild hypoxia, Rv:LV on CTA chest 1.62.   PCCM consulting IR for possible thrombectomy in setting of recent lumbar surgery   Interim History / Subjective:   Objective   Blood pressure 119/84, pulse (!) 120, temperature (!) 97.4 F (36.3 C), temperature source Oral, resp. rate 19, height 5\' 9"  (1.753 m), weight 85.8 kg, SpO2 91 %.        Intake/Output Summary (Last 24 hours) at 10/25/2021 1508 Last data filed at 10/25/2021 1351 Gross per 24 hour  Intake 1600 ml  Output --  Net 1600 ml   Filed Weights   10/25/21 1403  Weight: 85.8 kg    Examination: General:  Adult male sitting upright on ER stretcher, appears mildly anxious HEENT: MM pink/moist Neuro: Aox3, MAE CV: rr, ST, no murmur PULM:  mildly tachypneic in 20's, lungs clear GI: soft, bs+  Extremities: warm/dry, no calf tenderness/ erythema/ edema  Skin: no rashes   Resolved Hospital Problem list    Assessment & Plan:   Submassive PE  - saddle PE, RV:LV 1.62 on CTA - PESI score 103, intermediate risk - remains tachycardic, BP stable, and on 2L [place] - admit to ICU for close monitoring.  High risk of decompensation.  - NPO, last ate cereal around 10am. - given he is post op day 6 from lumbar surgery, he is not a candidate for systemic lytics.  - IR consulted for thrombectomy.   -  CBC and INR wnl, BNP 6, other labs pending including CMET, troponin, and lactic  - heparin gtt per pharmacy  - wean O2 for goal sat > 94% - TTE and LE dopplers ordered - given family hx of blood clots and prior DVT x 2 (one provoked), recommend outpt hypercoagulable workup  Lumbar radiculopathy s/p L4/L5 discectomy on 7/21 by Dr. Danielle Dess - NSGY aware of patient  HLD - can continue lipitor 7/28 pending LFTs  Best Practice (right click and "Reselect all SmartList Selections" daily)   Diet/type: NPO DVT prophylaxis: systemic heparin GI prophylaxis: N/A Lines: N/A Foley:  N/A Code Status:  full  code Last date of multidisciplinary goals of care discussion [7/27]  Patient and wife at bedside updated on plan of care.   Labs   CBC: Recent Labs  Lab 10/25/21 1416  WBC 10.2  NEUTROABS 6.9  HGB 14.1  HCT 41.2  MCV 94.3  PLT 153    Basic Metabolic Panel: No results for input(s): "NA", "K", "CL", "CO2", "GLUCOSE", "BUN", "CREATININE", "CALCIUM", "MG", "PHOS" in the last 168 hours. GFR: Estimated Creatinine Clearance: 90 mL/min (by C-G formula based on SCr of 1.03 mg/dL). Recent Labs  Lab 10/25/21 1416  WBC 10.2    Liver Function Tests: No results for input(s): "AST", "ALT", "ALKPHOS", "BILITOT", "PROT", "ALBUMIN" in the last 168 hours. No results for input(s): "LIPASE", "AMYLASE" in the last 168 hours. No results for input(s): "AMMONIA" in the last 168 hours.  ABG No results found for: "PHART", "PCO2ART", "PO2ART", "HCO3", "TCO2", "ACIDBASEDEF", "O2SAT"   Coagulation Profile: No results for input(s): "INR", "PROTIME" in the last 168 hours.  Cardiac Enzymes: No results for input(s): "CKTOTAL", "CKMB", "CKMBINDEX", "TROPONINI" in the last 168 hours.  HbA1C: Hgb A1c MFr Bld  Date/Time Value Ref Range Status  12/22/2018 08:09 AM 5.1 <5.7 % of total Hgb Final    Comment:    For the purpose of screening for the presence of diabetes: . <5.7%       Consistent with the absence of diabetes 5.7-6.4%    Consistent with increased risk for diabetes             (prediabetes) > or =6.5%  Consistent with diabetes . This assay result is consistent with a decreased risk of diabetes. . Currently, no consensus exists regarding use of hemoglobin A1c for diagnosis of diabetes in children. . According to American Diabetes Association (ADA) guidelines, hemoglobin A1c <7.0% represents optimal control in non-pregnant diabetic patients. Different metrics may apply to specific patient populations.  Standards of Medical Care in Diabetes(ADA). Marland Kitchen   07/13/2018 08:32 AM 5.3 <5.7 %  of total Hgb Final    Comment:    For the purpose of screening for the presence of diabetes: . <5.7%       Consistent with the absence of diabetes 5.7-6.4%    Consistent with increased risk for diabetes             (prediabetes) > or =6.5%  Consistent with diabetes . This assay result is consistent with a decreased risk of diabetes. . Currently, no consensus exists regarding use of hemoglobin A1c for diagnosis of diabetes in children. . According to American Diabetes Association (ADA) guidelines, hemoglobin A1c <7.0% represents optimal control in non-pregnant diabetic patients. Different metrics may apply to specific patient populations.  Standards of Medical Care in Diabetes(ADA). .     CBG: No results for input(s): "GLUCAP" in the last 168 hours.  Review of Systems:   Review  of Systems  Respiratory:  Positive for shortness of breath. Negative for hemoptysis.   Cardiovascular:  Positive for chest pain. Negative for leg swelling.  Neurological:  Positive for dizziness. Negative for focal weakness and loss of consciousness.    Past Medical History:  He,  has a past medical history of ED (erectile dysfunction), Hyperlipidemia, and PONV (postoperative nausea and vomiting).   Surgical History:   Past Surgical History:  Procedure Laterality Date   ac separation     BACK SURGERY     LUMBAR LAMINECTOMY/DECOMPRESSION MICRODISCECTOMY Bilateral 10/19/2021   Procedure: Bilateral Lumbar four-five  Microdiscectomy;  Surgeon: Barnett Abu, MD;  Location: [facility] OR;  Service: Neurosurgery;  Laterality: Bilateral;   SHOULDER SURGERY       Social History:   reports that he has never smoked. He has never used smokeless tobacco. He reports current alcohol use of about 12.0 standard drinks of alcohol per week. He reports that he does not use drugs.   Family History:  His family history includes Healthy in his mother.   Allergies No Known Allergies   Home Medications  Prior to  Admission medications   Medication Sig Start Date End Date Taking? Authorizing Provider  atorvastatin (LIPITOR) 10 MG tablet Take 1 tablet (10 mg total) by mouth daily. 12/11/20   Monica Becton, MD  calcium carbonate (TUMS EX) 750 MG chewable tablet Chew 750 mg by mouth daily as needed for heartburn.    [provider]  famotidine (PEPCID) 20 MG tablet Take 20 mg by mouth daily.    [provider]  HYDROcodone-acetaminophen (NORCO/VICODIN) 5-325 MG tablet Take 1-2 tablets by mouth every 4 (four) hours as needed (pain). 10/20/21   Jadene Pierini, MD  methocarbamol (ROBAXIN) 500 MG tablet Take 1 tablet (500 mg total) by mouth every 6 (six) hours as needed for muscle spasms. 10/20/21   Jadene Pierini, MD  naproxen (NAPROSYN) 500 MG tablet TAKE 1 TABLET BY MOUTH 2 TIMES DAILY WITH A MEAL. Patient taking differently: Take 500 mg by mouth 2 (two) times daily with a meal. 08/13/21   Monica Becton, MD  tadalafil (CIALIS) 5 MG tablet Take 1 tablet (5 mg total) by mouth daily. 12/11/20   Monica Becton, MD     Critical care time: 45 mins     Posey Boyer, ACNP [facility] 10/25/2021, 4:05 PM  See Amion for pager If no response to pager, please call PCCM consult pager After 7:00 pm call [facility]

## 2021-10-25 NOTE — ED Triage Notes (Signed)
Pt to ED via [facility] c/o dizziness and chest pressure. Pt had back surgery last Friday. Poor PO intake since surgery. Pt has history of DVT. Pt is 88% on RA during triage. Placed on 2L [place].

## 2021-10-25 NOTE — Progress Notes (Signed)
ANTICOAGULATION CONSULT NOTE - Initial Consult  Pharmacy Consult for Heparin Indication: pulmonary embolus  No Known Allergies  Patient Measurements: Height: 5\' 9"  (175.3 cm) Weight: 85.8 kg (189 lb 2.5 oz) IBW/kg (Calculated) : 70.7 Heparin Dosing Weight: 85.8 kg  Vital Signs: Temp: 98.4 F (36.9 C) (07/27 2000) Temp Source: Oral (07/27 2000) BP: 129/94 (07/27 2200) Pulse Rate: 102 (07/27 2200)  Labs: Recent Labs    10/25/21 1416 10/25/21 1511 10/25/21 1843 10/25/21 2157  HGB 14.1  --   --   --   HCT 41.2  --   --   --   PLT 153  --   --   --   LABPROT  --  14.4  --   --   INR  --  1.1  --   --   HEPARINUNFRC  --   --   --  0.77*  CREATININE 1.20  --   --   --   TROPONINIHS 610*  --  4,536*  --      Estimated Creatinine Clearance: 77.2 mL/min (by C-G formula based on SCr of 1.2 mg/dL).   Medical History: Past Medical History:  Diagnosis Date   ED (erectile dysfunction)    Hyperlipidemia    PONV (postoperative nausea and vomiting)     Medications:  Medications Prior to Admission  Medication Sig Dispense Refill Last Dose   atorvastatin (LIPITOR) 10 MG tablet Take 1 tablet (10 mg total) by mouth daily. 90 tablet 3    calcium carbonate (TUMS EX) 750 MG chewable tablet Chew 750 mg by mouth daily as needed for heartburn.      famotidine (PEPCID) 20 MG tablet Take 20 mg by mouth daily.      HYDROcodone-acetaminophen (NORCO/VICODIN) 5-325 MG tablet Take 1-2 tablets by mouth every 4 (four) hours as needed (pain). 30 tablet 0    methocarbamol (ROBAXIN) 500 MG tablet Take 1 tablet (500 mg total) by mouth every 6 (six) hours as needed for muscle spasms. 30 tablet 2    naproxen (NAPROSYN) 500 MG tablet TAKE 1 TABLET BY MOUTH 2 TIMES DAILY WITH A MEAL. (Patient taking differently: Take 500 mg by mouth 2 (two) times daily with a meal.) 60 tablet 3    tadalafil (CIALIS) 5 MG tablet Take 1 tablet (5 mg total) by mouth daily. 30 tablet 11     Scheduled:   heparin  6,500  Units Intravenous Once   Infusions:   heparin 1,550 Units/hr (10/25/21 2000)   PRN:   Assessment: 51 yom with a history of HLD, s/p large lumbar microdiscectomy7/21/23. Patient is presenting with dizziness and chest pain. Patient was not on anticoagulation prior to admission. Heparin per pharmacy consult placed for pulmonary embolus. Found to have saddle PE in the ED. Pt s/p IR mechanical thrombectomy on 7/27. Initially with bleeding from L femoral dressing site, quik-clot applied and hemostasis achieved. No further signs of bleeding noted.   Heparin level = 0.77 (drawn ~5 hours after last bolus) Current heparin infusion rate: 1550 units/hr Pt received 2 heparin boluses in IR - 5000 units at 1629 and 2000 units at 1651  Goal of Therapy:  Heparin level 0.3-0.7 units/ml Monitor platelets by anticoagulation protocol: Yes   Plan:  Decrease heparin infusion rate to 1450 units/hr Check anti-Xa level in 6 hours and daily while on heparin Continue to monitor H&H and platelets F/u plan to transition to oral anticoagulation once stable  8/27, PharmD, BCPS Clinical Pharmacist 10/25/2021 11:01 PM  Please refer to [facility] for pharmacy phone number

## 2021-10-25 NOTE — Procedures (Signed)
Interventional Radiology Procedure Note  Procedure: Pulmonary angiogram and bilateral mechanical thrombectomy.   Complications: Mild chest pain and vasovagal response, self limited  Estimated Blood Loss: 50 mL  Recommendations: - Bedrest tonight - Right femoral vein puncture checks - ECHO    Signed,  Sterling Big, MD

## 2021-10-25 NOTE — Sedation Documentation (Signed)
Main PA Pressure  51/31 (38)

## 2021-10-25 NOTE — Progress Notes (Signed)
ANTICOAGULATION CONSULT NOTE - Initial Consult  Pharmacy Consult for Heparin Indication: pulmonary embolus  No Known Allergies  Patient Measurements: Height: 5\' 9"  (175.3 cm) Weight: 85.8 kg (189 lb 2.5 oz) IBW/kg (Calculated) : 70.7 Heparin Dosing Weight: 85.8 kg  Vital Signs: Temp: 97.4 F (36.3 C) (07/27 1357) Temp Source: Oral (07/27 1357) BP: 119/84 (07/27 1400) Pulse Rate: 120 (07/27 1400)  Labs: Recent Labs    10/25/21 1416  HGB 14.1  HCT 41.2  PLT 153    Estimated Creatinine Clearance: 90 mL/min (by C-G formula based on SCr of 1.03 mg/dL).   Medical History: Past Medical History:  Diagnosis Date   ED (erectile dysfunction)    Hyperlipidemia    PONV (postoperative nausea and vomiting)     Medications:  (Not in a hospital admission)  Scheduled:   heparin  6,500 Units Intravenous Once   Infusions:   heparin     lactated ringers     PRN:   Assessment: 51 yom with a history of HLD, s/p large lumbar microdiscectomy7/21/23. Patient is presenting with dizziness and chest pain. Heparin per pharmacy consult placed for pulmonary embolus. Found to have saddle PE in the ED.  Patient is not on anticoagulation prior to arrival.  Hgb 14.1; plt 153  Goal of Therapy:  Heparin level 0.3-0.7 units/ml Monitor platelets by anticoagulation protocol: Yes   Plan:  Give 6500 units bolus x 1 Start heparin infusion at 1550 units/hr Check anti-Xa level in 6 hours and daily while on heparin Continue to monitor H&H and platelets  10/21/21, PharmD, BCPS 10/25/2021 2:56 PM ED Clinical Pharmacist -  [phone]

## 2021-10-25 NOTE — Sedation Documentation (Signed)
Notable mottling to hands and fingers bilaterally, hips and lower extremity. Cap refill greater than 3 seconds

## 2021-10-25 NOTE — ED Provider Notes (Signed)
[facility] EMERGENCY DEPARTMENT Provider Note   CSN: [id number] Arrival date & time: 10/25/21  1350     History  Chief Complaint  Patient presents with   Dizziness   Chest Pain    Sean Serrano is a 51 y.o. male.  Sean Serrano is a 51 y.o. male with a history of prior DVT, hyperlipidemia and recent back surgery, who presents to the emergency department via EMS for evaluation of dizziness and chest pain.  Patient reports about 2 hours prior to arrival he was walking up and down his driveway to get exercise after his recent back surgery, he reports he had sudden onset of dizziness and had to sit down on the ground and with this had central chest pain.  He reports this chest pain is a constant pressure but he feels it more intensely when taking in a deep breath.  He reports feeling slightly short of breath.  When patient arrived in the department he was satting 88% on room air and was placed on 2 L nasal cannula.  He reports that he called his wife when symptoms started in the driveway and she came home from work, tried to put him in the car to bring him to the hospital but then he started to feel extremely lightheaded like he was going to pass out again and so she called EMS.  Patient noted to be tachycardic with EMS but normotensive.  He had a microdiscectomy with Dr. Danielle Dess 4 days ago which she has had no complications from and has been up walking and doing regular activities.  He does report prior history of 2 DVTs many years ago, not currently on any anticoagulation.  Was not placed on aspirin or anticoagulation after recent surgery.  The history is provided by the patient and the spouse.  Dizziness Associated symptoms: chest pain and shortness of breath   Associated symptoms: no nausea and no vomiting   Chest Pain Associated symptoms: dizziness and shortness of breath   Associated symptoms: no abdominal pain, no back pain, no cough, no fever, no nausea and no  vomiting        Home Medications Prior to Admission medications   Medication Sig Start Date End Date Taking? Authorizing Provider  atorvastatin (LIPITOR) 10 MG tablet Take 1 tablet (10 mg total) by mouth daily. 12/11/20   Monica Becton, MD  calcium carbonate (TUMS EX) 750 MG chewable tablet Chew 750 mg by mouth daily as needed for heartburn.    [provider]  famotidine (PEPCID) 20 MG tablet Take 20 mg by mouth daily.    [provider]  HYDROcodone-acetaminophen (NORCO/VICODIN) 5-325 MG tablet Take 1-2 tablets by mouth every 4 (four) hours as needed (pain). 10/20/21   Jadene Pierini, MD  methocarbamol (ROBAXIN) 500 MG tablet Take 1 tablet (500 mg total) by mouth every 6 (six) hours as needed for muscle spasms. 10/20/21   Jadene Pierini, MD  naproxen (NAPROSYN) 500 MG tablet TAKE 1 TABLET BY MOUTH 2 TIMES DAILY WITH A MEAL. Patient taking differently: Take 500 mg by mouth 2 (two) times daily with a meal. 08/13/21   Monica Becton, MD  tadalafil (CIALIS) 5 MG tablet Take 1 tablet (5 mg total) by mouth daily. 12/11/20   Monica Becton, MD      Allergies    Patient has no known allergies.    Review of Systems   Review of Systems  Constitutional:  Negative for chills  and fever.  HENT: Negative.    Respiratory:  Positive for shortness of breath. Negative for cough.   Cardiovascular:  Positive for chest pain. Negative for leg swelling.  Gastrointestinal:  Negative for abdominal pain, nausea and vomiting.  Musculoskeletal:  Negative for back pain.  Neurological:  Positive for dizziness and light-headedness. Negative for syncope.    Physical Exam Updated Vital Signs BP (!) 122/92 (BP Location: Left Arm)   Pulse (!) 118   Temp (!) 97.4 F (36.3 C) (Oral)   Resp (!) 21   Ht 5\' 9"  (1.753 m)   Wt 85.8 kg   SpO2 93%   BMI 27.93 kg/m  Physical Exam Vitals and nursing note reviewed.  Constitutional:      General: He is not in acute  distress.    Appearance: Normal appearance. He is well-developed. He is ill-appearing. He is not diaphoretic.     Comments: Patient is alert, mildly diaphoretic and ill-appearing  HENT:     Head: Normocephalic and atraumatic.  Eyes:     General:        Right eye: No discharge.        Left eye: No discharge.     Pupils: Pupils are equal, round, and reactive to light.  Cardiovascular:     Rate and Rhythm: Regular rhythm. Tachycardia present.     Pulses: Normal pulses.          Radial pulses are 2+ on the right side and 2+ on the left side.       Dorsalis pedis pulses are 2+ on the right side and 2+ on the left side.     Heart sounds: Normal heart sounds. No murmur heard.    No friction rub. No gallop.  Pulmonary:     Effort: No respiratory distress.     Breath sounds: Normal breath sounds. No wheezing or rales.     Comments: Patient is tachypneic, requiring 2 L nasal cannula with sats in the low-mid 90s, able to speak in full sentences, lungs clear to auscultation bilaterally without wheezes, rales or rhonchi Abdominal:     General: Bowel sounds are normal. There is no distension.     Palpations: Abdomen is soft. There is no mass.     Tenderness: There is no abdominal tenderness. There is no guarding.     Comments: Abdomen soft, nondistended, nontender to palpation in all quadrants without guarding or peritoneal signs  Musculoskeletal:        General: No deformity.     Cervical back: Neck supple.     Right lower leg: No tenderness. No edema.     Left lower leg: No tenderness. No edema.     Comments: Surgical scar noted over the lumbar spine with no overlying erythema or drainage  Skin:    General: Skin is warm and dry.     Capillary Refill: Capillary refill takes less than 2 seconds.  Neurological:     Mental Status: He is alert and oriented to person, place, and time.     Coordination: Coordination normal.     Comments: Speech is clear, able to follow commands Moves extremities  without ataxia, coordination intact  Psychiatric:        Mood and Affect: Mood normal.        Behavior: Behavior normal.     ED Results / Procedures / Treatments   Labs (all labs ordered are listed, but only abnormal results are displayed) Labs Reviewed  CBC WITH DIFFERENTIAL/PLATELET -  Abnormal; Notable for the following components:      Result Value   Abs Immature Granulocytes 0.09 (*)    All other components within normal limits  COMPREHENSIVE METABOLIC PANEL - Abnormal; Notable for the following components:   Chloride 113 (*)    Glucose, Bld 105 (*)    Calcium 8.3 (*)    Total Protein 6.1 (*)    Albumin 3.2 (*)    ALT 71 (*)    All other components within normal limits  TROPONIN I (HIGH SENSITIVITY) - Abnormal; Notable for the following components:   Troponin I (High Sensitivity) 610 (*)    All other components within normal limits  BRAIN NATRIURETIC PEPTIDE  PROTIME-INR  LACTIC ACID, PLASMA  HEPARIN LEVEL (UNFRACTIONATED)  BRAIN NATRIURETIC PEPTIDE  HIV ANTIBODY (ROUTINE TESTING W REFLEX)  TROPONIN I (HIGH SENSITIVITY)    EKG EKG Interpretation  Date/Time:  Thursday October 25 2021 13:56:15 EDT Ventricular Rate:  118 PR Interval:  117 QRS Duration: 116 QT Interval:  326 QTC Calculation: 457 R Axis:   44 Text Interpretation: Sinus tachycardia Incomplete right bundle branch block Borderline ST depression, lateral leads Confirmed by Marianna Fuss ([id number]) on 10/25/2021 2:15:37 PM  Radiology CT Angio Chest PE W and/or Wo Contrast  Result Date: 10/25/2021 CLINICAL DATA:  Pulmonary embolism (PE) suspected, high prob EXAM: CT ANGIOGRAPHY CHEST WITH CONTRAST TECHNIQUE: Multidetector CT imaging of the chest was performed using the standard protocol during bolus administration of intravenous contrast. Multiplanar CT image reconstructions and MIPs were obtained to evaluate the vascular anatomy. RADIATION DOSE REDUCTION: This exam was performed according to the departmental  dose-optimization program which includes automated exposure control, adjustment of the mA and/or kV according to patient size and/or use of iterative reconstruction technique. CONTRAST:  OMNIPAQUE IOHEXOL 350 MG/ML SOLN COMPARISON:  Chest radiograph from the same day. FINDINGS: Cardiovascular: Positive for acute PE extending into the left and right pulmonary arteries (saddle pulmonary embolism) as well as more distal lobar, segmental and subsegmental pulmonary arteries bilaterally. Evidence of right heart strain (RV/LV Ratio = 1.62). No pericardial effusion. Borderline cardiomegaly. Mediastinum/Nodes: No enlarged mediastinal, hilar, or axillary lymph nodes. Thyroid gland, trachea, and esophagus demonstrate no significant findings. Lungs/Pleura: Lungs are clear. No pleural effusion or pneumothorax. Upper Abdomen: No acute abnormality. Musculoskeletal: No chest wall abnormality. No acute or significant osseous findings. Review of the MIP images confirms the above findings. IMPRESSION: Positive for acute saddle pulmonary embolism (detailed above) with CT evidence of right heart strain (RV/LV Ratio = 1.62) consistent with at least submassive (intermediate risk) PE. The presence of right heart strain has been associated with an increased risk of morbidity and mortality. Please refer to the "Code PE Focused" order set in EPIC. Findings discussed with Dr. Buck Mam via telephone at 3:05 p.m. Electronically Signed   By: Feliberto Harts M.D.   On: 10/25/2021 15:06   DG Chest Port 1 View  Result Date: 10/25/2021 CLINICAL DATA:  Chest pain EXAM: PORTABLE CHEST 1 VIEW COMPARISON:  Chest x-ray dated July 22, 2008 FINDINGS: The heart size and mediastinal contours are within normal limits. Both lungs are clear. Dislocation of the left acromioclavicular joint, likely chronic. IMPRESSION: 1. No acute cardiopulmonary abnormality. 2. Dislocation of the left acromioclavicular joint, likely chronic. Recommend clinical  correlation. Electronically Signed   By: Allegra Lai M.D.   On: 10/25/2021 14:46    Procedures .Critical Care  Performed by: Dartha Lodge, PA-C Authorized by: Dartha Lodge, PA-C   Critical  care provider statement:    Critical care time (minutes):  45   Critical care time was exclusive of:  Separately billable procedures and treating other patients and teaching time   Critical care was necessary to treat or prevent imminent or life-threatening deterioration of the following conditions:  Circulatory failure (Saddle pulmonary embolus)   Critical care was time spent personally by me on the following activities:  Development of treatment plan with patient or surrogate, discussions with consultants, evaluation of patient's response to treatment, examination of patient, ordering and review of laboratory studies, ordering and review of radiographic studies, ordering and performing treatments and interventions, pulse oximetry, re-evaluation of patient's condition and review of old charts   Care discussed with: admitting provider       Medications Ordered in ED Medications  heparin ADULT infusion 100 units/mL (25000 units/223mL) (1,550 Units/hr Intravenous New Bag/Given 10/25/21 1521)  heparin bolus via infusion 6,500 Units (has no administration in time range)  lidocaine (XYLOCAINE) 1 % (with pres) injection (has no administration in time range)  docusate sodium (COLACE) capsule 100 mg (has no administration in time range)  polyethylene glycol (MIRALAX / GLYCOLAX) packet 17 g (has no administration in time range)  fentaNYL (SUBLIMAZE) 100 MCG/2ML injection (has no administration in time range)  midazolam (VERSED) 2 MG/2ML injection (has no administration in time range)  midazolam (VERSED) injection (2 mg Intravenous Given 10/25/21 1611)  fentaNYL (SUBLIMAZE) injection (50 mcg Intravenous Given 10/25/21 1611)  lactated ringers bolus 1,000 mL (1,000 mLs Intravenous New Bag/Given 10/25/21 1502)   iohexol (OMNIPAQUE) 350 MG/ML injection 100 mL (100 mLs Intravenous Contrast Given 10/25/21 1456)    ED Course/ Medical Decision Making/ A&P                           Medical Decision Making Amount and/or Complexity of Data Reviewed Labs: ordered. Radiology: ordered.  Risk Prescription drug management. Decision regarding hospitalization.   KINGSTYN DERUITER is a 51 y.o. male presents to the ED for concern of chest pain, shortness of breath and lightheadedness, this involves an extensive number of treatment options, and is a complaint that carries with it a high risk of complications and morbidity.  The differential diagnosis includes PE, ACS, aortic dissection, pneumothorax, pneumonia, flash pulmonary edema   Additional history obtained:  Additional history obtained from spouse at bedside External records from outside source obtained and reviewed including notes from recent surgical procedure   Lab Tests:  I Ordered, reviewed, and interpreted labs.  The pertinent results include: No leukocytosis and normal hemoglobin, no significant electrolyte derangements, normal renal and liver function.  Troponin, BNP and coags pending   Imaging Studies ordered:  I ordered imaging studies including CTA PE study, patient is ill-appearing and symptoms have developed quite rapidly with multiple derangements of vital signs I have extremely high suspicion for PE, patient had recent lab work done with normal creatinine on 7/18, I think patient is at high risk for decompensation and so decision was made to bypass lab work and go straight to CT give contrast to assess for potential pulmonary embolus I independently visualized and interpreted imaging which showed saddle PE with right heart strain I agree with the radiologist interpretation   Cardiac Monitoring:  The patient was maintained on a cardiac monitor.  I personally viewed and interpreted the cardiac monitored which showed an underlying  rhythm of: Sinus tachycardia with rates in the 120s-130s   Medicines ordered and prescription  drug management:  Patient was started on heparin bolus and infusion for saddle PE I have reviewed the patients home medicines and have made adjustments as needed   Critical Interventions:  Anticoagulation   ED Course:  Patient ill-appearing on arrival, new hypoxia and tachycardia associated with lightheadedness, chest pain or shortness of breath.  Patient is 4 days postop after back surgery and presentation is extremely concerning for pulmonary embolus.  Symptoms have developed rapidly over the last 2 hours, given patient's increasing oxygen requirement I feel he is at extremely high clinical risk for decompensation.  Decision was made to go straight to CT without waiting for lab work as patient has had normal creatinine on recent labs. I accompanied patient to CT and immediately reviewed imaging upon completion of study which was consistent with a saddle pulmonary embolus Patient remains tachycardic and hypoxic but is maintaining stable blood pressure at this time.  I discussed results of CT scan with patient and wife at bedside and consulted pulmonary critical care to assess for candidacy for potential thrombectomy versus catheter targeted lytics.   Consultations Obtained:  I requested consultation with pulmonary/critical care,  and discussed lab and imaging findings as well as pertinent plan -they are at bedside with patient, will plan to admit patient to ICU for close monitoring, given that patient is only 6 days postop after back surgery tPA is contraindicated but they will discuss with IR for potential thrombectomy. I also requested consultation with neurosurgery for their input regarding potential need for blood thinners versus lytics in the setting of saddle pulmonary embolus with recent surgery, they stated that patient is at low risk for bleeding, and that if needed lytics could be  given   Dispostion:  After consideration of the diagnostic results and the patients response to treatment feel that the patent would benefit from admission.          Final Clinical Impression(s) / ED Diagnoses Final diagnoses:  Acute saddle pulmonary embolism with acute cor pulmonale [facility])    Rx / DC Orders ED Discharge Orders     None         Dartha Lodge, [place] 10/25/21 1629    Milagros Loll, MD 10/26/21 [phone]

## 2021-10-25 NOTE — Sedation Documentation (Signed)
Bleeding controlled. Quik-clot applied. Please change dressing in 24 hours and remove quik-clot

## 2021-10-25 NOTE — Consult Note (Signed)
Chief Complaint: Patient was seen in consultation today for submassive PE Chief Complaint  Patient presents with   Dizziness   Chest Pain   at the request of PCCM  Referring Physician(s): PCCM  Supervising Physician: Malachy Moan  Patient Status: [facility] - ED  History of Present Illness: Sean Serrano is a 51 y.o. male with PMHs of HLD, DVT, back pain with radiculopathy s/p bilateral laminotomies and discectomy L4-L5 with decompression of the L5 nerve roots and common dural tube on 10/19/21 who was brought to [facility] ED by ambulance today due to syncopal episode with chest pressure.   At the time of the presentation, patient was found to be tachycardic and hypoxic on room air with O2 sat 88%. BNP and CBC were unremarkable, EKG was negatibe for STEMI. Patient underwent CTA chest which showed acute saddle PE with CT evidence of R heart strain consistent with at least submassive PE.   PCCM reached out to Dr. Archer Asa, it was determined that patient requires emergent image guided PE thrombectomy.   Patient seen in ED, he is laying in bed, NAD. PCCM providers, RN, and spouse at bedside.   Patient reports that he developed LE DVT after knee surgery due to dislocation, he is unsure when his knee surgery was. He was on "shots on my belly" for a while due to the DVT. He also report that he developed DVT in LE again, he was started on aspirin only for the second DVT.  Currently he is not in respiratory distress, he is on 2L of O2 via [place] but able to speak full sentence w/o difficulty. O2 sat in mid 90s, patient remains tachycardic.    Past Medical History:  Diagnosis Date   ED (erectile dysfunction)    Hyperlipidemia    PONV (postoperative nausea and vomiting)     Past Surgical History:  Procedure Laterality Date   ac separation     BACK SURGERY     LUMBAR LAMINECTOMY/DECOMPRESSION MICRODISCECTOMY Bilateral 10/19/2021   Procedure: Bilateral Lumbar four-five  Microdiscectomy;   Surgeon: Barnett Abu, MD;  Location: [facility] OR;  Service: Neurosurgery;  Laterality: Bilateral;   SHOULDER SURGERY      Allergies: Patient has no known allergies.  Medications: Prior to Admission medications   Medication Sig Start Date End Date Taking? Authorizing Provider  atorvastatin (LIPITOR) 10 MG tablet Take 1 tablet (10 mg total) by mouth daily. 12/11/20   Monica Becton, MD  calcium carbonate (TUMS EX) 750 MG chewable tablet Chew 750 mg by mouth daily as needed for heartburn.    [provider]  famotidine (PEPCID) 20 MG tablet Take 20 mg by mouth daily.    [provider]  HYDROcodone-acetaminophen (NORCO/VICODIN) 5-325 MG tablet Take 1-2 tablets by mouth every 4 (four) hours as needed (pain). 10/20/21   Jadene Pierini, MD  methocarbamol (ROBAXIN) 500 MG tablet Take 1 tablet (500 mg total) by mouth every 6 (six) hours as needed for muscle spasms. 10/20/21   Jadene Pierini, MD  naproxen (NAPROSYN) 500 MG tablet TAKE 1 TABLET BY MOUTH 2 TIMES DAILY WITH A MEAL. Patient taking differently: Take 500 mg by mouth 2 (two) times daily with a meal. 08/13/21   Monica Becton, MD  tadalafil (CIALIS) 5 MG tablet Take 1 tablet (5 mg total) by mouth daily. 12/11/20   Monica Becton, MD     Family History  Problem Relation Age of Onset   Healthy Mother     Social  History   Socioeconomic History   Marital status: Married    Spouse name: Not on file   Number of children: Not on file   Years of education: Not on file   Highest education level: Not on file  Occupational History   Not on file  Tobacco Use   Smoking status: Never   Smokeless tobacco: Never  Vaping Use   Vaping Use: Never used  Substance and Sexual Activity   Alcohol use: Yes    Alcohol/week: 12.0 standard drinks of alcohol    Types: 12 Cans of beer per week    Comment: weekly   Drug use: No   Sexual activity: Not on file  Other Topics Concern   Not on file  Social  History Narrative   Not on file   Social Determinants of Health   Financial Resource Strain: Not on file  Food Insecurity: Not on file  Transportation Needs: Not on file  Physical Activity: Not on file  Stress: Not on file  Social Connections: Not on file     Review of Systems: A 12 point ROS discussed and pertinent positives are indicated in the HPI above.  All other systems are negative.  Vital Signs: BP 119/84   Pulse (!) 120   Temp (!) 97.4 F (36.3 C) (Oral)   Resp 19   Ht  (1.753 m)   Wt 189 lb 2.5 oz (85.8 kg)   SpO2 91%   BMI 27.93 kg/m    Physical Exam Vitals reviewed.  Constitutional:      General: He is not in acute distress.    Appearance: He is well-developed. He is not ill-appearing.  HENT:     Head: Normocephalic.  Cardiovascular:     Rate and Rhythm: Regular rhythm. Tachycardia present.     Heart sounds: Normal heart sounds.  Pulmonary:     Effort: Pulmonary effort is normal. No tachypnea, accessory muscle usage or respiratory distress.     Breath sounds: Normal breath sounds.  Abdominal:     General: Bowel sounds are normal.     Palpations: Abdomen is soft.  Musculoskeletal:     Right lower leg: No edema.     Left lower leg: No edema.  Skin:    General: Skin is warm and dry.     Coloration: Skin is not cyanotic or pale.  Neurological:     General: No focal deficit present.     Mental Status: He is alert and oriented to person, place, and time.  Psychiatric:        Mood and Affect: Mood normal.        Behavior: Behavior normal.     MD Evaluation Airway: WNL Heart: WNL Abdomen: WNL Chest/ Lungs: WNL ASA  Classification: 3 Mallampati/Airway Score: One  Imaging: CT Angio Chest PE W and/or Wo Contrast  Result Date: 10/25/2021 CLINICAL DATA:  Pulmonary embolism (PE) suspected, high prob EXAM: CT ANGIOGRAPHY CHEST WITH CONTRAST TECHNIQUE: Multidetector CT imaging of the chest was performed using the standard protocol during bolus  administration of intravenous contrast. Multiplanar CT image reconstructions and MIPs were obtained to evaluate the vascular anatomy. RADIATION DOSE REDUCTION: This exam was performed according to the departmental dose-optimization program which includes automated exposure control, adjustment of the mA and/or kV according to patient size and/or use of iterative reconstruction technique. CONTRAST:  OMNIPAQUE IOHEXOL 350 MG/ML SOLN COMPARISON:  Chest radiograph from the same day. FINDINGS: Cardiovascular: Positive for acute PE extending into the  left and right pulmonary arteries (saddle pulmonary embolism) as well as more distal lobar, segmental and subsegmental pulmonary arteries bilaterally. Evidence of right heart strain (RV/LV Ratio = 1.62). No pericardial effusion. Borderline cardiomegaly. Mediastinum/Nodes: No enlarged mediastinal, hilar, or axillary lymph nodes. Thyroid gland, trachea, and esophagus demonstrate no significant findings. Lungs/Pleura: Lungs are clear. No pleural effusion or pneumothorax. Upper Abdomen: No acute abnormality. Musculoskeletal: No chest wall abnormality. No acute or significant osseous findings. Review of the MIP images confirms the above findings. IMPRESSION: Positive for acute saddle pulmonary embolism (detailed above) with CT evidence of right heart strain (RV/LV Ratio = 1.62) consistent with at least submassive (intermediate risk) PE. The presence of right heart strain has been associated with an increased risk of morbidity and mortality. Please refer to the "Code PE Focused" order set in EPIC. Findings discussed with Dr. Buck Mam via telephone at 3:05 p.m. Electronically Signed   By: Feliberto Harts M.D.   On: 10/25/2021 15:06   DG Chest Port 1 View  Result Date: 10/25/2021 CLINICAL DATA:  Chest pain EXAM: PORTABLE CHEST 1 VIEW COMPARISON:  Chest x-ray dated July 22, 2008 FINDINGS: The heart size and mediastinal contours are within normal limits. Both lungs are  clear. Dislocation of the left acromioclavicular joint, likely chronic. IMPRESSION: 1. No acute cardiopulmonary abnormality. 2. Dislocation of the left acromioclavicular joint, likely chronic. Recommend clinical correlation. Electronically Signed   By: Allegra Lai M.D.   On: 10/25/2021 14:46   DG Lumbar Spine 2-3 Views  Result Date: 10/19/2021 CLINICAL DATA:  L4-L5 microdiscectomy EXAM: LUMBAR SPINE - 2-3 VIEW COMPARISON:  09/29/2021 FINDINGS: Intraoperative imaging documenting instrumentation posterior to the L4-L5 disc space. IMPRESSION: Localization of the L4-L5 disc space. Electronically Signed   By: Wiliam Ke M.D.   On: 10/19/2021 18:51   MR Lumbar Spine Wo Contrast  Result Date: 10/06/2021 CLINICAL DATA:  Low back pain with cauda equina syndrome suspected. Recent injection without relief EXAM: MRI LUMBAR SPINE WITHOUT CONTRAST TECHNIQUE: Multiplanar, multisequence MR imaging of the lumbar spine was performed. No intravenous contrast was administered. COMPARISON:  None Available. FINDINGS: Segmentation:  5 lumbar type vertebrae Alignment:  Slight anterolisthesis at T12-L1 Vertebrae: No acute fracture, evidence of discitis, or bone lesion. Remote compression fracture at L1. Conus medullaris and cauda equina: Conus extends to the L1 level. Conus and cauda equina appear normal. Paraspinal and other soft tissues: Negative Disc levels: T12- L1: Mild facet spurring and disc narrowing L1-L2: Disc narrowing and bulging with endplate ridging. No neural impingement. L2-L3: Unremarkable. L3-L4: Unremarkable. L4-L5: Disc narrowing and bulging with central extrusion. There may be an epidural component of hemorrhage or fragmentation. Facet spurring and ligamentum flavum thickening. Advanced spinal stenosis. Degenerative facet spurring asymmetric to the left with left foraminal impingement L5-S1: Moderate degenerative facet spurring asymmetric to the left. IMPRESSION: 1. L4-5 severe spinal stenosis due to  degeneration and herniation. Focal ventral epidural material at this level could be associated blood products or fragmented disc. Moderate left foraminal narrowing. 2. Remote L1 compression fracture. Electronically Signed   By: Tiburcio Pea M.D.   On: 10/06/2021 12:58   DG Lumbar Spine Complete  Result Date: 09/29/2021 CLINICAL DATA:  Lower back pain radiating into the lower extremities. Patient reports a remote back fracture at age 36. EXAM: LUMBAR SPINE - COMPLETE 4+ VIEW COMPARISON:  None Available. FINDINGS: Wedge-shaped fracture of L1, which appears chronic. No other fractures. No spondylolisthesis. Mild loss of disc height at L4-L5. Remaining lumbar disc spaces are well  preserved. Minor anterior endplate osteophytes. Facet joint degenerative change on the left at L4-L5 and L5-S1. Soft tissues are unremarkable. IMPRESSION: 1. Wedge-shaped compression fracture of L1 that appears chronic and is likely the remote injury patient describes in the history. 2. No other fractures. 3. Mild disc degenerative changes at L4-L5 with left lower lumbar spine facet degenerative change. Electronically Signed   By: Amie Portland M.D.   On: 09/29/2021 10:11    Labs:  CBC: Recent Labs    12/13/20 0000 10/16/21 1053 10/25/21 1416  WBC 6.2 7.0 10.2  HGB 14.9 15.1 14.1  HCT 42.8 43.4 41.2  PLT 206 189 153    COAGS: No results for input(s): "INR", "APTT" in the last 8760 hours.  BMP: Recent Labs    12/13/20 0000 10/16/21 1053  NA 140 138  K 4.3 4.5  CL 107 109  CO2 26 24  GLUCOSE 93 90  BUN 21 20  CALCIUM 8.8 8.8*  CREATININE 1.00 1.03  GFRNONAA  --  >60    LIVER FUNCTION TESTS: Recent Labs    12/13/20 0000  BILITOT 0.5  AST 16  ALT 23  PROT 6.4    TUMOR MARKERS: No results for input(s): "AFPTM", "CEA", "CA199", "CHROMGRNA" in the last 8760 hours.  Assessment and Plan: 51 y.o. male with recent back surgery on 7/21 who was brought to [facility] ED today due to syncopal episode and chest  pressure, found to have a saddle submassive PE.   PCCM discussed the case with Dr. Archer Asa, patient requires PE thrombectomy.  NPO since 10 am today  VS tachycardic CBC stable   Risks and benefits of PE thrombectomy discussed with the patient and his spouse including, but not limited to bleeding, pulmonary hemorrhage, perforation or dissection of cardiovascular structures, vascular injury, stroke, contrast induced renal failure, and infection.  All of the questions were answered and there is agreement to proceed.  Consent signed and in chart.   Thank you for this interesting consult.  I greatly enjoyed meeting XADEN KAUFMAN and look forward to participating in their care.  A copy of this report was sent to the requesting provider on this date.  Electronically Signed: Willette Brace, PA-C 10/25/2021, 3:25 PM   I spent a total of 20 Minutes    in face to face in clinical consultation, greater than 50% of which was counseling/coordinating care for saddle, submassive PE.   This chart was dictated using voice recognition software.  Despite best efforts to proofread,  errors can occur which can change the documentation meaning.

## 2021-10-25 NOTE — Sedation Documentation (Signed)
ACT 149

## 2021-10-25 NOTE — Sedation Documentation (Signed)
ACT 185

## 2021-10-25 NOTE — Sedation Documentation (Signed)
Patient transported to [facility] ICU. Louis RN at the bedside to receive patient. Groin site assessed. No further bleeding noted at this time. Patient moved to ICU bed and placed on cardiac monitor. Wife Vernona Rieger at the bedside with patient.

## 2021-10-25 NOTE — Sedation Documentation (Signed)
Bloody drainage at dressing site. Changing dressing and applying quik clot

## 2021-10-26 ENCOUNTER — Telehealth (HOSPITAL_COMMUNITY): Payer: Self-pay | Admitting: Pharmacy Technician

## 2021-10-26 ENCOUNTER — Inpatient Hospital Stay (HOSPITAL_COMMUNITY): Payer: BC Managed Care – PPO

## 2021-10-26 ENCOUNTER — Other Ambulatory Visit (HOSPITAL_COMMUNITY): Payer: Self-pay

## 2021-10-26 DIAGNOSIS — I2602 Saddle embolus of pulmonary artery with acute cor pulmonale: Secondary | ICD-10-CM

## 2021-10-26 DIAGNOSIS — I2693 Single subsegmental pulmonary embolism without acute cor pulmonale: Secondary | ICD-10-CM

## 2021-10-26 DIAGNOSIS — M79605 Pain in left leg: Secondary | ICD-10-CM | POA: Diagnosis not present

## 2021-10-26 DIAGNOSIS — M79604 Pain in right leg: Secondary | ICD-10-CM

## 2021-10-26 LAB — BASIC METABOLIC PANEL
Anion gap: 6 (ref 5–15)
BUN: 19 mg/dL (ref 6–20)
CO2: 22 mmol/L (ref 22–32)
Calcium: 7.8 mg/dL — ABNORMAL LOW (ref 8.9–10.3)
Chloride: 111 mmol/L (ref 98–111)
Creatinine, Ser: 1.02 mg/dL (ref 0.61–1.24)
GFR, Estimated: >60 mL/min (ref >60)
Glucose, Bld: 139 mg/dL — ABNORMAL HIGH (ref 70–99)
Potassium: 3.7 mmol/L (ref 3.5–5.1)
Sodium: 139 mmol/L (ref 135–145)

## 2021-10-26 LAB — ECHOCARDIOGRAM COMPLETE
Area-P 1/2: 4.49 cm2
Height: 69 in
S' Lateral: 3 cm
Weight: 3026.47 oz

## 2021-10-26 LAB — CBC
HCT: 33.2 % — ABNORMAL LOW (ref 39.0–52.0)
Hemoglobin: 11.7 g/dL — ABNORMAL LOW (ref 13.0–17.0)
MCH: 32.5 pg (ref 26.0–34.0)
MCHC: 35.2 g/dL (ref 30.0–36.0)
MCV: 92.2 fL (ref 80.0–100.0)
Platelets: 144 10*3/uL — ABNORMAL LOW (ref 150–400)
RBC: 3.6 MIL/uL — ABNORMAL LOW (ref 4.22–5.81)
RDW: 12.4 % (ref 11.5–15.5)
WBC: 8.4 10*3/uL (ref 4.0–10.5)
nRBC: 0 % (ref 0.0–0.2)

## 2021-10-26 LAB — GLUCOSE, CAPILLARY
Glucose-Capillary: 111 mg/dL — ABNORMAL HIGH (ref 70–99)
Glucose-Capillary: 111 mg/dL — ABNORMAL HIGH (ref 70–99)
Glucose-Capillary: 101 mg/dL — ABNORMAL HIGH (ref 70–99)
Glucose-Capillary: 98 mg/dL (ref 70–99)

## 2021-10-26 LAB — HEPARIN LEVEL (UNFRACTIONATED)
Heparin Unfractionated: 0.6 IU/mL (ref 0.30–0.70)
Heparin Unfractionated: 0.44 IU/mL (ref 0.30–0.70)

## 2021-10-26 MED ORDER — FAMOTIDINE 40 MG/5ML PO SUSR
20.0000 mg | Freq: Once | ORAL | Status: AC
Start: 2021-10-26 — End: 2021-10-26
  Administered 2021-10-26: 20 mg via ORAL
  Filled 2021-10-26: qty 2.5

## 2021-10-26 MED ORDER — FAMOTIDINE 40 MG/5ML PO SUSR
20.0000 mg | Freq: Every day | ORAL | Status: DC
  Administered 2021-10-26: 20 mg via ORAL
  Filled 2021-10-26 (×2): qty 2.5

## 2021-10-26 MED ORDER — CHLORHEXIDINE GLUCONATE CLOTH 2 % EX PADS
6.0000 | MEDICATED_PAD | Freq: Every day | CUTANEOUS | Status: DC
Start: 2021-10-26 — End: 2021-10-27
  Administered 2021-10-26: 6 via TOPICAL

## 2021-10-26 MED ORDER — FAMOTIDINE 40 MG/5ML PO SUSR: 20.0000 mg | ORAL | Status: DC

## 2021-10-26 MED ORDER — ACETAMINOPHEN 325 MG PO TABS
650.0000 mg | ORAL_TABLET | Freq: Four times a day (QID) | ORAL | Status: DC | PRN
  Administered 2021-10-26 – 2021-10-28 (×2): 650 mg via ORAL
  Filled 2021-10-26 (×2): qty 2

## 2021-10-26 NOTE — Progress Notes (Signed)
  Echocardiogram 2D Echocardiogram has been performed.  Sean Serrano 10/26/2021, 11:41 AM

## 2021-10-26 NOTE — Consult Note (Signed)
   Providing Compassionate, Quality Care - Together  Neurosurgery Consult  Referring physician: Dr. Francine Graven Reason for referral: recent lumbar laminectomy  Chief Complaint: DIB  History of Present Illness: This is a 51 yo M with h/o provoked DVTs and a recent L4-5 open lami /discectomy for severe stenosis and cauda equina by Dr. Danielle Dess 10/19/21, was doing well and began having shortness of breath and syncope. Also has CP, was found to have saddle PE s. At this time his radicular symptoms and RLE weakness is improving. Denies bowel or bladder issues. Denies wound issues. Denies fevers.   Medications: I have reviewed the patient's current medications. Allergies: No Known Allergies  History reviewed. No pertinent family history. Social History:  has no history on file for tobacco use, alcohol use, and drug use.  ROS: All +/- in HPI  Physical Exam:  Vital signs in last 24 hours: Temp:  [98 F (36.7 C)-98.3 F (36.8 C)] 98 F (36.7 C) (07/25 1814) Pulse Rate:  [58-128] 65 (07/26 0746) Resp:  [11-18] 14 (07/26 0217) BP: (138-182)/(65-125) 153/88 (07/26 0700) SpO2:  [91 %-98 %] 96 % (07/26 0746) PE: AAOx3 PERRL CN 2-12 intact BUE 5/5 BLE 5/5 except R DF 4+/5 Incision healing well, no erythema, drainage or fluctuance   Impression/Assessment:  51 yo M with:  PEs H/o L4-5 HNP, s/p lami/discectomy  Plan:  -agree and appreciate care from IR/CCM for treatment of PE with thrombectomy and now on IV heparin -PT/OT once cleared for ambulation -will follow peripherally   Thank you for allowing me to participate in this patient's care.  Please do not hesitate to call with questions or concerns.   Monia Pouch, DO Neurosurgeon [facility] Cell: [phone]

## 2021-10-26 NOTE — Progress Notes (Signed)
Referring Physician(s): PCCM  Supervising Physician: Gilmer Mor  Patient Status:  [facility] - In-pt  Chief Complaint:  S/p PE thrombectomy 10/25/21 with Dr Archer Asa  Subjective:  Patient alert in bed with wife, Sean Serrano, in room at time of exam. States that he is feeling much better today than he was feeling. He reports mild chest discomfort overnight, but that this resolved after he asked his nurse for an antacid. This morning the patient and his wife state that they have no current questions or concerns.   Allergies: Patient has no known allergies.  Medications: Prior to Admission medications   Medication Sig Start Date End Date Taking? Authorizing Provider  atorvastatin (LIPITOR) 10 MG tablet Take 1 tablet (10 mg total) by mouth daily. 12/11/20   Monica Becton, MD  calcium carbonate (TUMS EX) 750 MG chewable tablet Chew 750 mg by mouth daily as needed for heartburn.    [provider]  famotidine (PEPCID) 20 MG tablet Take 20 mg by mouth daily.    [provider]  HYDROcodone-acetaminophen (NORCO/VICODIN) 5-325 MG tablet Take 1-2 tablets by mouth every 4 (four) hours as needed (pain). 10/20/21   Jadene Pierini, MD  methocarbamol (ROBAXIN) 500 MG tablet Take 1 tablet (500 mg total) by mouth every 6 (six) hours as needed for muscle spasms. 10/20/21   Jadene Pierini, MD  naproxen (NAPROSYN) 500 MG tablet TAKE 1 TABLET BY MOUTH 2 TIMES DAILY WITH A MEAL. Patient taking differently: Take 500 mg by mouth 2 (two) times daily with a meal. 08/13/21   Monica Becton, MD  tadalafil (CIALIS) 5 MG tablet Take 1 tablet (5 mg total) by mouth daily. 12/11/20   Monica Becton, MD     Vital Signs: BP (!) 141/94 (BP Location: Left Arm)   Pulse 100   Temp (!) 97.5 F (36.4 C) (Axillary)   Resp 18   Ht 5\' 9"  (1.753 m)   Wt 189 lb 2.5 oz (85.8 kg)   SpO2 93%   BMI 27.93 kg/m   Physical Exam Vitals reviewed.  Constitutional:      General: He  is not in acute distress.    Appearance: He is not ill-appearing.  HENT:     Head: Normocephalic.  Cardiovascular:     Rate and Rhythm: Normal rate and regular rhythm.     Pulses: Normal pulses.     Heart sounds: Normal heart sounds.     Comments: Heart rate in mid-90s when examined in rooms Pulmonary:     Effort: Pulmonary effort is normal. No respiratory distress.     Breath sounds: Normal breath sounds.     Comments: Pt has O2 saturation in mid-90s during exam Musculoskeletal:     Comments: Right groin puncture site is soft, non-pulsatile, and non-tender. Dressing is clean with no sign of bleeding.   Skin:    General: Skin is warm and dry.  Neurological:     Mental Status: He is alert and oriented to person, place, and time.  Psychiatric:        Mood and Affect: Mood normal.        Behavior: Behavior normal.        Thought Content: Thought content normal.        Judgment: Judgment normal.     Imaging: IR US Guide Vasc Access Right  Result Date: 10/25/2021 INDICATION: 52 year old male with acute intermediate-high risk pulmonary emboli. He is not a candidate for systemic or catheter directed thrombolysis  due to recent decompressive lumbar spine surgery. Therefore, he presents for urgent mechanical thrombectomy. EXAM: IR ULTRASOUND GUIDANCE VASC ACCESS RIGHT; ADDITIONAL ARTERIOGRAPHY; THROMBECTOMY MECHANICAL; BILATERAL PULMONARY ARTERIOGRAPHY COMPARISON:  CTA obtained earlier today MEDICATIONS: Seven thousand units heparin ANESTHESIA/SEDATION: Versed 2 mg IV; Fentanyl 100 mcg IV Moderate Sedation Time:  91 minutes The patient was continuously monitored during the procedure by the interventional radiology nurse under my direct supervision. FLUOROSCOPY TIME:  Radiation exposure index: 322 mGy COMPLICATIONS: None immediate. TECHNIQUE: Informed written consent was obtained from the patient after a thorough discussion of the procedural risks, benefits and alternatives. All questions were  addressed. Maximal Sterile Barrier Technique was utilized including caps, mask, sterile gowns, sterile gloves, sterile drape, hand hygiene and skin antiseptic. A timeout was performed prior to the initiation of the procedure. The right common femoral vein was interrogated with ultrasound and found to be widely patent. An image was obtained and stored for the medical record. Local anesthesia was attained by infiltration with 1% lidocaine. A small dermatotomy was made. Under real-time sonographic guidance, the vessel was punctured with a 21 gauge micropuncture needle. Using standard technique, the initial micro needle was exchanged over a 0.018 micro wire for a transitional 4 [place] micro sheath. The micro sheath was then exchanged over a 0.035 wire for a fascial dilator and the percutaneous tract was dilated to 16 [place]. The hydrophilic 24 [place] Inari sheath was then carefully advanced over the wire and into the inferior vena cava. A 6 French angled pigtail catheter was advanced through the right heart and into the main pulmonary artery. An initial pulmonary arteriogram was performed. There is large volume thrombus with significant clot burden in the left main and left lower pulmonary artery, a thin saddle embolus spanning the pulmonary artery bifurcation and a large thrombus at the confluence of the right truncus anterior and right lower lobe pulmonary arteries. Significantly decreased parenchymal perfusion. Pulmonary arterial pressures were measured. The mean main pulmonary arterial pressure was 38 mm Hg consistent with pulmonary arterial hypertension. The pigtail catheter was removed over a superstiff Amplatz wire. A 100 cm angled vertebral catheter was introduced over the wire and carefully navigated into the left lower lobe pulmonary artery. The superstiff Amplatz wire was again placed. The 24 [place] FlowTriever device was then advanced over the wire and into the left main pulmonary artery. The curved 20  French aspiration catheter was advanced coaxially deeper into the left lower lobe pulmonary artery. Suction thrombectomy was then performed. Multiple passes with both the 20 [place] and 24 [place] aspiration catheters were performed. Positive extirpation of extensive acute thrombus. Aspirated blood was returned to the circulation using the flow Saver device. Follow-up left pulmonary arteriography demonstrates near complete resolution of thrombus with significant improvement in arterial perfusion to the entirety of the left pulmonary arterial tree. Therefore, the aspiration catheter was brought back into the main pulmonary artery and the vert catheter carefully advanced into the right main pulmonary artery. Right pulmonary arteriography was performed. Significant thrombus burden in the right main pulmonary artery extending into the truncus anterior and right lower lobe pulmonary arteries. The superstiff Amplatz wire was successfully advanced into the right lower lobe pulmonary artery. The 24 French aspiration catheter was advanced into the vessel. Suction thrombectomy was then performed multiple times. Successful extirpation of extensive acute thrombus. Follow-up venography demonstrates near complete resolution of thrombus from the right lower lobe pulmonary artery. Small thrombi are present within segmental branches. There is some persistent thrombus in the truncus  anterior. Therefore, the catheter was repositioned and additional thrombectomy was performed. At 1 point during the final passes, thrombus was adherent to the aspiration catheter and was brought back through the heart into the hepatic IVC. Additional aspiration was performed. At this time, the patient experienced some acute pain and had a vasovagal episode. This was self limited and resolved relatively quickly. Contrast injection through the 24 French sheath demonstrates no evidence of complication or significant residual thrombus adherent to the 24 French  aspiration catheter. Therefore, the aspiration catheter was removed without complication. Final pressures were not obtained due to the vasovagal episode. FINDINGS: 1. Mean main pulmonary arterial pressure 38 mm Hg consistent with acute pulmonary arterial hypertension. 2. Follow-up pressures were not obtained following thrombectomy due to a vasovagal episode. IMPRESSION: 1. Large volume bilateral acute PE including saddle embolus with acute pulmonary arterial hypertension and right heart strain. 2. Successful bilateral pulmonary arterial thrombectomy with extirpation of extensive acute thrombus. The patient's oxygenation and tachycardia improved immediately following successful thrombectomy. Electronically Signed   By: Malachy Moan M.D.   On: 10/25/2021 18:37   IR Angiogram Pulmonary Bilateral Selective  Result Date: 10/25/2021 INDICATION: 51 year old male with acute intermediate-high risk pulmonary emboli. He is not a candidate for systemic or catheter directed thrombolysis due to recent decompressive lumbar spine surgery. Therefore, he presents for urgent mechanical thrombectomy. EXAM: IR ULTRASOUND GUIDANCE VASC ACCESS RIGHT; ADDITIONAL ARTERIOGRAPHY; THROMBECTOMY MECHANICAL; BILATERAL PULMONARY ARTERIOGRAPHY COMPARISON:  CTA obtained earlier today MEDICATIONS: Seven thousand units heparin ANESTHESIA/SEDATION: Versed 2 mg IV; Fentanyl 100 mcg IV Moderate Sedation Time:  91 minutes The patient was continuously monitored during the procedure by the interventional radiology nurse under my direct supervision. FLUOROSCOPY TIME:  Radiation exposure index: 322 mGy COMPLICATIONS: None immediate. TECHNIQUE: Informed written consent was obtained from the patient after a thorough discussion of the procedural risks, benefits and alternatives. All questions were addressed. Maximal Sterile Barrier Technique was utilized including caps, mask, sterile gowns, sterile gloves, sterile drape, hand hygiene and skin  antiseptic. A timeout was performed prior to the initiation of the procedure. The right common femoral vein was interrogated with ultrasound and found to be widely patent. An image was obtained and stored for the medical record. Local anesthesia was attained by infiltration with 1% lidocaine. A small dermatotomy was made. Under real-time sonographic guidance, the vessel was punctured with a 21 gauge micropuncture needle. Using standard technique, the initial micro needle was exchanged over a 0.018 micro wire for a transitional 4 [place] micro sheath. The micro sheath was then exchanged over a 0.035 wire for a fascial dilator and the percutaneous tract was dilated to 16 [place]. The hydrophilic 24 [place] Inari sheath was then carefully advanced over the wire and into the inferior vena cava. A 6 French angled pigtail catheter was advanced through the right heart and into the main pulmonary artery. An initial pulmonary arteriogram was performed. There is large volume thrombus with significant clot burden in the left main and left lower pulmonary artery, a thin saddle embolus spanning the pulmonary artery bifurcation and a large thrombus at the confluence of the right truncus anterior and right lower lobe pulmonary arteries. Significantly decreased parenchymal perfusion. Pulmonary arterial pressures were measured. The mean main pulmonary arterial pressure was 38 mm Hg consistent with pulmonary arterial hypertension. The pigtail catheter was removed over a superstiff Amplatz wire. A 100 cm angled vertebral catheter was introduced over the wire and carefully navigated into the left lower lobe pulmonary artery. The superstiff Amplatz  wire was again placed. The 24 [place] FlowTriever device was then advanced over the wire and into the left main pulmonary artery. The curved 20 French aspiration catheter was advanced coaxially deeper into the left lower lobe pulmonary artery. Suction thrombectomy was then performed.  Multiple passes with both the 20 [place] and 24 [place] aspiration catheters were performed. Positive extirpation of extensive acute thrombus. Aspirated blood was returned to the circulation using the flow Saver device. Follow-up left pulmonary arteriography demonstrates near complete resolution of thrombus with significant improvement in arterial perfusion to the entirety of the left pulmonary arterial tree. Therefore, the aspiration catheter was brought back into the main pulmonary artery and the vert catheter carefully advanced into the right main pulmonary artery. Right pulmonary arteriography was performed. Significant thrombus burden in the right main pulmonary artery extending into the truncus anterior and right lower lobe pulmonary arteries. The superstiff Amplatz wire was successfully advanced into the right lower lobe pulmonary artery. The 24 French aspiration catheter was advanced into the vessel. Suction thrombectomy was then performed multiple times. Successful extirpation of extensive acute thrombus. Follow-up venography demonstrates near complete resolution of thrombus from the right lower lobe pulmonary artery. Small thrombi are present within segmental branches. There is some persistent thrombus in the truncus anterior. Therefore, the catheter was repositioned and additional thrombectomy was performed. At 1 point during the final passes, thrombus was adherent to the aspiration catheter and was brought back through the heart into the hepatic IVC. Additional aspiration was performed. At this time, the patient experienced some acute pain and had a vasovagal episode. This was self limited and resolved relatively quickly. Contrast injection through the 24 French sheath demonstrates no evidence of complication or significant residual thrombus adherent to the 24 French aspiration catheter. Therefore, the aspiration catheter was removed without complication. Final pressures were not obtained due to the  vasovagal episode. FINDINGS: 1. Mean main pulmonary arterial pressure 38 mm Hg consistent with acute pulmonary arterial hypertension. 2. Follow-up pressures were not obtained following thrombectomy due to a vasovagal episode. IMPRESSION: 1. Large volume bilateral acute PE including saddle embolus with acute pulmonary arterial hypertension and right heart strain. 2. Successful bilateral pulmonary arterial thrombectomy with extirpation of extensive acute thrombus. The patient's oxygenation and tachycardia improved immediately following successful thrombectomy. Electronically Signed   By: Malachy Moan M.D.   On: 10/25/2021 18:37   IR Angiogram Selective Each Additional Vessel  Result Date: 10/25/2021 INDICATION: 51 year old male with acute intermediate-high risk pulmonary emboli. He is not a candidate for systemic or catheter directed thrombolysis due to recent decompressive lumbar spine surgery. Therefore, he presents for urgent mechanical thrombectomy. EXAM: IR ULTRASOUND GUIDANCE VASC ACCESS RIGHT; ADDITIONAL ARTERIOGRAPHY; THROMBECTOMY MECHANICAL; BILATERAL PULMONARY ARTERIOGRAPHY COMPARISON:  CTA obtained earlier today MEDICATIONS: Seven thousand units heparin ANESTHESIA/SEDATION: Versed 2 mg IV; Fentanyl 100 mcg IV Moderate Sedation Time:  91 minutes The patient was continuously monitored during the procedure by the interventional radiology nurse under my direct supervision. FLUOROSCOPY TIME:  Radiation exposure index: 322 mGy COMPLICATIONS: None immediate. TECHNIQUE: Informed written consent was obtained from the patient after a thorough discussion of the procedural risks, benefits and alternatives. All questions were addressed. Maximal Sterile Barrier Technique was utilized including caps, mask, sterile gowns, sterile gloves, sterile drape, hand hygiene and skin antiseptic. A timeout was performed prior to the initiation of the procedure. The right common femoral vein was interrogated with ultrasound  and found to be widely patent. An image was obtained and stored for the  medical record. Local anesthesia was attained by infiltration with 1% lidocaine. A small dermatotomy was made. Under real-time sonographic guidance, the vessel was punctured with a 21 gauge micropuncture needle. Using standard technique, the initial micro needle was exchanged over a 0.018 micro wire for a transitional 4 [place] micro sheath. The micro sheath was then exchanged over a 0.035 wire for a fascial dilator and the percutaneous tract was dilated to 16 [place]. The hydrophilic 24 [place] Inari sheath was then carefully advanced over the wire and into the inferior vena cava. A 6 French angled pigtail catheter was advanced through the right heart and into the main pulmonary artery. An initial pulmonary arteriogram was performed. There is large volume thrombus with significant clot burden in the left main and left lower pulmonary artery, a thin saddle embolus spanning the pulmonary artery bifurcation and a large thrombus at the confluence of the right truncus anterior and right lower lobe pulmonary arteries. Significantly decreased parenchymal perfusion. Pulmonary arterial pressures were measured. The mean main pulmonary arterial pressure was 38 mm Hg consistent with pulmonary arterial hypertension. The pigtail catheter was removed over a superstiff Amplatz wire. A 100 cm angled vertebral catheter was introduced over the wire and carefully navigated into the left lower lobe pulmonary artery. The superstiff Amplatz wire was again placed. The 24 [place] FlowTriever device was then advanced over the wire and into the left main pulmonary artery. The curved 20 French aspiration catheter was advanced coaxially deeper into the left lower lobe pulmonary artery. Suction thrombectomy was then performed. Multiple passes with both the 20 [place] and 24 [place] aspiration catheters were performed. Positive extirpation of extensive acute thrombus.  Aspirated blood was returned to the circulation using the flow Saver device. Follow-up left pulmonary arteriography demonstrates near complete resolution of thrombus with significant improvement in arterial perfusion to the entirety of the left pulmonary arterial tree. Therefore, the aspiration catheter was brought back into the main pulmonary artery and the vert catheter carefully advanced into the right main pulmonary artery. Right pulmonary arteriography was performed. Significant thrombus burden in the right main pulmonary artery extending into the truncus anterior and right lower lobe pulmonary arteries. The superstiff Amplatz wire was successfully advanced into the right lower lobe pulmonary artery. The 24 French aspiration catheter was advanced into the vessel. Suction thrombectomy was then performed multiple times. Successful extirpation of extensive acute thrombus. Follow-up venography demonstrates near complete resolution of thrombus from the right lower lobe pulmonary artery. Small thrombi are present within segmental branches. There is some persistent thrombus in the truncus anterior. Therefore, the catheter was repositioned and additional thrombectomy was performed. At 1 point during the final passes, thrombus was adherent to the aspiration catheter and was brought back through the heart into the hepatic IVC. Additional aspiration was performed. At this time, the patient experienced some acute pain and had a vasovagal episode. This was self limited and resolved relatively quickly. Contrast injection through the 24 French sheath demonstrates no evidence of complication or significant residual thrombus adherent to the 24 French aspiration catheter. Therefore, the aspiration catheter was removed without complication. Final pressures were not obtained due to the vasovagal episode. FINDINGS: 1. Mean main pulmonary arterial pressure 38 mm Hg consistent with acute pulmonary arterial hypertension. 2. Follow-up  pressures were not obtained following thrombectomy due to a vasovagal episode. IMPRESSION: 1. Large volume bilateral acute PE including saddle embolus with acute pulmonary arterial hypertension and right heart strain. 2. Successful bilateral pulmonary arterial thrombectomy with extirpation of  extensive acute thrombus. The patient's oxygenation and tachycardia improved immediately following successful thrombectomy. Electronically Signed   By: Malachy Moan M.D.   On: 10/25/2021 18:37   IR Angiogram Selective Each Additional Vessel  Result Date: 10/25/2021 INDICATION: 51 year old male with acute intermediate-high risk pulmonary emboli. He is not a candidate for systemic or catheter directed thrombolysis due to recent decompressive lumbar spine surgery. Therefore, he presents for urgent mechanical thrombectomy. EXAM: IR ULTRASOUND GUIDANCE VASC ACCESS RIGHT; ADDITIONAL ARTERIOGRAPHY; THROMBECTOMY MECHANICAL; BILATERAL PULMONARY ARTERIOGRAPHY COMPARISON:  CTA obtained earlier today MEDICATIONS: Seven thousand units heparin ANESTHESIA/SEDATION: Versed 2 mg IV; Fentanyl 100 mcg IV Moderate Sedation Time:  91 minutes The patient was continuously monitored during the procedure by the interventional radiology nurse under my direct supervision. FLUOROSCOPY TIME:  Radiation exposure index: 322 mGy COMPLICATIONS: None immediate. TECHNIQUE: Informed written consent was obtained from the patient after a thorough discussion of the procedural risks, benefits and alternatives. All questions were addressed. Maximal Sterile Barrier Technique was utilized including caps, mask, sterile gowns, sterile gloves, sterile drape, hand hygiene and skin antiseptic. A timeout was performed prior to the initiation of the procedure. The right common femoral vein was interrogated with ultrasound and found to be widely patent. An image was obtained and stored for the medical record. Local anesthesia was attained by infiltration with 1%  lidocaine. A small dermatotomy was made. Under real-time sonographic guidance, the vessel was punctured with a 21 gauge micropuncture needle. Using standard technique, the initial micro needle was exchanged over a 0.018 micro wire for a transitional 4 [place] micro sheath. The micro sheath was then exchanged over a 0.035 wire for a fascial dilator and the percutaneous tract was dilated to 16 [place]. The hydrophilic 24 [place] Inari sheath was then carefully advanced over the wire and into the inferior vena cava. A 6 French angled pigtail catheter was advanced through the right heart and into the main pulmonary artery. An initial pulmonary arteriogram was performed. There is large volume thrombus with significant clot burden in the left main and left lower pulmonary artery, a thin saddle embolus spanning the pulmonary artery bifurcation and a large thrombus at the confluence of the right truncus anterior and right lower lobe pulmonary arteries. Significantly decreased parenchymal perfusion. Pulmonary arterial pressures were measured. The mean main pulmonary arterial pressure was 38 mm Hg consistent with pulmonary arterial hypertension. The pigtail catheter was removed over a superstiff Amplatz wire. A 100 cm angled vertebral catheter was introduced over the wire and carefully navigated into the left lower lobe pulmonary artery. The superstiff Amplatz wire was again placed. The 24 [place] FlowTriever device was then advanced over the wire and into the left main pulmonary artery. The curved 20 French aspiration catheter was advanced coaxially deeper into the left lower lobe pulmonary artery. Suction thrombectomy was then performed. Multiple passes with both the 20 [place] and 24 [place] aspiration catheters were performed. Positive extirpation of extensive acute thrombus. Aspirated blood was returned to the circulation using the flow Saver device. Follow-up left pulmonary arteriography demonstrates near complete  resolution of thrombus with significant improvement in arterial perfusion to the entirety of the left pulmonary arterial tree. Therefore, the aspiration catheter was brought back into the main pulmonary artery and the vert catheter carefully advanced into the right main pulmonary artery. Right pulmonary arteriography was performed. Significant thrombus burden in the right main pulmonary artery extending into the truncus anterior and right lower lobe pulmonary arteries. The superstiff Amplatz wire was successfully advanced into  the right lower lobe pulmonary artery. The 24 French aspiration catheter was advanced into the vessel. Suction thrombectomy was then performed multiple times. Successful extirpation of extensive acute thrombus. Follow-up venography demonstrates near complete resolution of thrombus from the right lower lobe pulmonary artery. Small thrombi are present within segmental branches. There is some persistent thrombus in the truncus anterior. Therefore, the catheter was repositioned and additional thrombectomy was performed. At 1 point during the final passes, thrombus was adherent to the aspiration catheter and was brought back through the heart into the hepatic IVC. Additional aspiration was performed. At this time, the patient experienced some acute pain and had a vasovagal episode. This was self limited and resolved relatively quickly. Contrast injection through the 24 French sheath demonstrates no evidence of complication or significant residual thrombus adherent to the 24 French aspiration catheter. Therefore, the aspiration catheter was removed without complication. Final pressures were not obtained due to the vasovagal episode. FINDINGS: 1. Mean main pulmonary arterial pressure 38 mm Hg consistent with acute pulmonary arterial hypertension. 2. Follow-up pressures were not obtained following thrombectomy due to a vasovagal episode. IMPRESSION: 1. Large volume bilateral acute PE including saddle  embolus with acute pulmonary arterial hypertension and right heart strain. 2. Successful bilateral pulmonary arterial thrombectomy with extirpation of extensive acute thrombus. The patient's oxygenation and tachycardia improved immediately following successful thrombectomy. Electronically Signed   By: Malachy Moan M.D.   On: 10/25/2021 18:37   IR THROMBECT PRIM MECH INIT (INCLU) MOD SED  Result Date: 10/25/2021 INDICATION: 51 year old male with acute intermediate-high risk pulmonary emboli. He is not a candidate for systemic or catheter directed thrombolysis due to recent decompressive lumbar spine surgery. Therefore, he presents for urgent mechanical thrombectomy. EXAM: IR ULTRASOUND GUIDANCE VASC ACCESS RIGHT; ADDITIONAL ARTERIOGRAPHY; THROMBECTOMY MECHANICAL; BILATERAL PULMONARY ARTERIOGRAPHY COMPARISON:  CTA obtained earlier today MEDICATIONS: Seven thousand units heparin ANESTHESIA/SEDATION: Versed 2 mg IV; Fentanyl 100 mcg IV Moderate Sedation Time:  91 minutes The patient was continuously monitored during the procedure by the interventional radiology nurse under my direct supervision. FLUOROSCOPY TIME:  Radiation exposure index: 322 mGy COMPLICATIONS: None immediate. TECHNIQUE: Informed written consent was obtained from the patient after a thorough discussion of the procedural risks, benefits and alternatives. All questions were addressed. Maximal Sterile Barrier Technique was utilized including caps, mask, sterile gowns, sterile gloves, sterile drape, hand hygiene and skin antiseptic. A timeout was performed prior to the initiation of the procedure. The right common femoral vein was interrogated with ultrasound and found to be widely patent. An image was obtained and stored for the medical record. Local anesthesia was attained by infiltration with 1% lidocaine. A small dermatotomy was made. Under real-time sonographic guidance, the vessel was punctured with a 21 gauge micropuncture needle. Using  standard technique, the initial micro needle was exchanged over a 0.018 micro wire for a transitional 4 [place] micro sheath. The micro sheath was then exchanged over a 0.035 wire for a fascial dilator and the percutaneous tract was dilated to 16 [place]. The hydrophilic 24 [place] Inari sheath was then carefully advanced over the wire and into the inferior vena cava. A 6 French angled pigtail catheter was advanced through the right heart and into the main pulmonary artery. An initial pulmonary arteriogram was performed. There is large volume thrombus with significant clot burden in the left main and left lower pulmonary artery, a thin saddle embolus spanning the pulmonary artery bifurcation and a large thrombus at the confluence of the right truncus anterior and right  lower lobe pulmonary arteries. Significantly decreased parenchymal perfusion. Pulmonary arterial pressures were measured. The mean main pulmonary arterial pressure was 38 mm Hg consistent with pulmonary arterial hypertension. The pigtail catheter was removed over a superstiff Amplatz wire. A 100 cm angled vertebral catheter was introduced over the wire and carefully navigated into the left lower lobe pulmonary artery. The superstiff Amplatz wire was again placed. The 24 [place] FlowTriever device was then advanced over the wire and into the left main pulmonary artery. The curved 20 French aspiration catheter was advanced coaxially deeper into the left lower lobe pulmonary artery. Suction thrombectomy was then performed. Multiple passes with both the 20 [place] and 24 [place] aspiration catheters were performed. Positive extirpation of extensive acute thrombus. Aspirated blood was returned to the circulation using the flow Saver device. Follow-up left pulmonary arteriography demonstrates near complete resolution of thrombus with significant improvement in arterial perfusion to the entirety of the left pulmonary arterial tree. Therefore, the aspiration  catheter was brought back into the main pulmonary artery and the vert catheter carefully advanced into the right main pulmonary artery. Right pulmonary arteriography was performed. Significant thrombus burden in the right main pulmonary artery extending into the truncus anterior and right lower lobe pulmonary arteries. The superstiff Amplatz wire was successfully advanced into the right lower lobe pulmonary artery. The 24 French aspiration catheter was advanced into the vessel. Suction thrombectomy was then performed multiple times. Successful extirpation of extensive acute thrombus. Follow-up venography demonstrates near complete resolution of thrombus from the right lower lobe pulmonary artery. Small thrombi are present within segmental branches. There is some persistent thrombus in the truncus anterior. Therefore, the catheter was repositioned and additional thrombectomy was performed. At 1 point during the final passes, thrombus was adherent to the aspiration catheter and was brought back through the heart into the hepatic IVC. Additional aspiration was performed. At this time, the patient experienced some acute pain and had a vasovagal episode. This was self limited and resolved relatively quickly. Contrast injection through the 24 French sheath demonstrates no evidence of complication or significant residual thrombus adherent to the 24 French aspiration catheter. Therefore, the aspiration catheter was removed without complication. Final pressures were not obtained due to the vasovagal episode. FINDINGS: 1. Mean main pulmonary arterial pressure 38 mm Hg consistent with acute pulmonary arterial hypertension. 2. Follow-up pressures were not obtained following thrombectomy due to a vasovagal episode. IMPRESSION: 1. Large volume bilateral acute PE including saddle embolus with acute pulmonary arterial hypertension and right heart strain. 2. Successful bilateral pulmonary arterial thrombectomy with extirpation of  extensive acute thrombus. The patient's oxygenation and tachycardia improved immediately following successful thrombectomy. Electronically Signed   By: Malachy Moan M.D.   On: 10/25/2021 18:37   IR THROMBECT PRIM MECH ADD (INCLU) MOD SED  Result Date: 10/25/2021 INDICATION: 51 year old male with acute intermediate-high risk pulmonary emboli. He is not a candidate for systemic or catheter directed thrombolysis due to recent decompressive lumbar spine surgery. Therefore, he presents for urgent mechanical thrombectomy. EXAM: IR ULTRASOUND GUIDANCE VASC ACCESS RIGHT; ADDITIONAL ARTERIOGRAPHY; THROMBECTOMY MECHANICAL; BILATERAL PULMONARY ARTERIOGRAPHY COMPARISON:  CTA obtained earlier today MEDICATIONS: Seven thousand units heparin ANESTHESIA/SEDATION: Versed 2 mg IV; Fentanyl 100 mcg IV Moderate Sedation Time:  91 minutes The patient was continuously monitored during the procedure by the interventional radiology nurse under my direct supervision. FLUOROSCOPY TIME:  Radiation exposure index: 322 mGy COMPLICATIONS: None immediate. TECHNIQUE: Informed written consent was obtained from the patient after a thorough discussion of the  procedural risks, benefits and alternatives. All questions were addressed. Maximal Sterile Barrier Technique was utilized including caps, mask, sterile gowns, sterile gloves, sterile drape, hand hygiene and skin antiseptic. A timeout was performed prior to the initiation of the procedure. The right common femoral vein was interrogated with ultrasound and found to be widely patent. An image was obtained and stored for the medical record. Local anesthesia was attained by infiltration with 1% lidocaine. A small dermatotomy was made. Under real-time sonographic guidance, the vessel was punctured with a 21 gauge micropuncture needle. Using standard technique, the initial micro needle was exchanged over a 0.018 micro wire for a transitional 4 [place] micro sheath. The micro sheath was then  exchanged over a 0.035 wire for a fascial dilator and the percutaneous tract was dilated to 16 [place]. The hydrophilic 24 [place] Inari sheath was then carefully advanced over the wire and into the inferior vena cava. A 6 French angled pigtail catheter was advanced through the right heart and into the main pulmonary artery. An initial pulmonary arteriogram was performed. There is large volume thrombus with significant clot burden in the left main and left lower pulmonary artery, a thin saddle embolus spanning the pulmonary artery bifurcation and a large thrombus at the confluence of the right truncus anterior and right lower lobe pulmonary arteries. Significantly decreased parenchymal perfusion. Pulmonary arterial pressures were measured. The mean main pulmonary arterial pressure was 38 mm Hg consistent with pulmonary arterial hypertension. The pigtail catheter was removed over a superstiff Amplatz wire. A 100 cm angled vertebral catheter was introduced over the wire and carefully navigated into the left lower lobe pulmonary artery. The superstiff Amplatz wire was again placed. The 24 [place] FlowTriever device was then advanced over the wire and into the left main pulmonary artery. The curved 20 French aspiration catheter was advanced coaxially deeper into the left lower lobe pulmonary artery. Suction thrombectomy was then performed. Multiple passes with both the 20 [place] and 24 [place] aspiration catheters were performed. Positive extirpation of extensive acute thrombus. Aspirated blood was returned to the circulation using the flow Saver device. Follow-up left pulmonary arteriography demonstrates near complete resolution of thrombus with significant improvement in arterial perfusion to the entirety of the left pulmonary arterial tree. Therefore, the aspiration catheter was brought back into the main pulmonary artery and the vert catheter carefully advanced into the right main pulmonary artery. Right pulmonary  arteriography was performed. Significant thrombus burden in the right main pulmonary artery extending into the truncus anterior and right lower lobe pulmonary arteries. The superstiff Amplatz wire was successfully advanced into the right lower lobe pulmonary artery. The 24 French aspiration catheter was advanced into the vessel. Suction thrombectomy was then performed multiple times. Successful extirpation of extensive acute thrombus. Follow-up venography demonstrates near complete resolution of thrombus from the right lower lobe pulmonary artery. Small thrombi are present within segmental branches. There is some persistent thrombus in the truncus anterior. Therefore, the catheter was repositioned and additional thrombectomy was performed. At 1 point during the final passes, thrombus was adherent to the aspiration catheter and was brought back through the heart into the hepatic IVC. Additional aspiration was performed. At this time, the patient experienced some acute pain and had a vasovagal episode. This was self limited and resolved relatively quickly. Contrast injection through the 24 French sheath demonstrates no evidence of complication or significant residual thrombus adherent to the 24 French aspiration catheter. Therefore, the aspiration catheter was removed without complication. Final pressures were not  obtained due to the vasovagal episode. FINDINGS: 1. Mean main pulmonary arterial pressure 38 mm Hg consistent with acute pulmonary arterial hypertension. 2. Follow-up pressures were not obtained following thrombectomy due to a vasovagal episode. IMPRESSION: 1. Large volume bilateral acute PE including saddle embolus with acute pulmonary arterial hypertension and right heart strain. 2. Successful bilateral pulmonary arterial thrombectomy with extirpation of extensive acute thrombus. The patient's oxygenation and tachycardia improved immediately following successful thrombectomy. Electronically Signed   By:  Malachy Moan M.D.   On: 10/25/2021 18:37   CT Angio Chest PE W and/or Wo Contrast  Result Date: 10/25/2021 CLINICAL DATA:  Pulmonary embolism (PE) suspected, high prob EXAM: CT ANGIOGRAPHY CHEST WITH CONTRAST TECHNIQUE: Multidetector CT imaging of the chest was performed using the standard protocol during bolus administration of intravenous contrast. Multiplanar CT image reconstructions and MIPs were obtained to evaluate the vascular anatomy. RADIATION DOSE REDUCTION: This exam was performed according to the departmental dose-optimization program which includes automated exposure control, adjustment of the mA and/or kV according to patient size and/or use of iterative reconstruction technique. CONTRAST:  OMNIPAQUE IOHEXOL 350 MG/ML SOLN COMPARISON:  Chest radiograph from the same day. FINDINGS: Cardiovascular: Positive for acute PE extending into the left and right pulmonary arteries (saddle pulmonary embolism) as well as more distal lobar, segmental and subsegmental pulmonary arteries bilaterally. Evidence of right heart strain (RV/LV Ratio = 1.62). No pericardial effusion. Borderline cardiomegaly. Mediastinum/Nodes: No enlarged mediastinal, hilar, or axillary lymph nodes. Thyroid gland, trachea, and esophagus demonstrate no significant findings. Lungs/Pleura: Lungs are clear. No pleural effusion or pneumothorax. Upper Abdomen: No acute abnormality. Musculoskeletal: No chest wall abnormality. No acute or significant osseous findings. Review of the MIP images confirms the above findings. IMPRESSION: Positive for acute saddle pulmonary embolism (detailed above) with CT evidence of right heart strain (RV/LV Ratio = 1.62) consistent with at least submassive (intermediate risk) PE. The presence of right heart strain has been associated with an increased risk of morbidity and mortality. Please refer to the "Code PE Focused" order set in EPIC. Findings discussed with Dr. Buck Mam via telephone at 3:05 p.m.  Electronically Signed   By: Feliberto Harts M.D.   On: 10/25/2021 15:06   DG Chest Port 1 View  Result Date: 10/25/2021 CLINICAL DATA:  Chest pain EXAM: PORTABLE CHEST 1 VIEW COMPARISON:  Chest x-ray dated July 22, 2008 FINDINGS: The heart size and mediastinal contours are within normal limits. Both lungs are clear. Dislocation of the left acromioclavicular joint, likely chronic. IMPRESSION: 1. No acute cardiopulmonary abnormality. 2. Dislocation of the left acromioclavicular joint, likely chronic. Recommend clinical correlation. Electronically Signed   By: Allegra Lai M.D.   On: 10/25/2021 14:46    Labs:  CBC: Recent Labs    12/13/20 0000 10/16/21 1053 10/25/21 1416 10/26/21 0057  WBC 6.2 7.0 10.2 8.4  HGB 14.9 15.1 14.1 11.7*  HCT 42.8 43.4 41.2 33.2*  PLT 206 189 153 144*    COAGS: Recent Labs    10/25/21 1511  INR 1.1    BMP: Recent Labs    12/13/20 0000 10/16/21 1053 10/25/21 1416 10/26/21 0057  NA 140 138 143 139  K 4.3 4.5 4.1 3.7  CL 107 109 113* 111  CO2 GLUCOSE 93 90 105* 139*  BUN CALCIUM 8.8 8.8* 8.3* 7.8*  CREATININE 1.00 1.03 1.20 1.02  GFRNONAA  --  >60 >60 >60    LIVER FUNCTION TESTS: Recent Labs  12/13/20 0000 10/25/21 1416  BILITOT 0.5 0.8  AST 16 34  ALT 23 71*  ALKPHOS  --  52  PROT 6.4 6.1*  ALBUMIN  --  3.2*    Assessment and Plan: Sean Serrano is a 51 yo male PMH of hyperlipidemia, DVT, back pain with radiculopathy s/p bilateral laminotomies and discectomy L4-L5 with decompression of the L5 nerve roots and common dural tube on 10/19/21. He presented to [facility] ED on 10/25/21 with syncope and chest pressure. He was found to have an acute saddle PE and referred to IR for emergent PE thrombectomy with Dr Archer Asa.  Patient today states he is doing well and breathing much better. His puncture site is soft, non-tender, non-pulsatile, and not bleeding. O2 saturation is improved and in mid-to-high 90s at  time of exam.    Please reach out to IR if any questions or concerns arise.   Electronically Signed: Kennieth Francois, PA-C 10/26/2021, 9:50 AM   I spent a total of 15 Minutes at the the patient's bedside AND on the patient's hospital floor or unit, greater than 50% of which was counseling/coordinating care for s/p PE thrombectomy with Dr Archer Asa on 10/25/21.

## 2021-10-26 NOTE — Progress Notes (Signed)
NAME:  Sean Serrano, MRN:  [id number], DOB:  October 04, 1970, LOS: 1 ADMISSION DATE:  10/25/2021, CONSULTATION DATE:  10/25/21 REFERRING MD:  Stevie Kern - EM, CHIEF COMPLAINT:  Chest pain, syncope    History of Present Illness:  51 yo M PMH L4-L5 herniated disc with L4-L5 spinal stenosis and cauda equina who is POD 6 L4-L5 bilateral lumbar microdiscectomy (OR 10/19/21 [facility]). Post op course uncomplicated and he was discharged home 7/22.   Pt presented to experienced acute onset chest pain and felt like he was going to pass out to he sat down after walking in driveway.  Reports walking to mail box since surgery to get exercise.  He reports period of not remembering anything but then felt better, opted not to call 911, and called his wife to take him.  Had near syncopal episode in car, wife reports he was gray enroute to ER.  At baseline prior to May and his back issues, patient very active bicyclist and hiker.  He reports prior history of DVT after knee surgery treated with lovenox/ coumadin and again in LE but "wasn't the serious DVT" and treated with aspirin.  Also reports family history of blood clots in his father and sister.   Denied lower calf pain or swelling but did have new soreness/pain in left thigh today and mild shortness of breath.   In ER, he was noted to be tachycardic 120s and with initial room air saturations at 88% requiring 2LNC, and blood pressure normotensive. CTA chest obtained in ED which revealed saddle PE, evidence of R heart strain with RV:LV 1.62.  Labs pending.  Heparin gtt started.  Patient still complains of chest pressure.  EKG non acute.  PCCM consulted.   Pertinent  Medical History  Cauda equina, L4-L5 spinal stenosis and herniated nucleus pulposus  HLD ED Prior hx of DVT x2 (lovenox/ coumadin first- provoked after knee surgery, second on ASA only)  Significant Hospital Events: Including procedures, antibiotic start and stop dates in addition to other pertinent events    7/27 Saddle PE + syncope, tachycardia, mild hypoxia, Rv:LV on CTA chest 1.62 s/p thrombectomy by IR  Interim History / Subjective:   No acute events overnight Denies chest pain or shortness of breath Wife is at bedside. All questions answered.  Objective   Blood pressure 107/84, pulse 98, temperature 98.6 F (37 C), temperature source Oral, resp. rate 16, height 5\' 9"  (1.753 m), weight 85.8 kg, SpO2 92 %.        Intake/Output Summary (Last 24 hours) at 10/26/2021 7169 Last data filed at 10/26/2021 0400 Gross per 24 hour  Intake 2845.44 ml  Output 700 ml  Net 2145.44 ml    Filed Weights   10/25/21 1403  Weight: 85.8 kg   Examination: General:  middle aged male, no acute distress, resting in bed HEENT: [place]/AT, moist mucous membranes, sclera anicteric Neuro: alert and oriented x 3, moving all extremities CV: tachycardic, no murmur PULM:  clear to auscultation, no wheezing GI: soft, non-tender, non-distended, BS+ Extremities: warm, no edema Skin: no rashes  Resolved Hospital Problem list    Assessment & Plan:   Massive Saddle Pulmonary Embolus with Syncope Hx of DVT and Family Hx of DVT/PE - s/p thrombectomy by IR on 7/27 - continue heparin drip per pharmacy - Plan to transition to eliquis after 48 hours on heparin drip. Follow up labs below incase coumadin will be better treatment if he has antiphospholipid syndrome. - check anticardiolipin antibodies, lupus anticoagulant and  anti-beta2 abs - TTE and LE dopplers ordered - given family hx of blood clots and prior DVT x 2, will need outpatient hematology evaluation in the future  Lumbar radiculopathy s/p L4/L5 discectomy on 7/21 by Dr. Danielle Dess - NSGY aware of patient  HLD - can continue lipitor 7/28 pending LFTs  Best Practice (right click and "Reselect all SmartList Selections" daily)   Diet/type: Regular consistency (see orders) DVT prophylaxis: systemic heparin GI prophylaxis: N/A Lines: N/A Foley:  N/A Code  Status:  full code Last date of multidisciplinary goals of care discussion [7/27]  Patient and wife at bedside updated on plan of care.   Labs   CBC: Recent Labs  Lab 10/25/21 1416 10/26/21 0057  WBC 10.2 8.4  NEUTROABS 6.9  --   HGB 14.1 11.7*  HCT 41.2 33.2*  MCV 94.3 92.2  PLT 153 144*     Basic Metabolic Panel: Recent Labs  Lab 10/25/21 1416 10/26/21 0057  NA 143 139  K 4.1 3.7  CL 113* 111  CO2 22 22  GLUCOSE 105* 139*  BUN 20 19  CREATININE 1.20 1.02  CALCIUM 8.3* 7.8*   GFR: Estimated Creatinine Clearance: 90.9 mL/min (by C-G formula based on SCr of 1.02 mg/dL). Recent Labs  Lab 10/25/21 1416 10/25/21 1530 10/26/21 0057  WBC 10.2  --  8.4  LATICACIDVEN  --  1.9  --      Liver Function Tests: Recent Labs  Lab 10/25/21 1416  AST 34  ALT 71*  ALKPHOS 52  BILITOT 0.8  PROT 6.1*  ALBUMIN 3.2*   No results for input(s): "LIPASE", "AMYLASE" in the last 168 hours. No results for input(s): "AMMONIA" in the last 168 hours.  ABG No results found for: "PHART", "PCO2ART", "PO2ART", "HCO3", "TCO2", "ACIDBASEDEF", "O2SAT"   Coagulation Profile: Recent Labs  Lab 10/25/21 1511  INR 1.1    Cardiac Enzymes: No results for input(s): "CKTOTAL", "CKMB", "CKMBINDEX", "TROPONINI" in the last 168 hours.  HbA1C: Hgb A1c MFr Bld  Date/Time Value Ref Range Status  12/22/2018 08:09 AM 5.1 <5.7 % of total Hgb Final    Comment:    For the purpose of screening for the presence of diabetes: . <5.7%       Consistent with the absence of diabetes 5.7-6.4%    Consistent with increased risk for diabetes             (prediabetes) > or =6.5%  Consistent with diabetes . This assay result is consistent with a decreased risk of diabetes. . Currently, no consensus exists regarding use of hemoglobin A1c for diagnosis of diabetes in children. . According to American Diabetes Association (ADA) guidelines, hemoglobin A1c <7.0% represents optimal control in  non-pregnant diabetic patients. Different metrics may apply to specific patient populations.  Standards of Medical Care in Diabetes(ADA). Marland Kitchen   07/13/2018 08:32 AM 5.3 <5.7 % of total Hgb Final    Comment:    For the purpose of screening for the presence of diabetes: . <5.7%       Consistent with the absence of diabetes 5.7-6.4%    Consistent with increased risk for diabetes             (prediabetes) > or =6.5%  Consistent with diabetes . This assay result is consistent with a decreased risk of diabetes. . Currently, no consensus exists regarding use of hemoglobin A1c for diagnosis of diabetes in children. . According to American Diabetes Association (ADA) guidelines, hemoglobin A1c <7.0% represents optimal control in  non-pregnant diabetic patients. Different metrics may apply to specific patient populations.  Standards of Medical Care in Diabetes(ADA). .     CBG: Recent Labs  Lab 10/25/21 1958 10/25/21 2316 10/26/21 0319  GLUCAP 165* 146* 111*     Critical care time:  35 mins     Melody Comas, MD [facility] Office: [phone]   See Amion for personal pager PCCM on call pager [phone] until 7pm. Please call [facility] 7p-7a. [phone]

## 2021-10-26 NOTE — TOC Benefit Eligibility Note (Signed)
Patient Product/process development scientist completed.    The patient is currently admitted and upon discharge could be taking Xarelto Starter Pack.  The current 30 day co-pay is $222.74.   The patient is currently admitted and upon discharge could be taking Eliquis Starter Pack.  The current 30 day co-pay is $167.05.   The patient is insured through [employer] [facility] [employer]     Sean Serrano, CPhT Pharmacy Patient Advocate Specialist [facility] Patient Advocate Team Direct Number: [phone]  Fax: [phone]

## 2021-10-26 NOTE — Progress Notes (Addendum)
ANTICOAGULATION CONSULT NOTE  Pharmacy Consult for Heparin Indication: pulmonary embolus  No Known Allergies  Patient Measurements: Height: 5\' 9"  (175.3 cm) Weight: 85.8 kg (189 lb 2.5 oz) IBW/kg (Calculated) : 70.7 Heparin Dosing Weight: 85.8 kg  Vital Signs: Temp: 97.5 F (36.4 C) (07/28 0720) Temp Source: Axillary (07/28 0720) BP: 136/85 (07/28 1100) Pulse Rate: 92 (07/28 1100)  Labs: Recent Labs    10/25/21 1416 10/25/21 1511 10/25/21 1843 10/25/21 2157 10/26/21 0057 10/26/21 1005  HGB 14.1  --   --   --  11.7*  --   HCT 41.2  --   --   --  33.2*  --   PLT 153  --   --   --  144*  --   LABPROT  --  14.4  --   --   --   --   INR  --  1.1  --   --   --   --   HEPARINUNFRC  --   --   --  0.77* 0.60 0.44  CREATININE 1.20  --   --   --  1.02  --   TROPONINIHS 610*  --  4,536*  --   --   --     Estimated Creatinine Clearance: 90.9 mL/min (by C-G formula based on SCr of 1.02 mg/dL).   Medical History: Past Medical History:  Diagnosis Date   ED (erectile dysfunction)    Hyperlipidemia    PONV (postoperative nausea and vomiting)     Medications:  Medications Prior to Admission  Medication Sig Dispense Refill Last Dose   predniSONE (STERAPRED UNI-PAK 48 TAB) 10 MG (48) TBPK tablet Take 10-60 mg by mouth See admin instructions. 12 day dose pack.  Day 1: 60 mg Day 2: 60 mg Day 3: 50 mg Day 4: 50 mg Day 5: 40 mg Day 6: 40 mg Day 7: 30 mg Day 8: 30 mg Day 9: 20 mg Day 10: 20 mg Day 11: 10 mg Day 12: 10 mg      atorvastatin (LIPITOR) 10 MG tablet Take 1 tablet (10 mg total) by mouth daily. 90 tablet 3    calcium carbonate (TUMS EX) 750 MG chewable tablet Chew 750 mg by mouth daily as needed for heartburn.      famotidine (PEPCID) 20 MG tablet Take 20 mg by mouth daily.      HYDROcodone-acetaminophen (NORCO/VICODIN) 5-325 MG tablet Take 1-2 tablets by mouth every 4 (four) hours as needed (pain). 30 tablet 0    methocarbamol (ROBAXIN) 500 MG tablet Take 1  tablet (500 mg total) by mouth every 6 (six) hours as needed for muscle spasms. 30 tablet 2    naproxen (NAPROSYN) 500 MG tablet TAKE 1 TABLET BY MOUTH 2 TIMES DAILY WITH A MEAL. (Patient taking differently: Take 500 mg by mouth 2 (two) times daily with a meal.) 60 tablet 3    tadalafil (CIALIS) 5 MG tablet Take 1 tablet (5 mg total) by mouth daily. 30 tablet 11     Scheduled:   Chlorhexidine Gluconate Cloth  6 each Topical Daily   famotidine  20 mg Oral Daily   Infusions:   heparin 1,450 Units/hr (10/26/21 1000)   PRN:   Assessment: 51 yom with a history of HLD, s/p large lumbar microdiscectomy7/21/23. Patient is presenting with dizziness and chest pain. Patient was not on anticoagulation prior to admission. Heparin per pharmacy consult placed for pulmonary embolus. Found to have saddle PE in the ED. Pt s/p  IR mechanical thrombectomy on 7/27. Initially with bleeding from L femoral dressing site, quik-clot applied and hemostasis achieved. No further signs of bleeding noted.   Heparin level = 0.44 - Therapeutic for goal x 2 checks Current heparin infusion rate: 1450 units/hr Tolerating well. CBC down some this AM post thrombectomy but no overt bleeding.  Patient complains of headache this AM per RN.   Copay check completed (see separate note): Eliquis Starter Pack copay is $167.05;  Xarelto Starter Pack copay is $222.74  7/28 AM update:  Heparin level therapeutic after rate decrease  Goal of Therapy:  Heparin level 0.3-0.7 units/ml Monitor platelets by anticoagulation protocol: Yes   Plan:  Continue heparin 1450 units/hr Daily heparin level and CBC while on therapy.  Possible plan to transition to oral therapy in next 24-48 hours.   Link Snuffer, PharmD, BCPS, BCCCP Clinical Pharmacist Please refer to [facility] for [facility] Pharmacy numbers 10/26/2021   Addendum:  7/28 AM update:  Heparin level therapeutic after rate decrease  Plan:  Continue heparin 1450 units/hr Daily heparin  level and CBC while on therapy.  Possible plan to transition to oral therapy in next 24-48 hours.   Abran Duke, PharmD, BCPS Clinical Pharmacist Phone: [phone]

## 2021-10-26 NOTE — Telephone Encounter (Signed)
Pharmacy Patient Advocate Encounter  Insurance verification completed.    The patient is insured through [facility] [employer]   The patient is currently admitted and ran test claims for the following: Eliquis, Xarelto.  Copays and coinsurance results were relayed to Inpatient clinical team.

## 2021-10-26 NOTE — Progress Notes (Signed)
ANTICOAGULATION CONSULT NOTE  Pharmacy Consult for Heparin Indication: pulmonary embolus  No Known Allergies  Patient Measurements: Height: 5\' 9"  (175.3 cm) Weight: 85.8 kg (189 lb 2.5 oz) IBW/kg (Calculated) : 70.7 Heparin Dosing Weight: 85.8 kg  Vital Signs: Temp: 98.1 F (36.7 C) (07/27 2317) Temp Source: Oral (07/27 2317) BP: 115/85 (07/28 0000) Pulse Rate: 103 (07/28 0000)  Labs: Recent Labs    10/25/21 1416 10/25/21 1511 10/25/21 1843 10/25/21 2157 10/26/21 0057  HGB 14.1  --   --   --  11.7*  HCT 41.2  --   --   --  33.2*  PLT 153  --   --   --  144*  LABPROT  --  14.4  --   --   --   INR  --  1.1  --   --   --   HEPARINUNFRC  --   --   --  0.77* 0.60  CREATININE 1.20  --   --   --  1.02  TROPONINIHS 610*  --  4,536*  --   --      Estimated Creatinine Clearance: 90.9 mL/min (by C-G formula based on SCr of 1.02 mg/dL).   Medical History: Past Medical History:  Diagnosis Date   ED (erectile dysfunction)    Hyperlipidemia    PONV (postoperative nausea and vomiting)     Medications:  Medications Prior to Admission  Medication Sig Dispense Refill Last Dose   atorvastatin (LIPITOR) 10 MG tablet Take 1 tablet (10 mg total) by mouth daily. 90 tablet 3    calcium carbonate (TUMS EX) 750 MG chewable tablet Chew 750 mg by mouth daily as needed for heartburn.      famotidine (PEPCID) 20 MG tablet Take 20 mg by mouth daily.      HYDROcodone-acetaminophen (NORCO/VICODIN) 5-325 MG tablet Take 1-2 tablets by mouth every 4 (four) hours as needed (pain). 30 tablet 0    methocarbamol (ROBAXIN) 500 MG tablet Take 1 tablet (500 mg total) by mouth every 6 (six) hours as needed for muscle spasms. 30 tablet 2    naproxen (NAPROSYN) 500 MG tablet TAKE 1 TABLET BY MOUTH 2 TIMES DAILY WITH A MEAL. (Patient taking differently: Take 500 mg by mouth 2 (two) times daily with a meal.) 60 tablet 3    tadalafil (CIALIS) 5 MG tablet Take 1 tablet (5 mg total) by mouth daily. 30 tablet  11     Scheduled:    Infusions:   heparin 1,450 Units/hr (10/25/21 2339)   PRN:   Assessment: 51 yom with a history of HLD, s/p large lumbar microdiscectomy7/21/23. Patient is presenting with dizziness and chest pain. Patient was not on anticoagulation prior to admission. Heparin per pharmacy consult placed for pulmonary embolus. Found to have saddle PE in the ED. Pt s/p IR mechanical thrombectomy on 7/27. Initially with bleeding from L femoral dressing site, quik-clot applied and hemostasis achieved. No further signs of bleeding noted.   Heparin level = 0.77 (drawn ~5 hours after last bolus) Current heparin infusion rate: 1550 units/hr Pt received 2 heparin boluses in IR - 5000 units at 1629 and 2000 units at 1651  7/28 AM update:  Heparin level therapeutic after rate decrease  Goal of Therapy:  Heparin level 0.3-0.7 units/ml Monitor platelets by anticoagulation protocol: Yes   Plan:  Cont heparin 1450 units/hr 1000 heparin level  8/28, PharmD, BCPS Clinical Pharmacist Phone: [phone]

## 2021-10-26 NOTE — Progress Notes (Signed)
Bilateral LE venous duplex study completed. Preliminary results given to RN Louis. Please see CV Proc for preliminary results.  Angeni Chaudhuri BS, RVT 10/26/2021 11:37 AM

## 2021-10-27 ENCOUNTER — Telehealth: Payer: Self-pay | Admitting: Pulmonary Disease

## 2021-10-27 LAB — HEPARIN LEVEL (UNFRACTIONATED): Heparin Unfractionated: 0.33 IU/mL (ref 0.30–0.70)

## 2021-10-27 LAB — CBC
HCT: 32.8 % — ABNORMAL LOW (ref 39.0–52.0)
Hemoglobin: 11.6 g/dL — ABNORMAL LOW (ref 13.0–17.0)
MCH: 32.2 pg (ref 26.0–34.0)
MCHC: 35.4 g/dL (ref 30.0–36.0)
MCV: 91.1 fL (ref 80.0–100.0)
Platelets: 158 10*3/uL (ref 150–400)
RBC: 3.6 MIL/uL — ABNORMAL LOW (ref 4.22–5.81)
RDW: 12.1 % (ref 11.5–15.5)
WBC: 7.9 10*3/uL (ref 4.0–10.5)
nRBC: 0 % (ref 0.0–0.2)

## 2021-10-27 LAB — CARDIOLIPIN ANTIBODIES, IGG, IGM, IGA
Anticardiolipin IgA: <9 APL U/mL (ref 0–11)
Anticardiolipin IgG: <9 GPL U/mL (ref 0–14)
Anticardiolipin IgM: <9 MPL U/mL (ref 0–12)

## 2021-10-27 MED ORDER — FAMOTIDINE 20 MG PO TABS
20.0000 mg | ORAL_TABLET | Freq: Every day | ORAL | Status: DC
  Administered 2021-10-27: 20 mg via ORAL
  Filled 2021-10-27: qty 1

## 2021-10-27 MED ORDER — APIXABAN 5 MG PO TABS: 5.0000 mg | ORAL_TABLET | Freq: Two times a day (BID) | ORAL | Status: DC

## 2021-10-27 MED ORDER — APIXABAN 5 MG PO TABS
10.0000 mg | ORAL_TABLET | Freq: Two times a day (BID) | ORAL | Status: DC
  Administered 2021-10-27 – 2021-10-28 (×2): 10 mg via ORAL
  Filled 2021-10-27 (×2): qty 2

## 2021-10-27 NOTE — Progress Notes (Addendum)
Addendum: Consulted to transition from IV Heparin to Apixaban therapy this PM.   Plan: At 1700, wills start Apixaban 10 mg po BID for 7 days then 5 mg po BID.  Stop IV Heparin at the same time that the first dose of Apixaban is administered.  Monitor CBC, renal/liver function, and for signs and symptoms of bleeding.  Link Snuffer, PharmD, BCPS, BCCCP Clinical Pharmacist Please refer to [facility] Pharmacy numbers 10/27/2021, 1:09 PM    ANTICOAGULATION CONSULT NOTE  Pharmacy Consult for Heparin Indication: pulmonary embolus  No Known Allergies  Patient Measurements: Height: 5\' 9"  (175.3 cm) Weight: 85.8 kg (189 lb 2.5 oz) IBW/kg (Calculated) : 70.7 Heparin Dosing Weight: 85.8 kg  Vital Signs: Temp: 98.4 F (36.9 C) (07/29 0310) Temp Source: Oral (07/29 0310) BP: 111/72 (07/29 0600) Pulse Rate: 86 (07/29 0000)  Labs: Recent Labs    10/25/21 1416 10/25/21 1511 10/25/21 1843 10/25/21 2157 10/26/21 0057 10/26/21 1005 10/27/21 0014  HGB 14.1  --   --   --  11.7*  --  11.6*  HCT 41.2  --   --   --  33.2*  --  32.8*  PLT 153  --   --   --  144*  --  158  LABPROT  --  14.4  --   --   --   --   --   INR  --  1.1  --   --   --   --   --   HEPARINUNFRC  --   --   --    < > 0.60 0.44 0.33  CREATININE 1.20  --   --   --  1.02  --   --   TROPONINIHS 610*  --  4,536*  --   --   --   --    < > = values in this interval not displayed.    Estimated Creatinine Clearance: 90.9 mL/min (by C-G formula based on SCr of 1.02 mg/dL).   Medical History: Past Medical History:  Diagnosis Date   ED (erectile dysfunction)    Hyperlipidemia    PONV (postoperative nausea and vomiting)     Medications:  Medications Prior to Admission  Medication Sig Dispense Refill Last Dose   acetaminophen (TYLENOL) 500 MG tablet Take 1,000 mg by mouth daily as needed for headache.   Past Week   aspirin EC 81 MG tablet Take 324 mg by mouth once.   10/25/2021   atorvastatin (LIPITOR) 10 MG  tablet Take 1 tablet (10 mg total) by mouth daily. 90 tablet 3 10/25/2021   calcium carbonate (TUMS EX) 750 MG chewable tablet Chew 750 mg by mouth daily as needed for heartburn.   Past Week   diphenhydramine-acetaminophen (TYLENOL PM) 25-500 MG TABS tablet Take 2 tablets by mouth at bedtime as needed (pain, sleep).   Past Week   famotidine (PEPCID) 10 MG tablet Take 10 mg by mouth daily as needed for heartburn.   Past Week   HYDROcodone-acetaminophen (NORCO/VICODIN) 5-325 MG tablet Take 1-2 tablets by mouth every 4 (four) hours as needed (pain). (Patient taking differently: Take 1 tablet by mouth every 4 (four) hours as needed (pain).) 30 tablet 0 Past Week   methocarbamol (ROBAXIN) 500 MG tablet Take 1 tablet (500 mg total) by mouth every 6 (six) hours as needed for muscle spasms. 30 tablet 2 Past Week   naproxen (NAPROSYN) 500 MG tablet TAKE 1 TABLET BY MOUTH 2 TIMES DAILY WITH A MEAL. (  Patient taking differently: Take 500 mg by mouth 2 (two) times daily with a meal.) 60 tablet 3 10/25/2021   Soft Lens Products (REWETTING DROPS) SOLN Place 1 drop into both eyes daily. [employer] brand lubricating + rewetting eye drops   10/25/2021   tadalafil (CIALIS) 5 MG tablet Take 1 tablet (5 mg total) by mouth daily. 30 tablet 11 10/25/2021   predniSONE (STERAPRED UNI-PAK 48 TAB) 10 MG (48) TBPK tablet Take 10-60 mg by mouth See admin instructions. 12 day dose pack.  Day 1: 60 mg Day 2: 60 mg Day 3: 50 mg Day 4: 50 mg Day 5: 40 mg Day 6: 40 mg Day 7: 30 mg Day 8: 30 mg Day 9: 20 mg Day 10: 20 mg Day 11: 10 mg Day 12: 10 mg (Patient not taking: Reported on 10/26/2021)   Not Taking    Scheduled:   Chlorhexidine Gluconate Cloth  6 each Topical Daily   famotidine  20 mg Oral Daily   Infusions:   heparin 1,450 Units/hr (10/27/21 0600)   PRN:   Assessment: 51 yom with a history of HLD, s/p large lumbar microdiscectomy7/21/23. Patient is presenting with dizziness and chest pain. Patient was not on  anticoagulation prior to admission. Heparin per pharmacy consult placed for pulmonary embolus. Found to have saddle PE in the ED. Pt s/p IR mechanical thrombectomy on 7/27. Initially with bleeding from L femoral dressing site, quik-clot applied and hemostasis achieved. No further signs of bleeding noted.   Heparin level = 0.33- Therapeutic but trending down for goal Current heparin infusion rate: 1450 units/hr Tolerating well. CBC down some this AM post thrombectomy but no overt bleeding.  Patient complains of headache this AM per RN.   Copay check completed (see separate note): Eliquis Starter Pack copay is $167.05;  Xarelto Starter Pack copay is $222.74  Goal of Therapy:  Heparin level 0.3-0.7 units/ml Monitor platelets by anticoagulation protocol: Yes   Plan:  Increase  heparin to 1500 units/hr to keep in goal Daily heparin level and CBC while on therapy.  Possible plan to transition to oral therapy in next 12-24 hours.   Link Snuffer, PharmD, BCPS, BCCCP Clinical Pharmacist Please refer to [facility] for [facility] numbers 10/27/2021, 7:39 AM

## 2021-10-27 NOTE — Discharge Instructions (Signed)
Information on my medicine - ELIQUIS (apixaban)  This medication education was reviewed with me or my healthcare representative as part of my discharge preparation.    Why was Eliquis prescribed for you? Eliquis was prescribed to treat blood clots that may have been found in the veins of your legs (deep vein thrombosis) or in your lungs (pulmonary embolism) and to reduce the risk of them occurring again.  What do You need to know about Eliquis ? The starting dose is 10 mg (two 5 mg tablets) taken TWICE daily for the FIRST SEVEN (7) DAYS, then on Sunday 11/04/2021 the dose is reduced to ONE 5 mg tablet taken TWICE daily.  Eliquis may be taken with or without food.   Try to take the dose about the same time in the morning and in the evening. If you have difficulty swallowing the tablet whole please discuss with your pharmacist how to take the medication safely.  Take Eliquis exactly as prescribed and DO NOT stop taking Eliquis without talking to the doctor who prescribed the medication.  Stopping may increase your risk of developing a new blood clot.  Refill your prescription before you run out.  After discharge, you should have regular check-up appointments with your healthcare provider that is prescribing your Eliquis.    What do you do if you miss a dose? If a dose of ELIQUIS is not taken at the scheduled time, take it as soon as possible on the same day and twice-daily administration should be resumed. The dose should not be doubled to make up for a missed dose.  Important Safety Information A possible side effect of Eliquis is bleeding. You should call your healthcare provider right away if you experience any of the following: Bleeding from an injury or your nose that does not stop. Unusual colored urine (red or dark brown) or unusual colored stools (red or black). Unusual bruising for unknown reasons. A serious fall or if you hit your head (even if there is no bleeding).  Some  medicines may interact with Eliquis and might increase your risk of bleeding or clotting while on Eliquis. To help avoid this, consult your healthcare provider or pharmacist prior to using any new prescription or non-prescription medications, including herbals, vitamins, non-steroidal anti-inflammatory drugs (NSAIDs) and supplements.  This website has more information on Eliquis (apixaban): http://www.eliquis.com/eliquis/home

## 2021-10-27 NOTE — Telephone Encounter (Signed)
Please schedule follow up with me in 4-6 weeks for pulmonary emboli and DVT.   Thanks, JD

## 2021-10-27 NOTE — Discharge Summary (Signed)
Physician Discharge Summary         Patient ID: Sean Serrano MRN: [id number] DOB/AGE: Nov 16, 1970 51 y.o.  Admit date: 10/25/2021 Discharge date: 10/28/2021  Discharge Diagnoses:   Massive Saddle Pulmonary Embolus with Syncope Hx of DVT and Family Hx of DVT/PE New Acute Left LLE DVT and Chronic RLE DVT Lumbar radiculopathy s/p L4/L5 discectomy  HLD  Discharge summary   51 yo M PMH L4-L5 herniated disc with L4-L5 spinal stenosis and cauda equina who is POD 6 L4-L5 bilateral lumbar microdiscectomy (OR 10/19/21 [facility]). Post op course uncomplicated and he was discharged home 7/22.   Pt presented to ED with acute onset chest pain and felt like he was going to pass out to he sat down after walking in driveway.  Reports walking to mail box since surgery to get exercise.  He reports period of not remembering anything but then felt better, opted not to call 911, and called his wife to take him.  Had near syncopal episode in car, wife reports he was gray enroute to ER.  At baseline prior to May and his back issues, patient very active bicyclist and hiker. He reports prior history of DVT after knee surgery treated with lovenox/ coumadin and again in LE but "wasn't the serious DVT" and treated with aspirin.  Also reports family history of blood clots in his father and sister. Denied lower calf pain or swelling but did have new soreness/pain in left thigh today and mild shortness of breath.    In ER, he was noted to be tachycardic 120s and with initial room air saturations at 88% requiring 2LNC, and blood pressure normotensive. CTA chest obtained in ED which revealed saddle PE, evidence of R heart strain with RV:LV 1.62.  Labs pending.  Heparin gtt started.  Patient still complains of chest pressure.  EKG non acute.  PCCM consulted.   Given elevated troponin with signs of RV dysfunction including flattened D-shaped septum on echocardiogram 2/28 decision was made to consult interventional radiology and  patient underwent thrombectomy 7/27.  Tolerated procedure well.  By 7/29 he was able to transition from IV heparin (after 48 hours )to p.o. Eliquis.  By 7/30 patient remained stable after transition to apixaban, is able to tolerate oral diet and ambulating per baseline.  Therefore, he will be discharged home.  Patient will need outpatient pulmonary follow-up with repeat echocardiogram in 3 months as well as hematology work-up  Discharge Plan by Active Problems   Massive Saddle Pulmonary Embolus with Syncope Hx of DVT and Family Hx of DVT/PE New Acute Left LLE DVT and Chronic RLE DVT - s/p thrombectomy by IR on 7/27 P: Continue p.o. Eliquis upon discharge Outpatient pulmonary follow-up appointment established Patient will also need outpatient hematology follow-up Anticardiolipin antibodies, lupus antibody, and antibeta 2 antibody all pending We will need repeat echocardiogram within 3 months   Lumbar radiculopathy s/p L4/L5 discectomy  -Preformed 7/21 by Dr. Danielle Dess P: Follow-up per neurosurgery   HLD P: Continue home Lipitor upon discharge   Significant Hospital tests/events  7/27 Saddle PE + syncope, tachycardia, mild hypoxia, Rv:LV on CTA chest 1.62 s/p thrombectomy by IR 7/29 transition from IV heparin to p.o. Eliquis   Procedures   7/27 Thrombectomy per IR  Culture data/antimicrobials   MRSA PCR negative   Consults  Neurosurgery IR    Discharge Exam: BP 135/79 (BP Location: Left Arm)   Pulse 92   Temp 98.3 F (36.8 C) (Oral)   Resp 17  Ht  (1.753 m)   Wt 86.3 kg   SpO2 98%   BMI 28.10 kg/m   Physical Exam  General:  middle aged male, no acute distress, resting in bed HEENT: [place]/AT, moist mucous membranes, sclera anicteric Neuro: alert and oriented x 3, moving all extremities CV: tachycardic, no murmur PULM:  clear to auscultation, no wheezing GI: soft, non-tender, non-distended, BS+ Extremities: warm, no edema Skin: no rashes  Labs at discharge    Lab Results  Component Value Date   CREATININE 1.02 10/26/2021   BUN 19 10/26/2021   NA 139 10/26/2021   K 3.7 10/26/2021   CL 111 10/26/2021   CO2 22 10/26/2021   Lab Results  Component Value Date   WBC 7.3 10/28/2021   HGB 11.5 (L) 10/28/2021   HCT 32.7 (L) 10/28/2021   MCV 91.3 10/28/2021   PLT 165 10/28/2021   Lab Results  Component Value Date   ALT 71 (H) 10/25/2021   AST 34 10/25/2021   ALKPHOS 52 10/25/2021   BILITOT 0.8 10/25/2021   Lab Results  Component Value Date   INR 1.1 10/25/2021    Current radiological studies    VAS [place] LOWER EXTREMITY VENOUS (DVT)  Result Date: 10/26/2021  Lower Venous DVT Study Patient Name:  Sean Serrano  Date of Exam:   10/26/2021 Medical Rec #: [id number]         Accession #:    [id number] Date of Birth: 1971/02/03          Patient Gender: M Patient Age:   80 years Exam Location:  [facility] Procedure:      VAS [place] LOWER EXTREMITY VENOUS (DVT) Referring Phys: Gunnar Fusi SIMPSON --------------------------------------------------------------------------------  Indications: Pain.  Comparison Study: No previous exam noted. Performing Technologist: Magdalene River BS, RVT  Examination Guidelines: A complete evaluation includes B-mode imaging, spectral Doppler, color Doppler, and power Doppler as needed of all accessible portions of each vessel. Bilateral testing is considered an integral part of a complete examination. Limited examinations for reoccurring indications may be performed as noted. The reflux portion of the exam is performed with the patient in reverse Trendelenburg.  +---------+---------------+---------+-----------+---------------+--------------+ RIGHT    CompressibilityPhasicitySpontaneityProperties     Thrombus Aging +---------+---------------+---------+-----------+---------------+--------------+ CFV      Full           Yes      Yes                                       +---------+---------------+---------+-----------+---------------+--------------+ SFJ      Full                                                             +---------+---------------+---------+-----------+---------------+--------------+ FV Prox  Full                                                             +---------+---------------+---------+-----------+---------------+--------------+ FV Mid   Full                                                             +---------+---------------+---------+-----------+---------------+--------------+  FV DistalFull                                                             +---------+---------------+---------+-----------+---------------+--------------+ PFV      Full                                                             +---------+---------------+---------+-----------+---------------+--------------+ POP      Partial        Yes      Yes        partially      Chronic                                                    re-cannalized                 +---------+---------------+---------+-----------+---------------+--------------+ PTV      Full                                                             +---------+---------------+---------+-----------+---------------+--------------+ PERO     Full                                                             +---------+---------------+---------+-----------+---------------+--------------+   +---------+---------------+---------+-----------+----------+--------------+ LEFT     CompressibilityPhasicitySpontaneityPropertiesThrombus Aging +---------+---------------+---------+-----------+----------+--------------+ CFV      Full           Yes      Yes                                 +---------+---------------+---------+-----------+----------+--------------+ SFJ      Full                                                         +---------+---------------+---------+-----------+----------+--------------+ FV Prox  None           No       No                   Acute          +---------+---------------+---------+-----------+----------+--------------+ FV Mid   Partial        Yes      Yes                  Acute          +---------+---------------+---------+-----------+----------+--------------+  FV DistalPartial        Yes      Yes                  Acute          +---------+---------------+---------+-----------+----------+--------------+ PFV      Full                                         Acute          +---------+---------------+---------+-----------+----------+--------------+ POP      None           No       No                   Acute          +---------+---------------+---------+-----------+----------+--------------+ PTV      None           No       No                   Acute          +---------+---------------+---------+-----------+----------+--------------+ PERO     None           No       No                   Acute          +---------+---------------+---------+-----------+----------+--------------+     Summary: RIGHT: - Findings consistent with chronic deep vein thrombosis involving the right popliteal vein. - All other veins visualized appear fully compressible and demonstrate appropriate Doppler characteristics.  LEFT: - Findings consistent with acute deep vein thrombosis involving the left femoral vein, left popliteal vein, left posterior tibial veins, and left peroneal veins.  *See table(s) above for measurements and observations. Electronically signed by Heath Lark on 10/26/2021 at 2:11:18 PM.    Final    ECHOCARDIOGRAM COMPLETE  Result Date: 10/26/2021    ECHOCARDIOGRAM REPORT   Patient Name:   Sean Serrano Date of Exam: 10/26/2021 Medical Rec #:  [id number]        Height:       69.0 in Accession #:    [id number]       Weight:       189.2 lb Date of Birth:  1971/02/17         BSA:           2.018 m Patient Age:    51 years         BP:           134/86 mmHg Patient Gender: M                HR:           92 bpm. Exam Location:  Inpatient Procedure: 2D Echo Indications:    pulmonary emboli  History:        Patient has no prior history of Echocardiogram examinations.                 Risk Factors:Dyslipidemia.  Sonographer:    Delcie Roch RDCS Referring Phys: Wayne Both SIMPSON IMPRESSIONS  1. Left ventricular ejection fraction, by estimation, is 60 to 65%. The left ventricle has normal function. The left ventricle has no regional wall motion abnormalities. Left ventricular diastolic parameters were normal. There is the interventricular septum is flattened in  diastole ('D' shaped left ventricle), consistent with right ventricular volume overload.  2. Right ventricular systolic function is low normal. The right ventricular size is normal. Tricuspid regurgitation signal is inadequate for assessing PA pressure.  3. The mitral valve is normal in structure. No evidence of mitral valve regurgitation.  4. The aortic valve is tricuspid. Aortic valve regurgitation is not visualized.  5. The inferior vena cava is normal in size with greater than 50% respiratory variability, suggesting right atrial pressure of 3 mmHg. Comparison(s): No prior Echocardiogram. Conclusion(s)/Recommendation(s): Flattening of the RV septum consistent with diagnosis of PE. FINDINGS  Left Ventricle: Left ventricular ejection fraction, by estimation, is 60 to 65%. The left ventricle has normal function. The left ventricle has no regional wall motion abnormalities. The left ventricular internal cavity size was normal in size. There is  borderline left ventricular hypertrophy. The interventricular septum is flattened in diastole ('D' shaped left ventricle), consistent with right ventricular volume overload. Left ventricular diastolic parameters were normal. Right Ventricle: The right ventricular size is normal. Right vetricular wall  thickness was not well visualized. Right ventricular systolic function is low normal. Tricuspid regurgitation signal is inadequate for assessing PA pressure. Left Atrium: Left atrial size was normal in size. Right Atrium: Right atrial size was normal in size. Pericardium: There is no evidence of pericardial effusion. Mitral Valve: The mitral valve is normal in structure. No evidence of mitral valve regurgitation. Tricuspid Valve: The tricuspid valve is normal in structure. Tricuspid valve regurgitation is not demonstrated. Aortic Valve: The aortic valve is tricuspid. Aortic valve regurgitation is not visualized. Pulmonic Valve: Pulmonic valve regurgitation is not visualized. Aorta: The aortic root and ascending aorta are structurally normal, with no evidence of dilitation. Venous: The inferior vena cava is normal in size with greater than 50% respiratory variability, suggesting right atrial pressure of 3 mmHg. IAS/Shunts: No atrial level shunt detected by color flow Doppler.  LEFT VENTRICLE PLAX 2D LVIDd:         4.70 cm   Diastology LVIDs:         3.00 cm   LV e' medial:    7.29 cm/s LV PW:         1.00 cm   LV E/e' medial:  8.0 LV IVS:        1.10 cm   LV e' lateral:   11.40 cm/s LVOT diam:     2.20 cm   LV E/e' lateral: 5.1 LV SV:         64 LV SV Index:   32 LVOT Area:     3.80 cm  RIGHT VENTRICLE            IVC RV S prime:     9.90 cm/s  IVC diam: 2.30 cm TAPSE (M-mode): 1.1 cm LEFT ATRIUM             Index        RIGHT ATRIUM           Index LA diam:        3.50 cm 1.73 cm/m   RA Area:     11.20 cm LA Vol (A2C):   44.7 ml 22.15 ml/m  RA Volume:   24.90 ml  12.34 ml/m LA Vol (A4C):   62.8 ml 31.13 ml/m LA Biplane Vol: 58.8 ml 29.14 ml/m  AORTIC VALVE LVOT Vmax:   111.00 cm/s LVOT Vmean:  66.100 cm/s LVOT VTI:    0.169 m  AORTA Ao Root diam: 3.40 cm Ao  Asc diam:  3.20 cm MITRAL VALVE MV Area (PHT): 4.49 cm    SHUNTS MV Decel Time: 169 msec    Systemic VTI:  0.17 m MV E velocity: 58.40 cm/s  Systemic  Diam: 2.20 cm MV A velocity: 60.50 cm/s MV E/A ratio:  0.97 [place] signed by Carolan Clines Signature Date/Time: 10/26/2021/11:28:20 AM    Final     Disposition:     There are no questions and answers to display.          Allergies as of 10/28/2021   No Known Allergies      Medication List     STOP taking these medications    aspirin EC 81 MG tablet   naproxen 500 MG tablet Commonly known as: NAPROSYN   predniSONE 10 MG (48) Tbpk tablet Commonly known as: STERAPRED UNI-PAK 48 TAB       TAKE these medications    acetaminophen 500 MG tablet Commonly known as: TYLENOL Take 1,000 mg by mouth daily as needed for headache.   apixaban 5 MG Tabs tablet Commonly known as: ELIQUIS Take 2 tablets (10 mg total) by mouth 2 (two) times daily for 6 days.   apixaban 5 MG Tabs tablet Commonly known as: ELIQUIS Take 1 tablet (5 mg total) by mouth 2 (two) times daily. Start taking on: November 04, 2021   atorvastatin 10 MG tablet Commonly known as: LIPITOR Take 1 tablet (10 mg total) by mouth daily.   calcium carbonate 750 MG chewable tablet Commonly known as: TUMS EX Chew 750 mg by mouth daily as needed for heartburn.   diphenhydramine-acetaminophen 25-500 MG Tabs tablet Commonly known as: TYLENOL PM Take 2 tablets by mouth at bedtime as needed (pain, sleep).   famotidine 10 MG tablet Commonly known as: PEPCID Take 10 mg by mouth daily as needed for heartburn.   HYDROcodone-acetaminophen 5-325 MG tablet Commonly known as: NORCO/VICODIN Take 1-2 tablets by mouth every 4 (four) hours as needed (pain). What changed: how much to take   methocarbamol 500 MG tablet Commonly known as: ROBAXIN Take 1 tablet (500 mg total) by mouth every 6 (six) hours as needed for muscle spasms.   Rewetting Drops Soln Place 1 drop into both eyes daily. [employer] brand lubricating + rewetting eye drops   tadalafil 5 MG tablet Commonly known as: CIALIS Take 1 tablet (5 mg  total) by mouth daily.         Follow-up appointment   Pulmonary  Hematology  Neurosurgery   Discharge Condition:    stable   Signed: Martina Sinner 10/28/2021, 9:44 AM

## 2021-10-27 NOTE — Progress Notes (Signed)
NAME:  Sean Serrano, MRN:  [id number], DOB:  [birth year], LOS: 2 ADMISSION DATE:  10/25/2021, CONSULTATION DATE:  10/25/21 REFERRING MD:  Stevie Kern - EM, CHIEF COMPLAINT:  Chest pain, syncope    History of Present Illness:  51 yo M PMH L4-L5 herniated disc with L4-L5 spinal stenosis and cauda equina who is POD 6 L4-L5 bilateral lumbar microdiscectomy (OR 10/19/21 [facility]). Post op course uncomplicated and he was discharged home 7/22.   Pt presented to experienced acute onset chest pain and felt like he was going to pass out to he sat down after walking in driveway.  Reports walking to mail box since surgery to get exercise.  He reports period of not remembering anything but then felt better, opted not to call 911, and called his wife to take him.  Had near syncopal episode in car, wife reports he was gray enroute to ER.  At baseline prior to May and his back issues, patient very active bicyclist and hiker.  He reports prior history of DVT after knee surgery treated with lovenox/ coumadin and again in LE but "wasn't the serious DVT" and treated with aspirin.  Also reports family history of blood clots in his father and sister.   Denied lower calf pain or swelling but did have new soreness/pain in left thigh today and mild shortness of breath.   In ER, he was noted to be tachycardic 120s and with initial room air saturations at 88% requiring 2LNC, and blood pressure normotensive. CTA chest obtained in ED which revealed saddle PE, evidence of R heart strain with RV:LV 1.62.  Labs pending.  Heparin gtt started.  Patient still complains of chest pressure.  EKG non acute.  PCCM consulted.   Pertinent  Medical History  Cauda equina, L4-L5 spinal stenosis and herniated nucleus pulposus  HLD ED Prior hx of DVT x2 (lovenox/ coumadin first- provoked after knee surgery, second on ASA only)  Significant Hospital Events: Including procedures, antibiotic start and stop dates in addition to other pertinent events    7/27 Saddle PE + syncope, tachycardia, mild hypoxia, Rv:LV on CTA chest 1.62 s/p thrombectomy by IR  Interim History / Subjective:   No acute events overnight Denies chest pain or shortness of breath Wife is at bedside. All questions answered.  Objective   Blood pressure 111/72, pulse 86, temperature 98.4 F (36.9 C), temperature source Oral, resp. rate 13, height 5\' 9"  (1.753 m), weight 85.8 kg, SpO2 97 %.        Intake/Output Summary (Last 24 hours) at 10/27/2021 0735 Last data filed at 10/27/2021 0600 Gross per 24 hour  Intake 1291.42 ml  Output 875 ml  Net 416.42 ml   Filed Weights   10/25/21 1403  Weight: 85.8 kg   Examination: General:  middle aged male, no acute distress, resting in bed HEENT: [place]/AT, moist mucous membranes, sclera anicteric Neuro: alert and oriented x 3, moving all extremities CV: tachycardic, no murmur PULM:  clear to auscultation, no wheezing GI: soft, non-tender, non-distended, BS+ Extremities: warm, no edema Skin: no rashes  Resolved Hospital Problem list    Assessment & Plan:   Massive Saddle Pulmonary Embolus with Syncope Hx of DVT and Family Hx of DVT/PE New Acute Left LLE DVT and Chronic RLE DVT - s/p thrombectomy by IR on 7/27 - continue heparin drip per pharmacy - Plan to transition to eliquis this evening after 48 hours on heparin drip.  - Follow up labs below incase coumadin will be better treatment  if he has antiphospholipid syndrome. - check anticardiolipin antibodies, lupus anticoagulant and anti-beta2 abs - given family hx of blood clots and prior DVT x 2, will need outpatient hematology evaluation in the future - Echo with low normal RV function, will need follow up echo in 3 months  Lumbar radiculopathy s/p L4/L5 discectomy on 7/21 by Dr. Danielle Dess - NSGY aware of patient  HLD - can continue lipitor 7/28 pending LFTs  Best Practice (right click and "Reselect all SmartList Selections" daily)   Diet/type: Regular  consistency (see orders) DVT prophylaxis: systemic heparin GI prophylaxis: N/A Lines: N/A Foley:  N/A Code Status:  full code Last date of multidisciplinary goals of care discussion [7/27]  Patient and wife at bedside updated on plan of care.   Labs   CBC: Recent Labs  Lab 10/25/21 1416 10/26/21 0057 10/27/21 0014  WBC 10.2 8.4 7.9  NEUTROABS 6.9  --   --   HGB 14.1 11.7* 11.6*  HCT 41.2 33.2* 32.8*  MCV 94.3 92.2 91.1  PLT 153 144* 158    Basic Metabolic Panel: Recent Labs  Lab 10/25/21 1416 10/26/21 0057  NA 143 139  K 4.1 3.7  CL 113* 111  CO2 22 22  GLUCOSE 105* 139*  BUN 20 19  CREATININE 1.20 1.02  CALCIUM 8.3* 7.8*   GFR: Estimated Creatinine Clearance: 90.9 mL/min (by C-G formula based on SCr of 1.02 mg/dL). Recent Labs  Lab 10/25/21 1416 10/25/21 1530 10/26/21 0057 10/27/21 0014  WBC 10.2  --  8.4 7.9  LATICACIDVEN  --  1.9  --   --     Liver Function Tests: Recent Labs  Lab 10/25/21 1416  AST 34  ALT 71*  ALKPHOS 52  BILITOT 0.8  PROT 6.1*  ALBUMIN 3.2*   No results for input(s): "LIPASE", "AMYLASE" in the last 168 hours. No results for input(s): "AMMONIA" in the last 168 hours.  ABG No results found for: "PHART", "PCO2ART", "PO2ART", "HCO3", "TCO2", "ACIDBASEDEF", "O2SAT"   Coagulation Profile: Recent Labs  Lab 10/25/21 1511  INR 1.1    Cardiac Enzymes: No results for input(s): "CKTOTAL", "CKMB", "CKMBINDEX", "TROPONINI" in the last 168 hours.  HbA1C: Hgb A1c MFr Bld  Date/Time Value Ref Range Status  12/22/2018 08:09 AM 5.1 <5.7 % of total Hgb Final    Comment:    For the purpose of screening for the presence of diabetes: . <5.7%       Consistent with the absence of diabetes 5.7-6.4%    Consistent with increased risk for diabetes             (prediabetes) > or =6.5%  Consistent with diabetes . This assay result is consistent with a decreased risk of diabetes. . Currently, no consensus exists regarding use  of hemoglobin A1c for diagnosis of diabetes in children. . According to American Diabetes Association (ADA) guidelines, hemoglobin A1c <7.0% represents optimal control in non-pregnant diabetic patients. Different metrics may apply to specific patient populations.  Standards of Medical Care in Diabetes(ADA). Marland Kitchen   07/13/2018 08:32 AM 5.3 <5.7 % of total Hgb Final    Comment:    For the purpose of screening for the presence of diabetes: . <5.7%       Consistent with the absence of diabetes 5.7-6.4%    Consistent with increased risk for diabetes             (prediabetes) > or =6.5%  Consistent with diabetes . This assay result is consistent with a  decreased risk of diabetes. . Currently, no consensus exists regarding use of hemoglobin A1c for diagnosis of diabetes in children. . According to American Diabetes Association (ADA) guidelines, hemoglobin A1c <7.0% represents optimal control in non-pregnant diabetic patients. Different metrics may apply to specific patient populations.  Standards of Medical Care in Diabetes(ADA). .     CBG: Recent Labs  Lab 10/25/21 2316 10/26/21 0319 10/26/21 0744 10/26/21 1134 10/26/21 1539  GLUCAP 146* 111* 111* 101* 98     Critical care time:  n/a     Melody Comas, MD [facility] Office: [phone]   See Amion for personal pager PCCM on call pager [phone] until 7pm. Please call Elink 7p-7a. [phone]

## 2021-10-27 NOTE — Progress Notes (Signed)
Pt admitted to 6E AxOx4, VS as per flow. Pt oriented to 6E processes. Pt familiar with the [facility] system. NSR on telemetry. Heparin gtt infusing at 1500 Units/hr. To be dc'd at 5pm and switched over to Eliquis. All questions and concerns addressed. Call bell placed within reach, will continue to monitor and maintain safety.

## 2021-10-28 LAB — HEXAGONAL PHASE PHOSPHOLIPID: Hexagonal Phase Phospholipid: 7 s (ref 0–11)

## 2021-10-28 LAB — BETA-2-GLYCOPROTEIN I ABS, IGG/M/A
Beta-2 Glyco I IgG: <9 GPI IgG units (ref 0–20)
Beta-2-Glycoprotein I IgA: 36 GPI IgA units — ABNORMAL HIGH (ref 0–25)
Beta-2-Glycoprotein I IgM: <9 GPI IgM units (ref 0–32)

## 2021-10-28 LAB — CBC
HCT: 32.7 % — ABNORMAL LOW (ref 39.0–52.0)
Hemoglobin: 11.5 g/dL — ABNORMAL LOW (ref 13.0–17.0)
MCH: 32.1 pg (ref 26.0–34.0)
MCHC: 35.2 g/dL (ref 30.0–36.0)
MCV: 91.3 fL (ref 80.0–100.0)
Platelets: 165 10*3/uL (ref 150–400)
RBC: 3.58 MIL/uL — ABNORMAL LOW (ref 4.22–5.81)
RDW: 12 % (ref 11.5–15.5)
WBC: 7.3 10*3/uL (ref 4.0–10.5)
nRBC: 0 % (ref 0.0–0.2)

## 2021-10-28 LAB — LUPUS ANTICOAGULANT PANEL
DRVVT: 46.1 s (ref 0.0–47.0)
PTT Lupus Anticoagulant: 45.2 s — ABNORMAL HIGH (ref 0.0–43.5)

## 2021-10-28 LAB — PTT-LA MIX: PTT-LA Mix: 42.9 s — ABNORMAL HIGH (ref 0.0–40.5)

## 2021-10-28 MED ORDER — APIXABAN 5 MG PO TABS: 5.0000 mg | ORAL_TABLET | Freq: Two times a day (BID) | ORAL | 6 refills | Status: DC

## 2021-10-28 MED ORDER — APIXABAN 5 MG PO TABS
10.0000 mg | ORAL_TABLET | Freq: Two times a day (BID) | ORAL | 0 refills | Status: DC
Start: 2021-10-28 — End: 2021-11-16

## 2021-10-28 NOTE — Progress Notes (Signed)
Patient is s/p PE thrombectomy on Thursday 7/27.   Reached out by RN, patient still has a suture on his right groin puncture site.   Patient seen at bedside, sitting in bed NAD, spouse at bedside.   Suture was removed w/o difficulty, mild hardening under the right CFV puncture site along with mild TTP. Hardening non-pulsatile, most likely mild hematoma. Patient was informed that it should resolve with time.   OK to take shower today but  keep the right groin site clean and dry for couple more days,  no submerging (swimming, sitting ia a bathtub) until next Thursday.  Patient and spouse verbalized understanding.   Please call IR for questions and concerns.   Lynann Bologna Lashelle Koy PA-C 10/28/2021 11:11 AM

## 2021-10-29 ENCOUNTER — Emergency Department
Admission: EM | Admit: 2021-10-29 | Discharge: 2021-10-29 | Disposition: A | Discharge status: Home / Self Care | Payer: BC Managed Care – PPO | Attending: Family Medicine | Admitting: Family Medicine

## 2021-10-29 DIAGNOSIS — L089 Local infection of the skin and subcutaneous tissue, unspecified: Secondary | ICD-10-CM | POA: Diagnosis not present

## 2021-10-29 DIAGNOSIS — B9689 Other specified bacterial agents as the cause of diseases classified elsewhere: Secondary | ICD-10-CM

## 2021-10-29 MED ORDER — MUPIROCIN 2 % EX OINT: 1.0000 | TOPICAL_OINTMENT | Freq: Two times a day (BID) | CUTANEOUS | 0 refills | Status: DC

## 2021-10-29 NOTE — ED Triage Notes (Signed)
Pt presents with c/o blister on rt hip that he noticed today. Pt states he was recently hospitalized for a PE.

## 2021-10-29 NOTE — Telephone Encounter (Signed)
Pt scheduled for 8/29 with [facility] for HFU.

## 2021-10-29 NOTE — ED Provider Notes (Signed)
[name]    CSN: [id number] Arrival date & time: 10/29/21  1626      History   Chief Complaint Chief Complaint  Patient presents with   Blister    HPI Sean Serrano is a 51 y.o. male.   HPI  Patient has some irritated skin with a blister on his right groin.  He had a hospitalization for pulmonary embolus and just was discharged yesterday.  It is near the area that was shaved in his groin for a catheterization procedure.  Moderately irritated. Patient has had shingles shots Patient is currently on Eliquis Patient had back surgery a couple weeks ago which is proposed why he developed the blood clot and PE  Past Medical History:  Diagnosis Date   ED (erectile dysfunction)    Hyperlipidemia    PONV (postoperative nausea and vomiting)     Patient Active Problem List   Diagnosis Date Noted   Pulmonary embolism (HCC) 10/25/2021   Herniated nucleus pulposus, L4-5 10/19/2021   Lumbar herniated disc 10/19/2021   Cauda equina syndrome (HCC) 10/04/2021   Skin lesion of face 02/05/2021   Bilateral wrist pain 12/11/2020   Hearing loss due to cerumen impaction, left 12/11/2020   Acute deep vein thrombosis (DVT) of calf muscle vein of right lower extremity (HCC) 04/13/2018   Erectile dysfunction 12/03/2016   Hyperlipidemia LDL goal <160 12/03/2016   Annual physical exam 09/14/2013   Obstructive uropathy 09/14/2013    Past Surgical History:  Procedure Laterality Date   ac separation     BACK SURGERY     IR ANGIOGRAM PULMONARY BILATERAL SELECTIVE  10/25/2021   IR ANGIOGRAM SELECTIVE EACH ADDITIONAL VESSEL  10/25/2021   IR ANGIOGRAM SELECTIVE EACH ADDITIONAL VESSEL  10/25/2021   IR THROMBECT PRIM MECH ADD (INCLU) MOD SED  10/25/2021   IR THROMBECT PRIM MECH INIT (INCLU) MOD SED  10/25/2021   IR US GUIDE VASC ACCESS RIGHT  10/25/2021   LUMBAR LAMINECTOMY/DECOMPRESSION MICRODISCECTOMY Bilateral 10/19/2021   Procedure: Bilateral Lumbar four-five  Microdiscectomy;   Surgeon: Barnett Abu, MD;  Location: [facility] OR;  Service: Neurosurgery;  Laterality: Bilateral;   SHOULDER SURGERY         Home Medications    Prior to Admission medications   Medication Sig Start Date End Date Taking? Authorizing Provider  mupirocin ointment (BACTROBAN) 2 % Apply 1 Application topically 2 (two) times daily. 10/29/21  Yes Eustace Moore, MD  acetaminophen (TYLENOL) 500 MG tablet Take 1,000 mg by mouth daily as needed for headache.    [provider]  apixaban (ELIQUIS) 5 MG TABS tablet Take 2 tablets (10 mg total) by mouth 2 (two) times daily for 6 days. 10/28/21 11/03/21  Martina Sinner, MD  apixaban (ELIQUIS) 5 MG TABS tablet Take 1 tablet (5 mg total) by mouth 2 (two) times daily. 11/04/21   Martina Sinner, MD  atorvastatin (LIPITOR) 10 MG tablet Take 1 tablet (10 mg total) by mouth daily. 12/11/20   Monica Becton, MD  calcium carbonate (TUMS EX) 750 MG chewable tablet Chew 750 mg by mouth daily as needed for heartburn.    [provider]  diphenhydramine-acetaminophen (TYLENOL PM) 25-500 MG TABS tablet Take 2 tablets by mouth at bedtime as needed (pain, sleep).    [provider]  famotidine (PEPCID) 10 MG tablet Take 10 mg by mouth daily as needed for heartburn.    [provider]  HYDROcodone-acetaminophen (NORCO/VICODIN) 5-325 MG tablet Take 1-2 tablets by mouth  every 4 (four) hours as needed (pain). Patient taking differently: Take 1 tablet by mouth every 4 (four) hours as needed (pain). 10/20/21   Jadene Pierini, MD  methocarbamol (ROBAXIN) 500 MG tablet Take 1 tablet (500 mg total) by mouth every 6 (six) hours as needed for muscle spasms. 10/20/21   Jadene Pierini, MD  Soft Lens Products (REWETTING DROPS) SOLN Place 1 drop into both eyes daily. [employer] brand lubricating + rewetting eye drops    [provider]  tadalafil (CIALIS) 5 MG tablet Take 1 tablet (5 mg total) by mouth daily. 12/11/20    Monica Becton, MD    Family History Family History  Problem Relation Age of Onset   Healthy Mother     Social History Social History   Tobacco Use   Smoking status: Never   Smokeless tobacco: Never  Vaping Use   Vaping Use: Never used  Substance Use Topics   Alcohol use: Yes    Alcohol/week: 12.0 standard drinks of alcohol    Types: 12 Cans of beer per week    Comment: weekly   Drug use: No     Allergies   Patient has no known allergies.   Review of Systems Review of Systems  See HPI Physical Exam Triage Vital Signs ED Triage Vitals  Enc Vitals Group     BP 10/29/21 1633 (!) 152/91     Pulse Rate 10/29/21 1633 94     Resp 10/29/21 1633 16     Temp 10/29/21 1633 98.6 F (37 C)     Temp Source 10/29/21 1633 Oral     SpO2 10/29/21 1633 98 %     Weight --      Height --      Head Circumference --      Peak Flow --      Pain Score 10/29/21 1634 0     Pain Loc --      Pain Edu? --      Excl. in GC? --    No data found.  Updated Vital Signs BP (!) 152/91 (BP Location: Right Arm)   Pulse 94   Temp 98.6 F (37 C) (Oral)   Resp 16   SpO2 98%       Physical Exam Constitutional:      General: He is not in acute distress.    Appearance: He is well-developed.  HENT:     Head: Normocephalic and atraumatic.  Eyes:     Conjunctiva/sclera: Conjunctivae normal.     Pupils: Pupils are equal, round, and reactive to light.  Cardiovascular:     Rate and Rhythm: Normal rate.  Pulmonary:     Effort: Pulmonary effort is normal. No respiratory distress.  Abdominal:     General: There is no distension.     Palpations: Abdomen is soft.  Musculoskeletal:        General: Normal range of motion.     Cervical back: Normal range of motion.  Skin:    General: Skin is warm and dry.     Findings: Rash present.     Comments: In the right lateral groin just below the anterior superior iliac spine there is an area of erythema that measures 3 cm across.  In the  center there is a 1 cm deflated vesicle.  There is slight yellow eschar.  Neurological:     Mental Status: He is alert.      UC Treatments / Results  Labs (all  labs ordered are listed, but only abnormal results are displayed) Labs Reviewed - No data to display  EKG   Radiology No results found.  Procedures Procedures (including critical care time)  Medications Ordered in UC Medications - No data to display  Initial Impression / Assessment and Plan / UC Course  I have reviewed the triage vital signs and the nursing notes.  Pertinent labs & imaging results that were available during my care of the patient were reviewed by me and considered in my medical decision making (see chart for details).     Final Clinical Impressions(s) / UC Diagnoses   Final diagnoses:  Skin infection, bacterial     Discharge Instructions      Apply mupirocin ointment to area and rub in thoroughly 2 times a day  Your culture report will be available on MyChart  Call for questions   ED Prescriptions     Medication Sig Dispense Auth. Provider   mupirocin ointment (BACTROBAN) 2 % Apply 1 Application topically 2 (two) times daily. 22 g Eustace Moore, MD      PDMP not reviewed this encounter.   Eustace Moore, MD 10/29/21 1900

## 2021-10-29 NOTE — Discharge Instructions (Signed)
Apply mupirocin ointment to area and rub in thoroughly 2 times a day  Your culture report will be available on MyChart  Call for questions

## 2021-10-30 ENCOUNTER — Telehealth: Payer: Self-pay

## 2021-10-30 NOTE — Telephone Encounter (Signed)
Order placed for wound culture specimen.

## 2021-11-01 ENCOUNTER — Other Ambulatory Visit: Payer: Self-pay

## 2021-11-01 DIAGNOSIS — I82461 Acute embolism and thrombosis of right calf muscular vein: Secondary | ICD-10-CM

## 2021-11-02 ENCOUNTER — Telehealth: Payer: Self-pay | Admitting: Oncology

## 2021-11-02 NOTE — Telephone Encounter (Signed)
Scheduled appt per 8/3 referral. Pt is aware of appt date and time. Pt is aware to arrive 15 mins prior to appt time and to bring and updated insurance card. Pt is aware of appt location.   

## 2021-11-04 LAB — AEROBIC/ANAEROBIC CULTURE W GRAM STAIN (SURGICAL/DEEP WOUND): Gram Stain: NONE SEEN

## 2021-11-15 NOTE — Progress Notes (Unsigned)
[facility] Initial Visit:  Patient Care Team: Silverio Decamp, MD as PCP - General (Family Medicine)  CHIEF COMPLAINTS/PURPOSE OF CONSULTATION:  Oncology History   No history exists.    HISTORY OF PRESENTING ILLNESS: Sean Serrano 51 y.o. male is here because of a thromboembolic event which occurred in July 2023.  April 10 2018:  LE Doppler Positive for deep venous thrombosis in the right femoral, popliteal, and visualized calf veins.   This was done because patient had right calf pain   October 06 2021:  MRI lumbar spine revealed L4-5 severe spinal stenosis due to degeneration and herniation.  Moderate left foraminal narrowing.  Remote L1 compression fracture.  October 19 2021:  Underwent L4-5 discectomy. Operative and postop course reported as uncomplicated.  Preop symptoms were remarkably better.  SCD'w were placed perioperatively. Discharged home 10/20/21.  October 25 2021:  Presented to [facility] ED with acute onset chest pain, near syncope.  He also had soreness in left thigh.  In the ED was noted to be tachycardic in the 120s and with RA O2 sat 88%.  Required requiring 2LNC, and blood pressure normotensive. CT PA demonstrated saddle pulmonary embolism with CT evidence of right heart strain (RV/LV Ratio = 1.62) consistent with at least submassive (intermediate risk) PE. Labs were notable for Troponin 4536.  Cardiac ECHO showed normal RV size but function low normal.  Septum was flattened and D shaped consistent with PE.  Tricuspid regurgitation signal inadequate for assessing PA pressure.  A heparin gtt was started.   PCCM consulted.  Thrombectomy performed to remove large volume, bilateral PE including saddle embolism.  PAP 38 mm Hg consistent with acute pulmonary arterial hypertension.  Oxygenation and tachycardia immediately improved post procedure.   October 27 2021:  Transitioned from IV heparin to Eliquis. October 28 2021:  Discharged home.    November 16 2021:   [place].  Hematology Consult   Labs from hospitalization notable for the following:  Anticardiolipan Ab negative,  Beta 2 glycolipoprotein IgA 36 but IgG and IgM negative.  Lupus anticoagulant negative.  Past medical history notable for He reports prior history of RLE DVT after knee injury in 2007 that was managed with closed reduction.  This was treated with lovenox/ coumadin.  He has history of calf vein DVT  and  LE but "wasn't the serious DVT" and treated with aspirin.   Family history notable for blood clots in his father and sister.  Father died from PE at age 72.  Sister had DVT following foot surgery.  Patient not aware if either of them underwent a hypercoagulable state evaluation.      Social:  Married.  Does not smoke.  Has daughter age 11 who has ITP.    Review of Systems  Constitutional:  Negative for appetite change, chills, diaphoresis, fatigue and unexpected weight change.       Rare drenching night sweats  HENT:   Negative for mouth sores, nosebleeds and sore throat.   Eyes:  Negative for eye problems and icterus.  Respiratory:  Negative for chest tightness, cough, hemoptysis and shortness of breath.   Cardiovascular:  Negative for chest pain, leg swelling and palpitations.  Gastrointestinal:  Negative for abdominal distention, abdominal pain, blood in stool, constipation, diarrhea and nausea.  Genitourinary:  Positive for nocturia. Negative for dysuria and hematuria.   Musculoskeletal:  Negative for arthralgias, back pain and myalgias.  Hematological:  Negative for adenopathy. Does not bruise/bleed easily.  MEDICAL HISTORY: Past Medical History:  Diagnosis Date   ED (erectile dysfunction)    Hyperlipidemia    PONV (postoperative nausea and vomiting)     SURGICAL HISTORY: Past Surgical History:  Procedure Laterality Date   ac separation     BACK SURGERY     IR ANGIOGRAM PULMONARY BILATERAL SELECTIVE  10/25/2021   IR ANGIOGRAM SELECTIVE EACH ADDITIONAL  VESSEL  10/25/2021   IR ANGIOGRAM SELECTIVE EACH ADDITIONAL VESSEL  10/25/2021   IR THROMBECT PRIM MECH ADD (INCLU) MOD SED  10/25/2021   IR THROMBECT PRIM MECH INIT (INCLU) MOD SED  10/25/2021   IR US GUIDE VASC ACCESS RIGHT  10/25/2021   LUMBAR LAMINECTOMY/DECOMPRESSION MICRODISCECTOMY Bilateral 10/19/2021   Procedure: Bilateral Lumbar four-five  Microdiscectomy;  Surgeon: Barnett Abu, MD;  Location: [facility] OR;  Service: Neurosurgery;  Laterality: Bilateral;   SHOULDER SURGERY      SOCIAL HISTORY: Social History   Socioeconomic History   Marital status: Married    Spouse name: Not on file   Number of children: Not on file   Years of education: Not on file   Highest education level: Not on file  Occupational History   Not on file  Tobacco Use   Smoking status: Never   Smokeless tobacco: Never  Vaping Use   Vaping Use: Never used  Substance and Sexual Activity   Alcohol use: Yes    Alcohol/week: 12.0 standard drinks of alcohol    Types: 12 Cans of beer per week    Comment: weekly   Drug use: No   Sexual activity: Not on file  Other Topics Concern   Not on file  Social History Narrative   Not on file   Social Determinants of Health   Financial Resource Strain: Not on file  Food Insecurity: Not on file  Transportation Needs: Not on file  Physical Activity: Not on file  Stress: Not on file  Social Connections: Not on file  Intimate Partner Violence: Not on file    FAMILY HISTORY Family History  Problem Relation Age of Onset   Healthy Mother     ALLERGIES:  has No Known Allergies.  MEDICATIONS:  Current Outpatient Medications  Medication Sig Dispense Refill   acetaminophen (TYLENOL) 500 MG tablet Take 1,000 mg by mouth daily as needed for headache.     apixaban (ELIQUIS) 5 MG TABS tablet Take 2 tablets (10 mg total) by mouth 2 (two) times daily for 6 days. 24 tablet 0   apixaban (ELIQUIS) 5 MG TABS tablet Take 1 tablet (5 mg total) by mouth 2 (two) times daily. 60  tablet 6   atorvastatin (LIPITOR) 10 MG tablet Take 1 tablet (10 mg total) by mouth daily. 90 tablet 3   calcium carbonate (TUMS EX) 750 MG chewable tablet Chew 750 mg by mouth daily as needed for heartburn.     diphenhydramine-acetaminophen (TYLENOL PM) 25-500 MG TABS tablet Take 2 tablets by mouth at bedtime as needed (pain, sleep).     famotidine (PEPCID) 10 MG tablet Take 10 mg by mouth daily as needed for heartburn.     HYDROcodone-acetaminophen (NORCO/VICODIN) 5-325 MG tablet Take 1-2 tablets by mouth every 4 (four) hours as needed (pain). (Patient taking differently: Take 1 tablet by mouth every 4 (four) hours as needed (pain).) 30 tablet 0   methocarbamol (ROBAXIN) 500 MG tablet Take 1 tablet (500 mg total) by mouth every 6 (six) hours as needed for muscle spasms. 30 tablet 2   mupirocin ointment (BACTROBAN)  2 % Apply 1 Application topically 2 (two) times daily. 22 g 0   Soft Lens Products (REWETTING DROPS) SOLN Place 1 drop into both eyes daily. [employer] brand lubricating + rewetting eye drops     tadalafil (CIALIS) 5 MG tablet Take 1 tablet (5 mg total) by mouth daily. 30 tablet 11   No current facility-administered medications for this visit.    PHYSICAL EXAMINATION:  ECOG PERFORMANCE STATUS: 0 - Asymptomatic   There were no vitals filed for this visit.  There were no vitals filed for this visit.   Physical Exam Vitals and nursing note reviewed.  Constitutional:      General: He is not in acute distress.    Appearance: Normal appearance. He is not ill-appearing, toxic-appearing or diaphoretic.     Comments: Here with wife.  Looks well  Cardiovascular:     Rate and Rhythm: Normal rate and regular rhythm.     Heart sounds: No murmur heard.    No friction rub. No gallop.  Pulmonary:     Effort: Pulmonary effort is normal. No respiratory distress.     Breath sounds: Normal breath sounds. No wheezing, rhonchi or rales.  Chest:     Chest wall: No tenderness.   Musculoskeletal:        General: No swelling, tenderness, deformity or signs of injury. Normal range of motion.     Cervical back: Neck supple. No rigidity or tenderness.     Right lower leg: No edema.     Left lower leg: No edema.  Lymphadenopathy:     Cervical: No cervical adenopathy.  Skin:    General: Skin is warm and dry.     Findings: No bruising, erythema, lesion or rash.  Neurological:     General: No focal deficit present.     Mental Status: He is alert and oriented to person, place, and time.     Cranial Nerves: No cranial nerve deficit.     Motor: No weakness.     Gait: Gait normal.  Psychiatric:        Mood and Affect: Mood normal.        Behavior: Behavior normal.        Thought Content: Thought content normal.    LABORATORY DATA: I have personally reviewed the data as listed:  Telephone on 10/30/2021  Component Date Value Ref Range Status   Specimen Description 10/30/2021 WOUND   Final   Special Requests 10/30/2021 RIGHT HIP SKIN SCRAPING DR CONCERNED FOR MRSA   Final   Gram Stain 10/30/2021    Final                   Value:NO WBC SEEN NO ORGANISMS SEEN    Culture 10/30/2021    Final                   Value:RARE PROPIONIBACTERIUM ACNES Standardized susceptibility testing for this organism is not available. Performed at [facility], [place] [address] [place], [place] [address]    Report Status 10/30/2021 11/04/2021 FINAL   Final  Admission on 10/25/2021, Discharged on 10/28/2021  Component Date Value Ref Range Status   WBC 10/25/2021 10.2  4.0 - 10.5 K/uL Final   RBC 10/25/2021 4.37  4.22 - 5.81 MIL/uL Final   Hemoglobin 10/25/2021 14.1  13.0 - 17.0 g/dL Final   HCT 10/25/2021 41.2  39.0 - 52.0 % Final   MCV 10/25/2021 94.3  80.0 - 100.0 fL Final  MCH 10/25/2021 32.3  26.0 - 34.0 pg Final   MCHC 10/25/2021 34.2  30.0 - 36.0 g/dL Final   RDW 10/25/2021 12.2  11.5 - 15.5 % Final   Platelets 10/25/2021 153  150 - 400 K/uL Final   nRBC 10/25/2021 0.0   0.0 - 0.2 % Final   Neutrophils Relative % 10/25/2021 68  % Final   Neutro Abs 10/25/2021 6.9  1.7 - 7.7 K/uL Final   Lymphocytes Relative 10/25/2021 19  % Final   Lymphs Abs 10/25/2021 2.0  0.7 - 4.0 K/uL Final   Monocytes Relative 10/25/2021 9  % Final   Monocytes Absolute 10/25/2021 0.9  0.1 - 1.0 K/uL Final   Eosinophils Relative 10/25/2021 3  % Final   Eosinophils Absolute 10/25/2021 0.4  0.0 - 0.5 K/uL Final   Basophils Relative 10/25/2021 0  % Final   Basophils Absolute 10/25/2021 0.0  0.0 - 0.1 K/uL Final   Immature Granulocytes 10/25/2021 1  % Final   Abs Immature Granulocytes 10/25/2021 0.09 (H)  0.00 - 0.07 K/uL Final   Performed at [facility], [place] [address] [place], [place] [address]   Sodium 10/25/2021 143  135 - 145 mmol/L Final   Potassium 10/25/2021 4.1  3.5 - 5.1 mmol/L Final   Chloride 10/25/2021 113 (H)  98 - 111 mmol/L Final   CO2 10/25/2021 22  22 - 32 mmol/L Final   Glucose, Bld 10/25/2021 105 (H)  70 - 99 mg/dL Final   Glucose reference range applies only to samples taken after fasting for at least 8 hours.   BUN 10/25/2021 20  6 - 20 mg/dL Final   Creatinine, Ser 10/25/2021 1.20  0.61 - 1.24 mg/dL Final   Calcium 10/25/2021 8.3 (L)  8.9 - 10.3 mg/dL Final   Total Protein 10/25/2021 6.1 (L)  6.5 - 8.1 g/dL Final   Albumin 10/25/2021 3.2 (L)  3.5 - 5.0 g/dL Final   AST 10/25/2021 34  15 - 41 U/L Final   ALT 10/25/2021 71 (H)  0 - 44 U/L Final   Alkaline Phosphatase 10/25/2021 52  38 - 126 U/L Final   Total Bilirubin 10/25/2021 0.8  0.3 - 1.2 mg/dL Final   GFR, Estimated 10/25/2021 >60  >60 mL/min Final   Comment: (NOTE) Calculated using the CKD-EPI Creatinine Equation (2021)    Anion gap 10/25/2021 8  5 - 15 Final   Performed at [place] [address], [place], [place] [address]   Troponin I (High Sensitivity) 10/25/2021 610 (HH)  <18 ng/L Final   Comment: CRITICAL RESULT CALLED TO, READ BACK BY AND VERIFIED WITH D.CAIN,RN @1547   10/25/2021 VANG.J (NOTE) Elevated high sensitivity troponin I (hsTnI) values and significant  changes across serial measurements may suggest ACS but many other  chronic and acute conditions are known to elevate hsTnI results.  Refer to the "Links" section for chest pain algorithms and additional  guidance. Performed at [facility], [place] [address] [place], [place] [address]    B Natriuretic Peptide 10/25/2021 6.0  0.0 - 100.0 pg/mL Final   Performed at [place] [address], [place], [place] [address]   Prothrombin Time 10/25/2021 14.4  11.4 - 15.2 seconds Final   INR 10/25/2021 1.1  0.8 - 1.2 Final   Comment: (NOTE) INR goal varies based on device and disease states. Performed at [facility], [place] [address] [place], [place] [address]    Heparin Unfractionated  10/25/2021 0.77 (H)  0.30 - 0.70 IU/mL Final   Comment: (NOTE) The clinical reportable range upper limit is being lowered to >1.10 to align with the FDA approved guidance for the current laboratory assay.  If heparin results are below expected values, and patient dosage has  been confirmed, suggest follow up testing of antithrombin III levels. Performed at [facility], [place] [address], [place], [place] [address]    Lactic Acid, Venous 10/25/2021 1.9  0.5 - 1.9 mmol/L Final   Performed at [place] [address] [place], [place] [address]   Weight 10/26/2021 3,026.47  oz Final   Height 10/26/2021 69  in Final   BP 10/26/2021 141/94  mmHg Final   S' Lateral 10/26/2021 3.00  cm Final   Area-P 1/2 10/26/2021 4.49  cm2 Final   B Natriuretic Peptide 10/25/2021 26.4  0.0 - 100.0 pg/mL Final   Performed at [facility], [place] [address] [place], [place] [address]   HIV Screen 4th Generation wRfx 10/25/2021 Non Reactive  Non Reactive Final   Performed at [facility], [place] [address], [place], [place] [address]   Troponin I (High Sensitivity) 10/25/2021 4,536 (HH)  <18 ng/L  Final   Comment: CRITICAL VALUE NOTED.  VALUE IS CONSISTENT WITH PREVIOUSLY REPORTED AND CALLED VALUE. (NOTE) Elevated high sensitivity troponin I (hsTnI) values and significant  changes across serial measurements may suggest ACS but many other  chronic and acute conditions are known to elevate hsTnI results.  Refer to the "Links" section for chest pain algorithms and additional  guidance. Performed at [facility], [place] [address], [place], [place] [address]    Heparin Unfractionated 10/26/2021 0.60  0.30 - 0.70 IU/mL Final   Comment: (NOTE) The clinical reportable range upper limit is being lowered to >1.10 to align with the FDA approved guidance for the current laboratory assay.  If heparin results are below expected values, and patient dosage has  been confirmed, suggest follow up testing of antithrombin III levels. Performed at [facility], [place] [address] [place], [place] [address]    WBC 10/26/2021 8.4  4.0 - 10.5 K/uL Final   RBC 10/26/2021 3.60 (L)  4.22 - 5.81 MIL/uL Final   Hemoglobin 10/26/2021 11.7 (L)  13.0 - 17.0 g/dL Final   HCT 10/26/2021 33.2 (L)  39.0 - 52.0 % Final   MCV 10/26/2021 92.2  80.0 - 100.0 fL Final   MCH 10/26/2021 32.5  26.0 - 34.0 pg Final   MCHC 10/26/2021 35.2  30.0 - 36.0 g/dL Final   RDW 10/26/2021 12.4  11.5 - 15.5 % Final   Platelets 10/26/2021 144 (L)  150 - 400 K/uL Final   nRBC 10/26/2021 0.0  0.0 - 0.2 % Final   Performed at [facility], [place] [address], [place], [place] [address]   Sodium 10/26/2021 139  135 - 145 mmol/L Final   Potassium 10/26/2021 3.7  3.5 - 5.1 mmol/L Final   Chloride 10/26/2021 111  98 - 111 mmol/L Final   CO2 10/26/2021 22  22 - 32 mmol/L Final   Glucose, Bld 10/26/2021 139 (H)  70 - 99 mg/dL Final   Glucose reference range applies only to samples taken after fasting for at least 8 hours.   BUN 10/26/2021 19  6 - 20 mg/dL Final   Creatinine, Ser 10/26/2021 1.02  0.61 - 1.24 mg/dL Final    Calcium 10/26/2021 7.8 (L)  8.9 - 10.3 mg/dL Final   GFR, Estimated 10/26/2021 >60  >60 mL/min Final   Comment: (NOTE) Calculated using the CKD-EPI Creatinine Equation (2021)    Anion gap 10/26/2021 6  5 - 15 Final   Performed at [facility], [place] [address] [place], [place] [address]   Activated Clotting Time 10/25/2021 149  seconds Final   Reference range 74-137 seconds for patients not on anticoagulant therapy.   Activated Clotting Time 10/25/2021 185  seconds Final   Reference range 74-137 seconds for patients not on anticoagulant therapy.   MRSA by PCR Next Gen 10/25/2021 NOT DETECTED  NOT DETECTED Final   Comment: (NOTE) The GeneXpert MRSA Assay (FDA approved for NASAL specimens only), is one component of a comprehensive MRSA colonization surveillance program. It is not intended to diagnose MRSA infection nor to guide or monitor treatment for MRSA infections. Test performance is not FDA approved in patients less than 39 years old. Performed at [facility], [place] [address], [place], [place] [address]    Glucose-Capillary 10/25/2021 165 (H)  70 - 99 mg/dL Final   Glucose reference range applies only to samples taken after fasting for at least 8 hours.   Glucose-Capillary 10/25/2021 146 (H)  70 - 99 mg/dL Final   Glucose reference range applies only to samples taken after fasting for at least 8 hours.   Heparin Unfractionated 10/26/2021 0.44  0.30 - 0.70 IU/mL Final   Comment: (NOTE) The clinical reportable range upper limit is being lowered to >1.10 to align with the FDA approved guidance for the current laboratory assay.  If heparin results are below expected values, and patient dosage has  been confirmed, suggest follow up testing of antithrombin III levels. Performed at [facility], [place] [address] [place], [place] [address]    Glucose-Capillary 10/26/2021 111 (H)  70 - 99 mg/dL Final   Glucose reference range applies only to samples taken after fasting  for at least 8 hours.   Glucose-Capillary 10/26/2021 111 (H)  70 - 99 mg/dL Final   Glucose reference range applies only to samples taken after fasting for at least 8 hours.   Comment 1 10/26/2021 Notify RN   Final   Comment 2 10/26/2021 Document in Chart   Final   Anticardiolipin IgG 10/26/2021 <9  0 - 14 GPL U/mL Final   Comment: (NOTE)                          Negative:              <15                          Indeterminate:     15 - 20                          Low-Med Positive: >20 - 80                          High Positive:         >80    Anticardiolipin IgM 10/26/2021 <9  0 - 12 MPL U/mL Final   Comment: (NOTE)                          Negative:              <  13                          Indeterminate:     13 - 20                          Low-Med Positive: >20 - 80                          High Positive:         >80    Anticardiolipin IgA 10/26/2021 <9  0 - 11 APL U/mL Final   Comment: (NOTE)                          Negative:              <12                          Indeterminate:     12 - 20                          Low-Med Positive: >20 - 80                          High Positive:         >80 Performed At: [facility] [place], [place] [id number] Rush Farmer MD [id number]    Beta-2 Glyco I IgG 10/26/2021 <9  0 - 20 GPI IgG units Final   Comment: (NOTE) The reference interval reflects a 3SD or 99th percentile interval, which is thought to represent a potentially clinically significant result in accordance with the International Consensus Statement on the classification criteria for definitive antiphospholipid syndrome (APS). J Thromb Haem 2006;4:295-306.    Beta-2-Glycoprotein I IgM 10/26/2021 <9  0 - 32 GPI IgM units Final   Comment: (NOTE) The reference interval reflects a 3SD or 99th percentile interval, which is thought to represent a potentially clinically significant result in accordance with the International Consensus Statement  on the classification criteria for definitive antiphospholipid syndrome (APS). J Thromb Haem 2006;4:295-306. Performed At: [facility] [place], [place] [id number] Rush Farmer MD [id number]    Beta-2-Glycoprotein I IgA 10/26/2021 36 (H)  0 - 25 GPI IgA units Final   Comment: (NOTE) The reference interval reflects a 3SD or 99th percentile interval, which is thought to represent a potentially clinically significant result in accordance with the International Consensus Statement on the classification criteria for definitive antiphospholipid syndrome (APS). J Thromb Haem 2006;4:295-306.    PTT Lupus Anticoagulant 10/26/2021 45.2 (H)  0.0 - 43.5 sec Final   Comment: (NOTE) Additional testing confirms the presence of heparin in the test sample. Results obtained after heparin neutralization.    DRVVT 10/26/2021 46.1  0.0 - 47.0 sec Final   Lupus Anticoag Interp 10/26/2021 Comment:   Corrected   Comment: (NOTE) No lupus anticoagulant was detected. Results suggest the presence of an inhibitor.  The presence of heparin, which is a non-specific inhibitor, may cause this pattern of results. Since the PTT-LA was extended and the dRVVT was within normal limits, a specific inhibitor to factor VIII, IX, XI, or XII cannot be excluded. As antibody titers may fluctuate with time, repeat testing may be indicated and ideally should be  performed in the absence of anticoagulant therapy. Performed At: [facility] [address] [place], [place] [id number] Jolene Schimke MD [id number]    Glucose-Capillary 10/26/2021 101 (H)  70 - 99 mg/dL Final   Glucose reference range applies only to samples taken after fasting for at least 8 hours.   Comment 1 10/26/2021 Notify RN   Final   Comment 2 10/26/2021 Document in Chart   Final   Glucose-Capillary 10/26/2021 98  70 - 99 mg/dL Final   Glucose reference range applies only to samples taken after fasting for at  least 8 hours.   Comment 1 10/26/2021 Notify RN   Final   Comment 2 10/26/2021 Document in Chart   Final   WBC 10/27/2021 7.9  4.0 - 10.5 K/uL Final   RBC 10/27/2021 3.60 (L)  4.22 - 5.81 MIL/uL Final   Hemoglobin 10/27/2021 11.6 (L)  13.0 - 17.0 g/dL Final   HCT 87/56/4332 32.8 (L)  39.0 - 52.0 % Final   MCV 10/27/2021 91.1  80.0 - 100.0 fL Final   MCH 10/27/2021 32.2  26.0 - 34.0 pg Final   MCHC 10/27/2021 35.4  30.0 - 36.0 g/dL Final   RDW 95/18/8416 12.1  11.5 - 15.5 % Final   Platelets 10/27/2021 158  150 - 400 K/uL Final   nRBC 10/27/2021 0.0  0.0 - 0.2 % Final   Performed at [facility], [address] [place], [place] [address]   Heparin Unfractionated 10/27/2021 0.33  0.30 - 0.70 IU/mL Final   Comment: (NOTE) The clinical reportable range upper limit is being lowered to >1.10 to align with the FDA approved guidance for the current laboratory assay.  If heparin results are below expected values, and patient dosage has  been confirmed, suggest follow up testing of antithrombin III levels. Performed at [facility], [address], [place], [place] [address]    WBC 10/28/2021 7.3  4.0 - 10.5 K/uL Final   RBC 10/28/2021 3.58 (L)  4.22 - 5.81 MIL/uL Final   Hemoglobin 10/28/2021 11.5 (L)  13.0 - 17.0 g/dL Final   HCT 93/23/5573 32.7 (L)  39.0 - 52.0 % Final   MCV 10/28/2021 91.3  80.0 - 100.0 fL Final   MCH 10/28/2021 32.1  26.0 - 34.0 pg Final   MCHC 10/28/2021 35.2  30.0 - 36.0 g/dL Final   RDW 22/05/5425 12.0  11.5 - 15.5 % Final   Platelets 10/28/2021 165  150 - 400 K/uL Final   nRBC 10/28/2021 0.0  0.0 - 0.2 % Final   Performed at [facility], [address], [place], [place] [address]   PTT-LA Mix 10/26/2021 42.9 (H)  0.0 - 40.5 sec Final   Comment: (NOTE) Performed At: [facility] [address] [place], [place] [id number] Jolene Schimke MD [id number]    Hexagonal Phase Phospholipid 10/26/2021 7  0 - 11 sec Final   Comment:  (NOTE) Performed At: [facility] [address] [place], [place] [id number] Jolene Schimke MD [id number]   Admission on 10/19/2021, Discharged on 10/20/2021  Component Date Value Ref Range Status   MRSA, PCR 10/19/2021 INVALID, UNABLE TO DETERMINE THE PRESENCE OF TARGET DUE TO SPECIMEN INTEGRITY. RECOLLECTION REQUESTED. (A)  NEGATIVE Final   EMAILED L. FORTE [id number] @2112  FH   Staphylococcus aureus 10/19/2021 INVALID, UNABLE TO DETERMINE THE PRESENCE OF TARGET DUE TO SPECIMEN INTEGRITY. RECOLLECTION REQUESTED. (A)  NEGATIVE Final   Comment: (NOTE) The Xpert SA Assay (FDA approved  for NASAL specimens in patients 77 years of age and older), is one component of a comprehensive surveillance program. It is not intended to diagnose infection nor to guide or monitor treatment. Performed at [facility], [address] [place], [place] [address]      ASSESSMENT/ PLAN 51 y.o. male with history of recurrent thromboembolic events the latest of which occurred in July 2023.  VTE following elective spine surgery:  A review of the orthopedic literature indicates that the  reported incidence of VTE in patients undergoing spine surgery range from 0.29% - 31%1-3. Moreover, the overall rates of pulmonary (PE) and associated fatality after spinal surgery are 1.38% and 0.34%, respectively.  With regard to chemoprophylaxis the risk of VTE is balanced against the risk of epidural hematoma.  There remains controversy regarding the use of routine screening for DVT in the perioperative period for patients undergoing spine procedures.  Based on the available literature, the risk factors for an increased risk of VTE in patients undergoing spine surgery may be seen in older patients, long periods of bedrest from paralysis and pain, high D-dimer level, longer duration of operation, intraoperative blood loss and transfusion, previous history of VTE, fracture, comorbid disease burden and tumor surgery.  Of  all the listed risk factors patient's only one was prior history of VTE thus suggesting the possibility of a hypercoagulable state.     Hypercoagulable state evaluation:  This is reasonable given the history of VTE following events which do not usually precipitate an event.  Patient also has a strong family history suggesting an inherited disorder.  Evaluation thus far consisted of testing for antiphospholipid antibodies.  This was notable for a positive Anti-beta2 glycolipoprotein however its association with VTA is weak.  Will test for FV Leiden and Prothrombin mutations.  Protein C, S and ATIII will be affected by Apixiban.  I will draw a Fibrinogen level.  Therapeutics:  Recommend continuing full dose Apixiban for 6 months and following D dimer levels.  Would then consider decreasing to prophylactic dose based on clinical condition and D dimer results.  Anticoagulation should be life long   Cancer Staging  No matching staging information was found for the patient.   No problem-specific Assessment & Plan notes found for this encounter.   No orders of the defined types were placed in this encounter.   All questions were answered. The patient knows to call the clinic with any problems, questions or concerns.  This note was electronically signed.    Barbee Cough, MD  11/15/2021 9:05 AM

## 2021-11-16 ENCOUNTER — Other Ambulatory Visit: Payer: Self-pay

## 2021-11-16 ENCOUNTER — Inpatient Hospital Stay: Payer: BC Managed Care – PPO | Attending: Oncology | Admitting: Oncology

## 2021-11-16 ENCOUNTER — Inpatient Hospital Stay: Payer: BC Managed Care – PPO

## 2021-11-16 VITALS — BP 129/89 | HR 63 | Temp 98.1°F | Resp 17 | Wt 189.6 lb

## 2021-11-16 DIAGNOSIS — M069 Rheumatoid arthritis, unspecified: Secondary | ICD-10-CM | POA: Insufficient documentation

## 2021-11-16 DIAGNOSIS — M48061 Spinal stenosis, lumbar region without neurogenic claudication: Secondary | ICD-10-CM | POA: Diagnosis not present

## 2021-11-16 DIAGNOSIS — I2692 Saddle embolus of pulmonary artery without acute cor pulmonale: Secondary | ICD-10-CM | POA: Diagnosis not present

## 2021-11-16 DIAGNOSIS — Z832 Family history of diseases of the blood and blood-forming organs and certain disorders involving the immune mechanism: Secondary | ICD-10-CM | POA: Diagnosis not present

## 2021-11-16 DIAGNOSIS — I2721 Secondary pulmonary arterial hypertension: Secondary | ICD-10-CM | POA: Insufficient documentation

## 2021-11-16 DIAGNOSIS — Z7289 Other problems related to lifestyle: Secondary | ICD-10-CM | POA: Diagnosis not present

## 2021-11-16 DIAGNOSIS — I82461 Acute embolism and thrombosis of right calf muscular vein: Secondary | ICD-10-CM

## 2021-11-16 DIAGNOSIS — I071 Rheumatic tricuspid insufficiency: Secondary | ICD-10-CM | POA: Insufficient documentation

## 2021-11-16 DIAGNOSIS — R351 Nocturia: Secondary | ICD-10-CM | POA: Insufficient documentation

## 2021-11-16 DIAGNOSIS — D6859 Other primary thrombophilia: Secondary | ICD-10-CM

## 2021-11-16 DIAGNOSIS — Z86718 Personal history of other venous thrombosis and embolism: Secondary | ICD-10-CM | POA: Insufficient documentation

## 2021-11-16 DIAGNOSIS — I2602 Saddle embolus of pulmonary artery with acute cor pulmonale: Secondary | ICD-10-CM

## 2021-11-16 DIAGNOSIS — Z8249 Family history of ischemic heart disease and other diseases of the circulatory system: Secondary | ICD-10-CM

## 2021-11-16 DIAGNOSIS — Z7901 Long term (current) use of anticoagulants: Secondary | ICD-10-CM | POA: Insufficient documentation

## 2021-11-16 DIAGNOSIS — Z79899 Other long term (current) drug therapy: Secondary | ICD-10-CM | POA: Insufficient documentation

## 2021-11-16 LAB — FIBRINOGEN: Fibrinogen: 356 mg/dL (ref 210–475)

## 2021-11-16 LAB — D-DIMER, QUANTITATIVE: D-Dimer, Quant: 0.75 ug/mL-FEU — ABNORMAL HIGH (ref 0.00–0.50)

## 2021-11-16 LAB — PROTIME-INR
INR: 1.1 (ref 0.8–1.2)
Prothrombin Time: 14.2 seconds (ref 11.4–15.2)

## 2021-11-16 LAB — APTT: aPTT: 29 seconds (ref 24–36)

## 2021-11-19 ENCOUNTER — Telehealth: Payer: Self-pay | Admitting: Oncology

## 2021-11-19 NOTE — Telephone Encounter (Signed)
Scheduled appt per 8/18 los. Pt aware.

## 2021-11-20 LAB — FACTOR 5 LEIDEN

## 2021-11-21 LAB — PROTHROMBIN GENE MUTATION

## 2021-11-22 LAB — THROMBIN TIME: Thrombin Time: 17.5 s (ref 0.0–23.0)

## 2021-11-27 ENCOUNTER — Ambulatory Visit (INDEPENDENT_AMBULATORY_CARE_PROVIDER_SITE_OTHER): Payer: BC Managed Care – PPO | Admitting: Pulmonary Disease

## 2021-11-27 ENCOUNTER — Encounter: Payer: Self-pay | Admitting: Pulmonary Disease

## 2021-11-27 VITALS — BP 106/80 | HR 72 | Temp 98.1°F | Ht 69.0 in | Wt 188.0 lb

## 2021-11-27 DIAGNOSIS — I2602 Saddle embolus of pulmonary artery with acute cor pulmonale: Secondary | ICD-10-CM

## 2021-11-27 DIAGNOSIS — I82402 Acute embolism and thrombosis of unspecified deep veins of left lower extremity: Secondary | ICD-10-CM | POA: Diagnosis not present

## 2021-11-27 NOTE — Patient Instructions (Signed)
We will repeat an echocardiogram end of October and early November   Follow up mid November via video visit  Continue eliquis

## 2021-11-27 NOTE — Progress Notes (Signed)
Synopsis: Referred in August 2023 for DVT/PE  Subjective:   PATIENT ID: Sean Serrano GENDER: male DOB: 07/22/1970, MRN: [id number]  HPI  Chief Complaint  Patient presents with   Follow-up    Breathing is doing well. He is on Eliquis BID.    Sean Serrano is a 51 year old male, former smoker with history of multiple DVTs who was admitted 7/27 to 7/30 for pulmonary embolus and DVT after L4-L5 lumbar microdiscectomy on 7/21, comes to pulmonary clinic for hospital follow up.  He was noted to have saddle pulmonary embolus with right heart strain on CTA Chest. He had elevated troponin. He underwent mechanical thrombectomy by IR on 7/27. Echo post procedure on 7/28 showed low normal RV systolic function with normal size. There is flattening of the interventricular septum consistent with RV volume overload. He was treated with heparin for 48 hours after arrival to hospital and transitioned to eliquis.   He reports feeling fine since discharge. No symptoms at this time except for some loose bowel movements today and specks of blood on toilet paper. No gross blood in stool or dark stools. Denies any pain in his legs or swelling. He has been evalauted by hematology for hypercoaguable states.  Past Medical History:  Diagnosis Date   ED (erectile dysfunction)    Hyperlipidemia    PONV (postoperative nausea and vomiting)      Family History  Problem Relation Age of Onset   Healthy Mother      Social History   Socioeconomic History   Marital status: Married    Spouse name: Not on file   Number of children: Not on file   Years of education: Not on file   Highest education level: Not on file  Occupational History   Not on file  Tobacco Use   Smoking status: Former    Packs/day: 2.00    Years: 4.00    Total pack years: 8.00    Types: Cigarettes    Quit date: 04/01/1988    Years since quitting: 33.6   Smokeless tobacco: Never  Vaping Use   Vaping Use: Never used  Substance and  Sexual Activity   Alcohol use: Yes    Alcohol/week: 12.0 standard drinks of alcohol    Types: 12 Cans of beer per week    Comment: weekly   Drug use: No   Sexual activity: Not on file  Other Topics Concern   Not on file  Social History Narrative   Not on file   Social Determinants of Health   Financial Resource Strain: Not on file  Food Insecurity: Not on file  Transportation Needs: Not on file  Physical Activity: Not on file  Stress: Not on file  Social Connections: Not on file  Intimate Partner Violence: Not on file     No Known Allergies   Outpatient Medications Prior to Visit  Medication Sig Dispense Refill   acetaminophen (TYLENOL) 500 MG tablet Take 1,000 mg by mouth daily as needed for headache.     apixaban (ELIQUIS) 5 MG TABS tablet Take 1 tablet (5 mg total) by mouth 2 (two) times daily. 60 tablet 6   atorvastatin (LIPITOR) 10 MG tablet Take 1 tablet (10 mg total) by mouth daily. 90 tablet 3   calcium carbonate (TUMS EX) 750 MG chewable tablet Chew 750 mg by mouth daily as needed for heartburn.     diphenhydramine-acetaminophen (TYLENOL PM) 25-500 MG TABS tablet Take 2 tablets by mouth at bedtime as  needed (pain, sleep).     famotidine (PEPCID) 10 MG tablet Take 10 mg by mouth daily as needed for heartburn.     Soft Lens Products (REWETTING DROPS) SOLN Place 1 drop into both eyes daily. [employer] brand lubricating + rewetting eye drops     tadalafil (CIALIS) 5 MG tablet Take 1 tablet (5 mg total) by mouth daily. 30 tablet 11   No facility-administered medications prior to visit.    Review of Systems  Constitutional:  Negative for chills, fever, malaise/fatigue and weight loss.  HENT:  Negative for congestion, sinus pain and sore throat.   Eyes: Negative.   Respiratory:  Negative for cough, hemoptysis, sputum production, shortness of breath and wheezing.   Cardiovascular:  Negative for chest pain, palpitations, orthopnea, claudication and leg swelling.   Gastrointestinal:  Negative for abdominal pain, heartburn, nausea and vomiting.  Genitourinary: Negative.   Musculoskeletal:  Negative for joint pain and myalgias.  Skin:  Negative for rash.  Neurological:  Negative for weakness.  Endo/Heme/Allergies: Negative.   Psychiatric/Behavioral: Negative.        Objective:   Vitals:   11/27/21 1527  BP: 106/80  Pulse: 72  Temp: 98.1 F (36.7 C)  TempSrc: Oral  SpO2: 97%  Weight: 188 lb (85.3 kg)  Height: 5\' 9"  (1.753 m)     Physical Exam Constitutional:      General: He is not in acute distress. HENT:     Head: Normocephalic and atraumatic.  Eyes:     Extraocular Movements: Extraocular movements intact.     Conjunctiva/sclera: Conjunctivae normal.     Pupils: Pupils are equal, round, and reactive to light.  Cardiovascular:     Rate and Rhythm: Normal rate and regular rhythm.     Pulses: Normal pulses.     Heart sounds: Normal heart sounds. No murmur heard. Pulmonary:     Effort: Pulmonary effort is normal.     Breath sounds: Normal breath sounds.  Abdominal:     General: Bowel sounds are normal.     Palpations: Abdomen is soft.  Musculoskeletal:     Right lower leg: No edema.     Left lower leg: No edema.  Lymphadenopathy:     Cervical: No cervical adenopathy.  Skin:    General: Skin is warm and dry.  Neurological:     General: No focal deficit present.     Mental Status: He is alert.  Psychiatric:        Mood and Affect: Mood normal.        Behavior: Behavior normal.        Thought Content: Thought content normal.        Judgment: Judgment normal.       CBC    Component Value Date/Time   WBC 7.3 10/28/2021 0306   RBC 3.58 (L) 10/28/2021 0306   HGB 11.5 (L) 10/28/2021 0306   HCT 32.7 (L) 10/28/2021 0306   PLT 165 10/28/2021 0306   MCV 91.3 10/28/2021 0306   MCH 32.1 10/28/2021 0306   MCHC 35.2 10/28/2021 0306   RDW 12.0 10/28/2021 0306   LYMPHSABS 2.0 10/25/2021 1416   MONOABS 0.9 10/25/2021 1416    EOSABS 0.4 10/25/2021 1416   BASOSABS 0.0 10/25/2021 1416     Chest imaging: CTA Chest 10/25/21 Cardiovascular: Positive for acute PE extending into the left and right pulmonary arteries (saddle pulmonary embolism) as well as more distal lobar, segmental and subsegmental pulmonary arteries bilaterally. Evidence of right heart strain (RV/LV  Ratio = 1.62). No pericardial effusion. Borderline cardiomegaly.   Mediastinum/Nodes: No enlarged mediastinal, hilar, or axillary lymph nodes. Thyroid gland, trachea, and esophagus demonstrate no significant findings.   Lungs/Pleura: Lungs are clear. No pleural effusion or pneumothorax.  PFT:     No data to display          Labs:  Path:  Echo 10/26/21: LV EF 60-65%. D shaped LV. RV systolic function is low normal, normal size.   Heart Catheterization:  Assessment & Plan:   Acute saddle pulmonary embolism with acute cor pulmonale (HCC) - Plan: ECHOCARDIOGRAM COMPLETE  Deep vein thrombosis (DVT) of left lower extremity, unspecified chronicity, unspecified vein (HCC)  Discussion: Sean Serrano is a 51 year old male, former smoker with history of multiple DVTs who was admitted 7/27 to 7/30 for pulmonary embolus and DVT after L4-L5 lumbar microdiscectomy on 7/21, comes to pulmonary clinic for hospital follow up.  He is to continue eliquis twice daily. Hematology will manage dosing in the future an determine if he is suitable for prophylactic dosing. He is to monitor for blood in his urine or stools.   We will repeat echo in late October to evaluate his right heart.   Follow up in mid November.   Melody Comas, MD [facility] Office: [phone]   Current Outpatient Medications:    acetaminophen (TYLENOL) 500 MG tablet, Take 1,000 mg by mouth daily as needed for headache., Disp: , Rfl:    apixaban (ELIQUIS) 5 MG TABS tablet, Take 1 tablet (5 mg total) by mouth 2 (two) times daily., Disp: 60 tablet, Rfl:  6   atorvastatin (LIPITOR) 10 MG tablet, Take 1 tablet (10 mg total) by mouth daily., Disp: 90 tablet, Rfl: 3   calcium carbonate (TUMS EX) 750 MG chewable tablet, Chew 750 mg by mouth daily as needed for heartburn., Disp: , Rfl:    diphenhydramine-acetaminophen (TYLENOL PM) 25-500 MG TABS tablet, Take 2 tablets by mouth at bedtime as needed (pain, sleep)., Disp: , Rfl:    famotidine (PEPCID) 10 MG tablet, Take 10 mg by mouth daily as needed for heartburn., Disp: , Rfl:    Soft Lens Products (REWETTING DROPS) SOLN, Place 1 drop into both eyes daily. [employer] brand lubricating + rewetting eye drops, Disp: , Rfl:    tadalafil (CIALIS) 5 MG tablet, Take 1 tablet (5 mg total) by mouth daily., Disp: 30 tablet, Rfl: 11

## 2021-12-02 ENCOUNTER — Other Ambulatory Visit: Payer: Self-pay | Admitting: Sports Medicine

## 2021-12-02 DIAGNOSIS — I82461 Acute embolism and thrombosis of right calf muscular vein: Secondary | ICD-10-CM

## 2021-12-02 DIAGNOSIS — E785 Hyperlipidemia, unspecified: Secondary | ICD-10-CM

## 2021-12-10 NOTE — Progress Notes (Deleted)
[facility] Cancer Initial Visit:  Patient Care Team: Monica Becton, MD as PCP - General (Family Medicine)  CHIEF COMPLAINTS/PURPOSE OF CONSULTATION:  Oncology History   No history exists.    HISTORY OF PRESENTING ILLNESS: Sean Serrano 51 y.o. male is here because of a thromboembolic event which occurred in July 2023.  April 10 2018:  LE Doppler Positive for deep venous thrombosis in the right femoral, popliteal, and visualized calf veins.   This was done because patient had right calf pain   October 06 2021:  MRI lumbar spine revealed L4-5 severe spinal stenosis due to degeneration and herniation.  Moderate left foraminal narrowing.  Remote L1 compression fracture.  October 19 2021:  Underwent L4-5 discectomy. Operative and postop course reported as uncomplicated.  Preop symptoms were remarkably better.  SCD'w were placed perioperatively. Discharged home 10/20/21.  October 25 2021:  Presented to [facility] ED with acute onset chest pain, near syncope.  He also had soreness in left thigh.  In the ED was noted to be tachycardic in the 120s and with RA O2 sat 88%.  Required requiring 2LNC, and blood pressure normotensive. CT PA demonstrated saddle pulmonary embolism with CT evidence of right heart strain (RV/LV Ratio = 1.62) consistent with at least submassive (intermediate risk) PE. Labs were notable for Troponin 4536.  Cardiac ECHO showed normal RV size but function low normal.  Septum was flattened and D shaped consistent with PE.  Tricuspid regurgitation signal inadequate for assessing PA pressure.  A heparin gtt was started.   PCCM consulted.  Thrombectomy performed to remove large volume, bilateral PE including saddle embolism.  PAP 38 mm Hg consistent with acute pulmonary arterial hypertension.  Oxygenation and tachycardia immediately improved post procedure.   October 27 2021:  Transitioned from IV heparin to Eliquis. October 28 2021:  Discharged home.    November 16 2021:   Gideon.  Hematology Consult   Labs from hospitalization notable for the following:  Anticardiolipan Ab negative,  Beta 2 glycolipoprotein IgA 36 but IgG and IgM negative.  Lupus anticoagulant negative.  Past medical history notable for He reports prior history of RLE DVT after knee injury in 2007 that was managed with closed reduction.  This was treated with lovenox/ coumadin.  He has history of calf vein DVT  and  LE but "wasn't the serious DVT" and treated with aspirin.   Family history notable for blood clots in his father and sister.  Father died from PE at age 67.  Sister had DVT following foot surgery.  Patient not aware if either of them underwent a hypercoagulable state evaluation.      Social:  Married.  Does not smoke.  Has daughter age 46 who has ITP.   Testing for factor V Leiden and prothrombin mutations were negative INR 1.1 PTT 29 Thrombin time 17.5 D-dimer 0.75 (upper limit of normal 0.50)  December 13 2021:  Scheduled follow up for management of DVT   Review of Systems  Constitutional:  Negative for appetite change, chills, diaphoresis, fatigue and unexpected weight change.       Rare drenching night sweats  HENT:   Negative for mouth sores, nosebleeds and sore throat.   Eyes:  Negative for eye problems and icterus.  Respiratory:  Negative for chest tightness, cough, hemoptysis and shortness of breath.   Cardiovascular:  Negative for chest pain, leg swelling and palpitations.  Gastrointestinal:  Negative for abdominal distention, abdominal pain, blood in stool,  constipation, diarrhea and nausea.  Genitourinary:  Positive for nocturia. Negative for dysuria and hematuria.   Musculoskeletal:  Negative for arthralgias, back pain and myalgias.  Hematological:  Negative for adenopathy. Does not bruise/bleed easily.    MEDICAL HISTORY: Past Medical History:  Diagnosis Date   ED (erectile dysfunction)    Hyperlipidemia    PONV (postoperative nausea and vomiting)      SURGICAL HISTORY: Past Surgical History:  Procedure Laterality Date   ac separation     BACK SURGERY     IR ANGIOGRAM PULMONARY BILATERAL SELECTIVE  10/25/2021   IR ANGIOGRAM SELECTIVE EACH ADDITIONAL VESSEL  10/25/2021   IR ANGIOGRAM SELECTIVE EACH ADDITIONAL VESSEL  10/25/2021   IR THROMBECT PRIM MECH ADD (INCLU) MOD SED  10/25/2021   IR THROMBECT PRIM MECH INIT (INCLU) MOD SED  10/25/2021   IR US GUIDE VASC ACCESS RIGHT  10/25/2021   LUMBAR LAMINECTOMY/DECOMPRESSION MICRODISCECTOMY Bilateral 10/19/2021   Procedure: Bilateral Lumbar four-five  Microdiscectomy;  Surgeon: Barnett Abu, MD;  Location: [facility] OR;  Service: Neurosurgery;  Laterality: Bilateral;   SHOULDER SURGERY      SOCIAL HISTORY: Social History   Socioeconomic History   Marital status: Married    Spouse name: Not on file   Number of children: Not on file   Years of education: Not on file   Highest education level: Not on file  Occupational History   Not on file  Tobacco Use   Smoking status: Former    Packs/day: 2.00    Years: 4.00    Total pack years: 8.00    Types: Cigarettes    Quit date: 04/01/1988    Years since quitting: 33.7   Smokeless tobacco: Never  Vaping Use   Vaping Use: Never used  Substance and Sexual Activity   Alcohol use: Yes    Alcohol/week: 12.0 standard drinks of alcohol    Types: 12 Cans of beer per week    Comment: weekly   Drug use: No   Sexual activity: Not on file  Other Topics Concern   Not on file  Social History Narrative   Not on file   Social Determinants of Health   Financial Resource Strain: Not on file  Food Insecurity: Not on file  Transportation Needs: Not on file  Physical Activity: Not on file  Stress: Not on file  Social Connections: Not on file  Intimate Partner Violence: Not on file    FAMILY HISTORY Family History  Problem Relation Age of Onset   Healthy Mother     ALLERGIES:  has No Known Allergies.  MEDICATIONS:  Current Outpatient  Medications  Medication Sig Dispense Refill   acetaminophen (TYLENOL) 500 MG tablet Take 1,000 mg by mouth daily as needed for headache.     apixaban (ELIQUIS) 5 MG TABS tablet Take 1 tablet (5 mg total) by mouth 2 (two) times daily. 60 tablet 6   atorvastatin (LIPITOR) 10 MG tablet Take 1 tablet (10 mg total) by mouth daily. 90 tablet 3   calcium carbonate (TUMS EX) 750 MG chewable tablet Chew 750 mg by mouth daily as needed for heartburn.     diphenhydramine-acetaminophen (TYLENOL PM) 25-500 MG TABS tablet Take 2 tablets by mouth at bedtime as needed (pain, sleep).     famotidine (PEPCID) 10 MG tablet Take 10 mg by mouth daily as needed for heartburn.     Soft Lens Products (REWETTING DROPS) SOLN Place 1 drop into both eyes daily. [employer] brand lubricating + rewetting eye  drops     tadalafil (CIALIS) 5 MG tablet Take 1 tablet (5 mg total) by mouth daily. 30 tablet 11   No current facility-administered medications for this visit.    PHYSICAL EXAMINATION:  ECOG PERFORMANCE STATUS: 0 - Asymptomatic   There were no vitals filed for this visit.  There were no vitals filed for this visit.   Physical Exam Vitals and nursing note reviewed.  Constitutional:      General: He is not in acute distress.    Appearance: Normal appearance. He is not ill-appearing, toxic-appearing or diaphoretic.     Comments: Here with wife.  Looks well  Cardiovascular:     Rate and Rhythm: Normal rate and regular rhythm.     Heart sounds: No murmur heard.    No friction rub. No gallop.  Pulmonary:     Effort: Pulmonary effort is normal. No respiratory distress.     Breath sounds: Normal breath sounds. No wheezing, rhonchi or rales.  Chest:     Chest wall: No tenderness.  Musculoskeletal:        General: No swelling, tenderness, deformity or signs of injury. Normal range of motion.     Cervical back: Neck supple. No rigidity or tenderness.     Right lower leg: No edema.     Left lower leg: No edema.   Lymphadenopathy:     Cervical: No cervical adenopathy.  Skin:    General: Skin is warm and dry.     Findings: No bruising, erythema, lesion or rash.  Neurological:     General: No focal deficit present.     Mental Status: He is alert and oriented to person, place, and time.     Cranial Nerves: No cranial nerve deficit.     Motor: No weakness.     Gait: Gait normal.  Psychiatric:        Mood and Affect: Mood normal.        Behavior: Behavior normal.        Thought Content: Thought content normal.    LABORATORY DATA: I have personally reviewed the data as listed:  Office Visit on 11/16/2021  Component Date Value Ref Range Status   Recommendations-F5LEID: 11/16/2021 Comment   Final   Comment: (NOTE) Result: c.1601G>A (p.Arg534Gln) - Not Detected This result is not associated with an increased risk for venous thromboembolism. See Additional Clinical Information and Comments. Additional Clinical Information: Venous thromboembolism is a multifactorial disease influenced by genetic, environmental, and circumstantial risk factors. The c.1601G>A (p. Arg534Gln) variant in the F5 gene, commonly referred to as Factor V Leiden, is a genetic risk factor for venous thromboembolism. Heterozygous carriers of this variant have a 6- to 8- fold increased risk for venous thromboembolism. Individuals homozygous for this variant (ie, with a copy of the variant on each chromosome) have an approximately 80-fold increased risk for venous thromboembolism. Individuals who carry both a c.*97G>A variant in the F2 gene and Factor V Leiden have an approximately 20-fold increased risk for venous thromboembolism. Risks are likely to be even higher in more complex genotype combinations in                          volving the F2 c.*97G>A variant and Factor V Leiden (PMID: [id number]). Additional risk factors include but are not limited to: deficiency of protein C, protein S, or antithrombin III, age, male  sex, personal or family history of deep vein thromboembolism, smoking, surgery, prolonged immobilization, malignant  neoplasm, tamoxifen treatment, raloxifene treatment, oral contraceptive use, hormone replacement therapy, and pregnancy. Management of thrombotic risk and thrombotic events should follow established guidelines and fit the clinical circumstance. This result cannot predict the occurrence or recurrence of a thrombotic event. Comment: Genetic counseling is recommended to discuss the potential clinical implications of positive results, as well as recommendations for testing family members. Genetic Coordinators are available for health care providers to discuss results at [phone]). Test Details: Variant Analyzed: c.1601G>A (p. Arg534Gln), referred to as Fact                          or V Leiden Methods/Limitations: DNA analysis of the F5 gene (NM_000130.5) was performed by PCR amplification followed by restriction enzyme analysis. The diagnostic sensitivity is >99%. Results must be combined with clinical information for the most accurate interpretation. Molecular- based testing is highly accurate, but as in any laboratory test, diagnostic errors may occur. False positive or false negative results may occur for reasons that include genetic variants, blood transfusions, bone marrow transplantation, somatic or tissue-specific mosaicism, mislabeled samples, or erroneous representation of family relationships. This test was developed and its performance characteristics determined by [facility]. It has not been cleared or approved by the Food and Drug Administration. References: Jamse [place] [facility], Laurann Montana [facility]; [facility] Professional Practice and Guidelines Committee. Addendum: [place] consensus statement on fac                          tor V Leiden mutation testing. Genet Med. 2021 Mar 5. doi: [id number].  PMID: [id number]. Kristopher Oppenheim. Factor V Leiden Thrombophilia. 1999 May 14 (Updated 2018 Jan 4). In: Tarri Glenn, Ardinger HH, Pagon RA, et al., editors. GeneReviews(R) (Internet). [address] ([place]): [place] [employer] [place], [place]; 1993-2021. Available from: MortgageHole.[email or web address] Terrilee Files, Carla Drape, Marin Shutter CS; [facility] Laboratory Quality Assurance Committee. Venous thromboembolism laboratory testing (factor V Leiden and factor II c.*97G>A), 2018 update: a technical standard of the [place] ([facility]). Genet Med. 2018 Dec;20(12):1489-1498. doi: [id number]. Epub 2018 Oct 5. PMID: [id number].    Reviewed By: 11/16/2021 Comment   Final   Comment: (NOTE) Technical Component performed at [facility] Professional Component performed by: Earlean Polka, Ph.D., [facility] Director, Molecular Genetics [place] [address] Performed At: [facility] [address] [place], [place] [id number] Katina Degree MDPhD [id number]    Recommendations-PTGENE: 11/16/2021 Comment   Final   Comment: (NOTE) Result: c.*97G>A - Not Detected This result is not associated with an increased risk for venous thromboembolism. See Additional Clinical Information and Comments. Additional Clinical Information: Venous thromboembolism is a multifactorial disease influenced by genetic, environmental, and circumstantial risk factors. The c.*97G>A variant in the F2 gene is a genetic risk factor for venous thromboembolism. Heterozygous carriers have a 2- to 4-fold increased risk for venous thromboembolism. Homozygotes for the c.*97G>A variant are rare. The annual risk of VTE in homozygotes has been reported to be 1.1%/year. Individuals who carry both a c.*97G>A variant in the F2 gene and a c.1601G>A (p. Arg534Gln) variant in the F5 gene (commonly referred to as Factor V Leiden) have an approximately 20- fold  increased risk for venous thromboembolism. Risks are likely to be even higher in more complex genotype combinations involving the F2 c.*97G>A variant and Factor V Leiden (PMID:  [id number]). Additional risk factors include but are not limited to: deficiency of protein C, protein S, or antithrombin III, age, male sex, personal or family history of deep vein thromboembolism, smoking, surgery, prolonged immobilization, malignant neoplasm, tamoxifen treatment, raloxifene treatment, oral contraceptive use, hormone replacement therapy, and pregnancy. Management of thrombotic risk and thrombotic events should follow established guidelines and fit the clinical circumstance. This result cannot predict the occurrence or recurrence of a thrombotic event. Comments: Genetic counseling is recommended to discuss the potential clinical implications of positive results, as well as recommendations for testing family members. Genetic Coordinators are available for health care providers to discuss results at [phone]). Test Details: Variant analyzed: c.*97G>A, previously referred to as G20210A Methods/Limitations: DNA analysis of the F2 gene (NM_000                          506.5) was performed by PCR amplification followed by restriction enzyme analysis. The diagnostic sensitivity is >99%. Results must be combined with clinical information for the most accurate interpretation. Molecular-based testing is highly accurate, but as in any laboratory test, diagnostic errors may occur. False positive or false negative results may occur for reasons that include genetic variants, blood transfusions, bone marrow transplantation, somatic or tissue-specific mosaicism, mislabeled samples, or erroneous representation of family relationships. This test was developed and its performance characteristics determined by [facility]. It has not been cleared or approved by the Food and  Drug Administration. References: Jamse [facility], Laurann Montana [facility]; [facility] Professional Practice and Guidelines Committee. Addendum: [place] consensus statement on factor V Leiden mutation testing. Genet Med. 2021 Mar 5. doi: [id number]                          [id number]. PMID: [id number]. Kristopher Oppenheim. Prothrombin Thrombophilia. 2006 Jul 25 [Updated 2021 Feb 4]. In: Tarri Glenn, Ardinger HH, Pagon RA, et al., editors. GeneReviews(R) [Internet]. [address] ([place]): [place] [employer] [place], [place]; 1993-2021. Available from: [email or web address]-brown.com/ Terrilee Files, Carla Drape, Marin Shutter CS; [facility] Laboratory Quality Assurance Committee. Venous thromboembolism laboratory testing (factor V Leiden and factor II c.*97G>A), 2018 update: a technical standard of the [place] ([facility]). Genet Med. 2018 Dec;20(12):1489-1498. doi: [id number]. Epub 2018 Oct 5. PMID: IN:[id number].    Reviewed by: 11/16/2021 Comment   Final   Comment: (NOTE) Lubertha South, Ph.D., [facility] Performed At: [facility] [address] [place], [place] [id number] Katina Degree MDPhD [id number]    Thrombin Time 11/16/2021 17.5  0.0 - 23.0 sec Final   Comment: (NOTE) Performed At: [facility] [place], [place] [id number] Rush Farmer MD [id number]    D-Dimer, Quant 11/16/2021 0.75 (H)  0.00 - 0.50 ug/mL-FEU Final   Comment: (NOTE) At the manufacturer cut-off value of 0.5 g/mL FEU, this assay has a negative predictive value of 95-100%.This assay is intended for use in conjunction with a clinical pretest probability (PTP) assessment model to exclude pulmonary embolism (PE) and deep venous thrombosis (DVT) in outpatients suspected of PE or DVT. Results should be correlated with clinical presentation. Performed at [facility], [place]  [address], [place], [place] [address]    Fibrinogen 11/16/2021 356  210 - 475 mg/dL Final   Comment: (NOTE) Fibrinogen results may be underestimated in patients receiving thrombolytic therapy. Performed at [facility], [place]  [address] [place], [place] [address]    Prothrombin Time 11/16/2021 14.2  11.4 - 15.2 seconds Final   INR 11/16/2021 1.1  0.8 - 1.2 Final   Comment: (NOTE) INR goal varies based on device and disease states. Performed at [facility], [place] [address] [place], [place] [address]    aPTT 11/16/2021 29  24 - 36 seconds Final   Performed at [facility], [place] [address] [place], [place] [address]     ASSESSMENT/ PLAN 51 y.o. male with history of recurrent thromboembolic events the latest of which occurred in July 2023.  VTE following elective spine surgery:  A review of the orthopedic literature indicates that the  reported incidence of VTE in patients undergoing spine surgery range from 0.29% - 31%1-3. Moreover, the overall rates of pulmonary (PE) and associated fatality after spinal surgery are 1.38% and 0.34%, respectively.  With regard to chemoprophylaxis the risk of VTE is balanced against the risk of epidural hematoma.  There remains controversy regarding the use of routine screening for DVT in the perioperative period for patients undergoing spine procedures.  Based on the available literature, the risk factors for an increased risk of VTE in patients undergoing spine surgery may be seen in older patients, long periods of bedrest from paralysis and pain, high D-dimer level, longer duration of operation, intraoperative blood loss and transfusion, previous history of VTE, fracture, comorbid disease burden and tumor surgery.  Of all the listed risk factors patient's only one was prior history of VTE thus suggesting the possibility of a hypercoagulable state.     Hypercoagulable state evaluation:  This is reasonable  given the history of VTE following events which do not usually precipitate an event.  Patient also has a strong family history suggesting an inherited disorder.  Evaluation thus far consisted of testing for antiphospholipid antibodies.  This was notable for a positive Anti-beta2 glycolipoprotein however its association with VTA is weak.  Will test for FV Leiden and Prothrombin mutations.  Protein C, S and ATIII will be affected by Apixiban.  I will draw a Fibrinogen level.  Therapeutics:  Recommend continuing full dose Apixiban for 6 months and following D dimer levels.  Would then consider decreasing to prophylactic dose based on clinical condition and D dimer results.  Anticoagulation should be life long    Cancer Staging  No matching staging information was found for the patient.   No problem-specific Assessment & Plan notes found for this encounter.   No orders of the defined types were placed in this encounter.   All questions were answered. The patient knows to call the clinic with any problems, questions or concerns.  This note was electronically signed.    Barbee Cough, MD  12/10/2021 2:54 PM

## 2021-12-11 ENCOUNTER — Encounter: Payer: Self-pay | Admitting: Sports Medicine

## 2021-12-11 ENCOUNTER — Ambulatory Visit (INDEPENDENT_AMBULATORY_CARE_PROVIDER_SITE_OTHER): Payer: BC Managed Care – PPO | Admitting: Sports Medicine

## 2021-12-11 VITALS — BP 123/74 | HR 83 | Ht 69.0 in | Wt 193.0 lb

## 2021-12-11 DIAGNOSIS — N529 Male erectile dysfunction, unspecified: Secondary | ICD-10-CM

## 2021-12-11 DIAGNOSIS — G4700 Insomnia, unspecified: Secondary | ICD-10-CM | POA: Insufficient documentation

## 2021-12-11 DIAGNOSIS — F5101 Primary insomnia: Secondary | ICD-10-CM | POA: Diagnosis not present

## 2021-12-11 DIAGNOSIS — Z0000 Encounter for general adult medical examination without abnormal findings: Secondary | ICD-10-CM | POA: Diagnosis not present

## 2021-12-11 DIAGNOSIS — M25531 Pain in right wrist: Secondary | ICD-10-CM | POA: Diagnosis not present

## 2021-12-11 DIAGNOSIS — N139 Obstructive and reflux uropathy, unspecified: Secondary | ICD-10-CM | POA: Diagnosis not present

## 2021-12-11 DIAGNOSIS — M25532 Pain in left wrist: Secondary | ICD-10-CM

## 2021-12-11 DIAGNOSIS — E785 Hyperlipidemia, unspecified: Secondary | ICD-10-CM

## 2021-12-11 MED ORDER — TRAZODONE HCL 50 MG PO TABS: ORAL_TABLET | ORAL | 3 refills | Status: DC

## 2021-12-11 MED ORDER — TADALAFIL 5 MG PO TABS: 5.0000 mg | ORAL_TABLET | Freq: Every day | ORAL | 3 refills | Status: DC

## 2021-12-11 MED ORDER — DICLOFENAC SODIUM 1 % EX GEL: 2.0000 g | Freq: Four times a day (QID) | CUTANEOUS | 11 refills | Status: DC

## 2021-12-11 MED ORDER — ACETAMINOPHEN ER 650 MG PO TBCR: 650.0000 mg | EXTENDED_RELEASE_TABLET | Freq: Two times a day (BID) | ORAL | 3 refills | Status: AC

## 2021-12-11 NOTE — Assessment & Plan Note (Signed)
Annual physical exam. Up-to-date on screenings.

## 2021-12-11 NOTE — Assessment & Plan Note (Signed)
Continue Cialis, refilling. This does help to some degree with obstructive uropathy.

## 2021-12-11 NOTE — Assessment & Plan Note (Signed)
Alinda Money also complains of some insomnia, he typically eats dinner at about 530 and has 3 beers, he then will be in front of a screen until he goes to bed at 10:00 or 1030, sleep latency is very short but he does wake up several times through the night to void. I explained the importance of avoiding bright blue lights before night, we showed him how to use the bluelight filter on his cell phone. I also explained centralization of peripheral fluid and its contribution to filling of the bladder. I also explained how multiple beers in the evening acted as a diuretic, he will cut back to 1 beer with dinner, empty his bladder before bedtime, continued tadalafil. He was using Tylenol PM, he will discontinue this as there is some evidence of increased dementia with chronic use of antihistamines. We will switch to trazodone starting at 50 mg at night.  He understands not to start trazodone until he has tried lifestyle changes for 2 to 4 weeks.

## 2021-12-11 NOTE — Assessment & Plan Note (Signed)
Initially improved with tadalafil, he did get orthostasis with Flomax. We will check labs including a PSA, we also discussed his nocturia, there are some lifestyle changes he can make, see above.

## 2021-12-11 NOTE — Assessment & Plan Note (Signed)
Historically resolved with naproxen however he has had right lower extremity DVT and more recently a saddle pulmonary embolism, he will thus be on Eliquis lifelong, switching to arthritis strength Tylenol and topical Voltaren.

## 2021-12-11 NOTE — Progress Notes (Signed)
Subjective:    CC: Annual Physical Exam  HPI:  This patient is here for their annual physical  I reviewed the past medical history, family history, social history, surgical history, and allergies today and no changes were needed.  Please see the problem list section below in epic for further details.  Past Medical History: Past Medical History:  Diagnosis Date   ED (erectile dysfunction)    Hyperlipidemia    PONV (postoperative nausea and vomiting)    Past Surgical History: Past Surgical History:  Procedure Laterality Date   ac separation     BACK SURGERY     IR ANGIOGRAM PULMONARY BILATERAL SELECTIVE  10/25/2021   IR ANGIOGRAM SELECTIVE EACH ADDITIONAL VESSEL  10/25/2021   IR ANGIOGRAM SELECTIVE EACH ADDITIONAL VESSEL  10/25/2021   IR THROMBECT PRIM MECH ADD (INCLU) MOD SED  10/25/2021   IR THROMBECT PRIM MECH INIT (INCLU) MOD SED  10/25/2021   IR US GUIDE VASC ACCESS RIGHT  10/25/2021   LUMBAR LAMINECTOMY/DECOMPRESSION MICRODISCECTOMY Bilateral 10/19/2021   Procedure: Bilateral Lumbar four-five  Microdiscectomy;  Surgeon: Barnett Abu, MD;  Location: [facility];  Service: Neurosurgery;  Laterality: Bilateral;   SHOULDER SURGERY     Social History: Social History   Socioeconomic History   Marital status: Married    Spouse name: Not on file   Number of children: Not on file   Years of education: Not on file   Highest education level: Not on file  Occupational History   Not on file  Tobacco Use   Smoking status: Former    Packs/day: 2.00    Years: 4.00    Total pack years: 8.00    Types: Cigarettes    Quit date: 04/01/1988    Years since quitting: 33.7   Smokeless tobacco: Never  Vaping Use   Vaping Use: Never used  Substance and Sexual Activity   Alcohol use: Yes    Alcohol/week: 12.0 standard drinks of alcohol    Types: 12 Cans of beer per week    Comment: weekly   Drug use: No   Sexual activity: Not on file  Other Topics Concern   Not on file  Social History  Narrative   Not on file   Social Determinants of Health   Financial Resource Strain: Not on file  Food Insecurity: Not on file  Transportation Needs: Not on file  Physical Activity: Not on file  Stress: Not on file  Social Connections: Not on file   Family History: Family History  Problem Relation Age of Onset   Healthy Mother    Allergies: No Known Allergies Medications: See med rec.  Review of Systems: No headache, visual changes, nausea, vomiting, diarrhea, constipation, dizziness, abdominal pain, skin rash, fevers, chills, night sweats, swollen lymph nodes, weight loss, chest pain, body aches, joint swelling, muscle aches, shortness of breath, mood changes, visual or auditory hallucinations.  Objective:    General: Well Developed, well nourished, and in no acute distress.  Neuro: Alert and oriented x3, extra-ocular muscles intact, sensation grossly intact. Cranial nerves II through XII are intact, motor, sensory, and coordinative functions are all intact. HEENT: Normocephalic, atraumatic, pupils equal round reactive to light, neck supple, no masses, no lymphadenopathy, thyroid nonpalpable. Oropharynx, nasopharynx, external ear canals are unremarkable. Skin: Warm and dry, no rashes noted.  Cardiac: Regular rate and rhythm, no murmurs rubs or gallops.  Respiratory: Clear to auscultation bilaterally. Not using accessory muscles, speaking in full sentences.  Abdominal: Soft, nontender, nondistended, positive bowel sounds,  no masses, no organomegaly.  Musculoskeletal: Shoulder, elbow, wrist, hip, knee, ankle stable, and with full range of motion.  Impression and Recommendations:    The patient was counselled, risk factors were discussed, anticipatory guidance given.  Annual physical exam Annual physical exam. Up-to-date on screenings.  Bilateral wrist pain Historically resolved with naproxen however he has had right lower extremity DVT and more recently a saddle pulmonary  embolism, he will thus be on Eliquis lifelong, switching to arthritis strength Tylenol and topical Voltaren.  Erectile dysfunction Continue Cialis, refilling. This does help to some degree with obstructive uropathy.  Obstructive uropathy Initially improved with tadalafil, he did get orthostasis with Flomax. We will check labs including a PSA, we also discussed his nocturia, there are some lifestyle changes he can make, see above.  Insomnia Alinda Money also complains of some insomnia, he typically eats dinner at about 530 and has 3 beers, he then will be in front of a screen until he goes to bed at 10:00 or 1030, sleep latency is very short but he does wake up several times through the night to void. I explained the importance of avoiding bright blue lights before night, we showed him how to use the bluelight filter on his cell phone. I also explained centralization of peripheral fluid and its contribution to filling of the bladder. I also explained how multiple beers in the evening acted as a diuretic, he will cut back to 1 beer with dinner, empty his bladder before bedtime, continued tadalafil. He was using Tylenol PM, he will discontinue this as there is some evidence of increased dementia with chronic use of antihistamines. We will switch to trazodone starting at 50 mg at night.  He understands not to start trazodone until he has tried lifestyle changes for 2 to 4 weeks.   ____________________________________________ [name]. Benjamin Stain, M.D., ABFM., CAQSM., AME. Primary Care and Sports Medicine [facility]  Adjunct Professor of Family Medicine  [employer]  Restaurant manager, fast food

## 2021-12-14 ENCOUNTER — Inpatient Hospital Stay: Payer: BC Managed Care – PPO | Admitting: Oncology

## 2021-12-21 DIAGNOSIS — N139 Obstructive and reflux uropathy, unspecified: Secondary | ICD-10-CM | POA: Diagnosis not present

## 2021-12-21 DIAGNOSIS — E785 Hyperlipidemia, unspecified: Secondary | ICD-10-CM | POA: Diagnosis not present

## 2021-12-21 DIAGNOSIS — Z125 Encounter for screening for malignant neoplasm of prostate: Secondary | ICD-10-CM | POA: Diagnosis not present

## 2021-12-24 LAB — COMPLETE METABOLIC PANEL WITH GFR
AG Ratio: 1.6 (calc) (ref 1.0–2.5)
ALT: 43 U/L (ref 9–46)
AST: 21 U/L (ref 10–35)
Albumin: 4.2 g/dL (ref 3.6–5.1)
Alkaline phosphatase (APISO): 50 U/L (ref 35–144)
BUN: 17 mg/dL (ref 7–25)
CO2: 27 mmol/L (ref 20–32)
Calcium: 9.1 mg/dL (ref 8.6–10.3)
Chloride: 105 mmol/L (ref 98–110)
Creat: 0.98 mg/dL (ref 0.70–1.30)
Globulin: 2.6 g/dL (calc) (ref 1.9–3.7)
Glucose, Bld: 86 mg/dL (ref 65–99)
Potassium: 4.6 mmol/L (ref 3.5–5.3)
Sodium: 139 mmol/L (ref 135–146)
Total Bilirubin: 0.3 mg/dL (ref 0.2–1.2)
Total Protein: 6.8 g/dL (ref 6.1–8.1)
eGFR: 92 mL/min/{1.73_m2} (ref >=60)

## 2021-12-24 LAB — CBC
HCT: 43.1 % (ref 38.5–50.0)
Hemoglobin: 14.6 g/dL (ref 13.2–17.1)
MCH: 32.1 pg (ref 27.0–33.0)
MCHC: 33.9 g/dL (ref 32.0–36.0)
MCV: 94.7 fL (ref 80.0–100.0)
MPV: 10.1 fL (ref 7.5–12.5)
Platelets: 265 10*3/uL (ref 140–400)
RBC: 4.55 10*6/uL (ref 4.20–5.80)
RDW: 12.8 % (ref 11.0–15.0)
WBC: 6.2 10*3/uL (ref 3.8–10.8)

## 2021-12-24 LAB — LIPID PANEL
Cholesterol: 161 mg/dL (ref <200)
HDL: 49 mg/dL (ref >=40)
LDL Cholesterol (Calc): 89 mg/dL (calc)
Non-HDL Cholesterol (Calc): 112 mg/dL (calc) (ref <130)
Total CHOL/HDL Ratio: 3.3 (calc) (ref <5.0)
Triglycerides: 136 mg/dL (ref <150)

## 2021-12-24 LAB — HEMOGLOBIN A1C
Hgb A1c MFr Bld: 5 % of total Hgb (ref <5.7)
Mean Plasma Glucose: 97 mg/dL
eAG (mmol/L): 5.4 mmol/L

## 2021-12-24 LAB — TSH: TSH: 1.82 mIU/L (ref 0.40–4.50)

## 2021-12-24 LAB — PSA, TOTAL AND FREE
PSA, % Free: 29 % (calc) (ref >25)
PSA, Free: 0.2 ng/mL
PSA, Total: 0.7 ng/mL (ref <=4.0)

## 2021-12-25 ENCOUNTER — Encounter: Payer: Self-pay | Admitting: Pulmonary Disease

## 2021-12-31 ENCOUNTER — Telehealth: Payer: Self-pay

## 2021-12-31 NOTE — Telephone Encounter (Signed)
Initiated Prior authorization for:Tadalafil 5MG  tablets Via: Covermymeds Case/Key:[id number] Status: denied  as of 12/31/21 Reason:Coverage for this medication is denied for the following reason(s). We reviewed the information we received about your condition and circumstances. We used the policy plan approved when making this decision. The policy states that this medication may be approved when the member is requesting the medication for daily use for symptomatic benign prostatic hyperplasia (BPH). Based on the policy and the information we have, the request is denied. The request was denied because the information provided to [place] indicates that you are not requesting the medication for the treatment of symptomatic benign prostatic hyperplasia (BPH). We reviewed all the supporting information sent to [place] and used your plan's guidelines to make our decision. If you'd like [place] to send you a free copy of the guidelines (requirements, criteria, or protocols) we used, call the number on your prescription ID card. For more information about your prescription benefit, please refer to the prescription benefit section in your benefit plan materials. Notified Pt via: Mychart

## 2022-01-02 NOTE — Telephone Encounter (Signed)
Thank you for the update, just have him bypass insurance and use a good Rx coupon for this.

## 2022-01-03 ENCOUNTER — Ambulatory Visit (HOSPITAL_COMMUNITY): Payer: BC Managed Care – PPO | Attending: Pulmonary Disease

## 2022-01-03 DIAGNOSIS — I2602 Saddle embolus of pulmonary artery with acute cor pulmonale: Secondary | ICD-10-CM | POA: Diagnosis not present

## 2022-01-03 LAB — ECHOCARDIOGRAM LIMITED
Area-P 1/2: 4.41 cm2
S' Lateral: 3.3 cm

## 2022-01-10 ENCOUNTER — Other Ambulatory Visit: Payer: Self-pay | Admitting: Sports Medicine

## 2022-01-10 DIAGNOSIS — F5101 Primary insomnia: Secondary | ICD-10-CM

## 2022-01-11 ENCOUNTER — Inpatient Hospital Stay: Payer: BC Managed Care – PPO | Attending: Oncology | Admitting: Oncology

## 2022-01-11 ENCOUNTER — Other Ambulatory Visit: Payer: Self-pay

## 2022-01-11 VITALS — BP 140/96 | HR 68 | Temp 97.9°F | Resp 14 | Wt 193.8 lb

## 2022-01-11 DIAGNOSIS — E785 Hyperlipidemia, unspecified: Secondary | ICD-10-CM | POA: Diagnosis not present

## 2022-01-11 DIAGNOSIS — Z86718 Personal history of other venous thrombosis and embolism: Secondary | ICD-10-CM | POA: Diagnosis not present

## 2022-01-11 DIAGNOSIS — I82461 Acute embolism and thrombosis of right calf muscular vein: Secondary | ICD-10-CM

## 2022-01-11 DIAGNOSIS — Z79899 Other long term (current) drug therapy: Secondary | ICD-10-CM | POA: Insufficient documentation

## 2022-01-11 DIAGNOSIS — I2692 Saddle embolus of pulmonary artery without acute cor pulmonale: Secondary | ICD-10-CM | POA: Diagnosis not present

## 2022-01-11 DIAGNOSIS — M48061 Spinal stenosis, lumbar region without neurogenic claudication: Secondary | ICD-10-CM | POA: Diagnosis not present

## 2022-01-11 DIAGNOSIS — M5126 Other intervertebral disc displacement, lumbar region: Secondary | ICD-10-CM | POA: Diagnosis not present

## 2022-01-11 DIAGNOSIS — Z7901 Long term (current) use of anticoagulants: Secondary | ICD-10-CM | POA: Insufficient documentation

## 2022-01-11 DIAGNOSIS — I82411 Acute embolism and thrombosis of right femoral vein: Secondary | ICD-10-CM | POA: Diagnosis not present

## 2022-01-11 DIAGNOSIS — D6859 Other primary thrombophilia: Secondary | ICD-10-CM

## 2022-01-11 DIAGNOSIS — I2602 Saddle embolus of pulmonary artery with acute cor pulmonale: Secondary | ICD-10-CM

## 2022-01-11 NOTE — Progress Notes (Unsigned)
[facility] Initial Visit:  Patient Care Team: Silverio Decamp, MD as PCP - General (Family Medicine)  CHIEF COMPLAINTS/PURPOSE OF CONSULTATION:  Oncology History   No history exists.    HISTORY OF PRESENTING ILLNESS: Sean Serrano 51 y.o. male is here because of a thromboembolic event which occurred in July 2023.  April 10 2018:  LE Doppler Positive for deep venous thrombosis in the right femoral, popliteal, and visualized calf veins.   This was done because patient had right calf pain   October 06 2021:  MRI lumbar spine revealed L4-5 severe spinal stenosis due to degeneration and herniation.  Moderate left foraminal narrowing.  Remote L1 compression fracture.  October 19 2021:  Underwent L4-5 discectomy. Operative and postop course reported as uncomplicated.  Preop symptoms were remarkably better.  SCD'w were placed perioperatively. Discharged home 10/20/21.  October 25 2021:  Presented to [facility] ED with acute onset chest pain, near syncope.  He also had soreness in left thigh.  In the ED was noted to be tachycardic in the 120s and with RA O2 sat 88%.  Required requiring 2LNC, and blood pressure normotensive. CT PA demonstrated saddle pulmonary embolism with CT evidence of right heart strain (RV/LV Ratio = 1.62) consistent with at least submassive (intermediate risk) PE. Labs were notable for Troponin 4536.  Cardiac ECHO showed normal RV size but function low normal.  Septum was flattened and D shaped consistent with PE.  Tricuspid regurgitation signal inadequate for assessing PA pressure.  A heparin gtt was started.   PCCM consulted.  Thrombectomy performed to remove large volume, bilateral PE including saddle embolism.  PAP 38 mm Hg consistent with acute pulmonary arterial hypertension.  Oxygenation and tachycardia immediately improved post procedure.   October 27 2021:  Transitioned from IV heparin to Eliquis. October 28 2021:  Discharged home.    November 16 2021:   [place].  Hematology Consult   Labs from hospitalization notable for the following:  Anticardiolipan Ab negative,  Beta 2 glycolipoprotein IgA 36 but IgG and IgM negative.  Lupus anticoagulant negative.  Past medical history notable for He reports prior history of RLE DVT after knee injury in 2007 that was managed with closed reduction.  This was treated with lovenox/ coumadin.  He has history of calf vein DVT  and  LE but "wasn't the serious DVT" and treated with aspirin.   Family history notable for blood clots in his father and sister.  Father died from PE at age 73.  Sister had DVT following foot surgery.  Patient not aware if either of them underwent a hypercoagulable state evaluation.      Social:  Married.  Does not smoke.  Has daughter age 1 who has ITP.    D-dimer 0.75 fibrinogen 356 Gene studies for factor V Leiden and prothrombin mutations were negative.  INR 1.1 PTT 29 thrombin time 17.5  January 11, 2022: Scheduled follow-up for management of DVT and anticoagulation.  Reviewed results of labs with patient.   Feels well.  Notes no bleeding events on full dose Eliquis.  No LE edema, pain.  No SOB, DOE.  Reviewed results of labs and recommendations with the patient.    January 2024:  Follow up.  Check CBC with diff, CMP, D dimer and at that time decide upon considering a decrease in Eliquis to prophylactic level.   Review of Systems  Constitutional:  Negative for appetite change, chills, diaphoresis, fatigue and unexpected weight change.  Rare drenching night sweats  HENT:   Negative for mouth sores, nosebleeds and sore throat.   Eyes:  Negative for eye problems and icterus.  Respiratory:  Negative for chest tightness, cough, hemoptysis and shortness of breath.   Cardiovascular:  Negative for chest pain, leg swelling and palpitations.  Gastrointestinal:  Negative for abdominal distention, abdominal pain, blood in stool, constipation, diarrhea and nausea.   Genitourinary:  Positive for nocturia. Negative for dysuria and hematuria.   Musculoskeletal:  Negative for arthralgias, back pain and myalgias.  Hematological:  Negative for adenopathy. Does not bruise/bleed easily.    MEDICAL HISTORY: Past Medical History:  Diagnosis Date   ED (erectile dysfunction)    Hyperlipidemia    PONV (postoperative nausea and vomiting)     SURGICAL HISTORY: Past Surgical History:  Procedure Laterality Date   ac separation     BACK SURGERY     IR ANGIOGRAM PULMONARY BILATERAL SELECTIVE  10/25/2021   IR ANGIOGRAM SELECTIVE EACH ADDITIONAL VESSEL  10/25/2021   IR ANGIOGRAM SELECTIVE EACH ADDITIONAL VESSEL  10/25/2021   IR THROMBECT PRIM MECH ADD (INCLU) MOD SED  10/25/2021   IR THROMBECT PRIM MECH INIT (INCLU) MOD SED  10/25/2021   IR US GUIDE VASC ACCESS RIGHT  10/25/2021   LUMBAR LAMINECTOMY/DECOMPRESSION MICRODISCECTOMY Bilateral 10/19/2021   Procedure: Bilateral Lumbar four-five  Microdiscectomy;  Surgeon: Kristeen Miss, MD;  Location: [place];  Service: Neurosurgery;  Laterality: Bilateral;   SHOULDER SURGERY      SOCIAL HISTORY: Social History   Socioeconomic History   Marital status: Married    Spouse name: Not on file   Number of children: Not on file   Years of education: Not on file   Highest education level: Not on file  Occupational History   Not on file  Tobacco Use   Smoking status: Former    Packs/day: 2.00    Years: 4.00    Total pack years: 8.00    Types: Cigarettes    Quit date: 04/01/1988    Years since quitting: 33.8   Smokeless tobacco: Never  Vaping Use   Vaping Use: Never used  Substance and Sexual Activity   Alcohol use: Yes    Alcohol/week: 12.0 standard drinks of alcohol    Types: 12 Cans of beer per week    Comment: weekly   Drug use: No   Sexual activity: Not on file  Other Topics Concern   Not on file  Social History Narrative   Not on file   Social Determinants of Health   Financial Resource Strain: Not on  file  Food Insecurity: Not on file  Transportation Needs: Not on file  Physical Activity: Not on file  Stress: Not on file  Social Connections: Not on file  Intimate Partner Violence: Not on file    FAMILY HISTORY Family History  Problem Relation Age of Onset   Healthy Mother     ALLERGIES:  has No Known Allergies.  MEDICATIONS:  Current Outpatient Medications  Medication Sig Dispense Refill   acetaminophen (TYLENOL) 650 MG CR tablet Take 1 tablet (650 mg total) by mouth in the morning and at bedtime. 90 tablet 3   apixaban (ELIQUIS) 5 MG TABS tablet Take 1 tablet (5 mg total) by mouth 2 (two) times daily. 60 tablet 6   atorvastatin (LIPITOR) 10 MG tablet Take 1 tablet (10 mg total) by mouth daily. 90 tablet 3   calcium carbonate (TUMS EX) 750 MG chewable tablet Chew 750 mg by mouth  daily as needed for heartburn.     diclofenac Sodium (VOLTAREN) 1 % GEL Apply 2 g topically 4 (four) times daily. To affected joint. 100 g 11   diphenhydramine-acetaminophen (TYLENOL PM) 25-500 MG TABS tablet Take 2 tablets by mouth at bedtime as needed (pain, sleep).     famotidine (PEPCID) 10 MG tablet Take 10 mg by mouth daily as needed for heartburn.     Soft Lens Products (REWETTING DROPS) SOLN Place 1 drop into both eyes daily. [employer] brand lubricating + rewetting eye drops     tadalafil (CIALIS) 5 MG tablet Take 1 tablet (5 mg total) by mouth daily. 90 tablet 3   traZODone (DESYREL) 50 MG tablet ONE HALF TAB NIGHTLY FOR A WEEK THEN 1 TAB NIGHTLY FOR A WEEK THEN 2 TABS NIGHTLY FOR A WEEK THEN 3 TABS NIGHTLY, ONLY INCREASE IF NEEDED. 60 tablet 3   No current facility-administered medications for this visit.    PHYSICAL EXAMINATION:  ECOG PERFORMANCE STATUS: 0 - Asymptomatic   Vitals:   01/11/22 0830  BP: (!) 140/96  Pulse: 68  Resp: 14  Temp: 97.9 F (36.6 C)  SpO2: 100%    Filed Weights   01/11/22 0830  Weight: 193 lb 12.8 oz (87.9 kg)     Physical Exam Vitals and nursing note  reviewed.  Constitutional:      General: He is not in acute distress.    Appearance: Normal appearance. He is not ill-appearing, toxic-appearing or diaphoretic.     Comments: Here with wife.  Looks well  Cardiovascular:     Rate and Rhythm: Normal rate and regular rhythm.     Heart sounds: No murmur heard.    No friction rub. No gallop.  Pulmonary:     Effort: Pulmonary effort is normal. No respiratory distress.     Breath sounds: Normal breath sounds. No wheezing, rhonchi or rales.  Chest:     Chest wall: No tenderness.  Musculoskeletal:        General: No swelling, tenderness, deformity or signs of injury. Normal range of motion.     Cervical back: Neck supple. No rigidity or tenderness.     Right lower leg: No edema.     Left lower leg: No edema.  Lymphadenopathy:     Cervical: No cervical adenopathy.  Skin:    General: Skin is warm and dry.     Findings: No bruising, erythema, lesion or rash.  Neurological:     General: No focal deficit present.     Mental Status: He is alert and oriented to person, place, and time.     Cranial Nerves: No cranial nerve deficit.     Motor: No weakness.     Gait: Gait normal.  Psychiatric:        Mood and Affect: Mood normal.        Behavior: Behavior normal.        Thought Content: Thought content normal.     LABORATORY DATA: I have personally reviewed the data as listed:  Appointment on 01/03/2022  Component Date Value Ref Range Status   Area-P 1/2 01/03/2022 4.41  cm2 Final   S' Lateral 01/03/2022 3.30  cm Final     ASSESSMENT/ PLAN 51 y.o. male with history of recurrent thromboembolic events the latest of which occurred in July 2023.  VTE following elective spine surgery:  A review of the orthopedic literature indicates that the  reported incidence of VTE in patients undergoing spine surgery range from  0.29% - 31%1-3. Moreover, the overall rates of pulmonary (PE) and associated fatality after spinal surgery are 1.38% and 0.34%,  respectively.  With regard to chemoprophylaxis the risk of VTE is balanced against the risk of epidural hematoma.  There remains controversy regarding the use of routine screening for DVT in the perioperative period for patients undergoing spine procedures.  Based on the available literature, the risk factors for an increased risk of VTE in patients undergoing spine surgery may be seen in older patients, long periods of bedrest from paralysis and pain, high D-dimer level, longer duration of operation, intraoperative blood loss and transfusion, previous history of VTE, fracture, comorbid disease burden and tumor surgery.  Of all the listed risk factors patient's only one was prior history of VTE thus suggesting the possibility of a hypercoagulable state.     Hypercoagulable state evaluation:  This is reasonable given the history of VTE following events which do not usually precipitate an event.  Patient also has a strong family history suggesting an inherited disorder.  Evaluation thus far consisted of testing for antiphospholipid antibodies.  This was notable for a positive IgA Anti-beta2 glycolipoprotein however its association with VTA is weak.  Testing for FV Leiden and Prothrombin mutations were negative .  Protein C, S and ATIII will be affected by Apixiban.   August 2023:  Fibrinogen level normal.  D dimer  0.75  Therapeutics:  Recommend continuing full dose Apixiban for 6 months and following D dimer levels.  This would be in approximately January 2024.  Would then consider decreasing to prophylactic dose based on clinical condition and D dimer results.  Anticoagulation should be life long    Cancer Staging  No matching staging information was found for the patient.   No problem-specific Assessment & Plan notes found for this encounter.   No orders of the defined types were placed in this encounter.   All questions were answered. The patient knows to call the clinic with any problems,  questions or concerns.  This note was electronically signed.    Barbee Cough, MD  01/11/2022 8:40 AM

## 2022-01-28 ENCOUNTER — Ambulatory Visit: Payer: BC Managed Care – PPO | Admitting: Sports Medicine

## 2022-02-01 ENCOUNTER — Other Ambulatory Visit: Payer: Self-pay

## 2022-02-01 DIAGNOSIS — E785 Hyperlipidemia, unspecified: Secondary | ICD-10-CM

## 2022-02-01 MED ORDER — ATORVASTATIN CALCIUM 10 MG PO TABS: 10.0000 mg | ORAL_TABLET | Freq: Every day | ORAL | 3 refills | Status: DC

## 2022-02-01 NOTE — Telephone Encounter (Signed)
Fax rec'd from [employer]

## 2022-02-14 ENCOUNTER — Telehealth (INDEPENDENT_AMBULATORY_CARE_PROVIDER_SITE_OTHER): Payer: BC Managed Care – PPO | Admitting: Pulmonary Disease

## 2022-02-14 ENCOUNTER — Encounter: Payer: Self-pay | Admitting: Pulmonary Disease

## 2022-02-14 DIAGNOSIS — I2602 Saddle embolus of pulmonary artery with acute cor pulmonale: Secondary | ICD-10-CM

## 2022-02-14 DIAGNOSIS — I82402 Acute embolism and thrombosis of unspecified deep veins of left lower extremity: Secondary | ICD-10-CM | POA: Diagnosis not present

## 2022-02-14 NOTE — Progress Notes (Signed)
Virtual Visit via Video Note  I connected with Sean Serrano on 02/14/22 at  8:30 AM EST by a video enabled telemedicine application and verified that I am speaking with the correct person using two identifiers.  Location: Patient: car Provider: clinic   I discussed the limitations of evaluation and management by telemedicine and the availability of in person appointments. The patient expressed understanding and agreed to proceed.  History of Present Illness: Sean Serrano is a 51 year old male, former smoker with history of multiple DVTs who was admitted 7/27 to 7/30 for pulmonary embolus and DVT after L4-L5 lumbar microdiscectomy on 7/21 we returns to pulmonary clinic for follow up.    He saw hematology 01/11/22 with review of hypercoagulable testing not revealing at this time. Unable to test for factor V leiden and prothrombin mutations due to being on eliquis. Life long anticoaguluation is recommended and will consider dose reduction in January 2024 to prophylactic eliquis dosing.  He reports feeling well. He has been active without any issues of dyspnea. He noted one bowel movement with blood tinged stool otherwise no other episodes. He had cologaurd testing 2 years ago with his GI doctor.   Echo 01/03/22 is within normal limits.   Observations/Objective: No acute distress  Assessment and Plan: Thromboembolic Disease  He is to continue eliquis per hematology recommendations with plan to transition to prophylactic eliquis dosing in January 2024.   No further pulmonary follow up needed as echo returned to normal 01/03/22.  Follow Up Instructions: Follow up as needed   I discussed the assessment and treatment plan with the patient. The patient was provided an opportunity to ask questions and all were answered. The patient agreed with the plan and demonstrated an understanding of the instructions.   The patient was advised to call back or seek an in-person evaluation if the  symptoms worsen or if the condition fails to improve as anticipated.  I provided 25 minutes of non-face-to-face time during this encounter.   Martina Sinner, MD

## 2022-02-14 NOTE — Patient Instructions (Signed)
Continue eliquis as planned by Hematology  Follow up as needed in the future

## 2022-03-12 ENCOUNTER — Other Ambulatory Visit: Payer: Self-pay | Admitting: Sports Medicine

## 2022-03-12 DIAGNOSIS — M25531 Pain in right wrist: Secondary | ICD-10-CM

## 2022-04-18 ENCOUNTER — Other Ambulatory Visit: Payer: Self-pay

## 2022-04-19 ENCOUNTER — Ambulatory Visit: Payer: BC Managed Care – PPO | Admitting: Oncology

## 2022-04-19 ENCOUNTER — Inpatient Hospital Stay: Payer: BC Managed Care – PPO | Attending: Oncology

## 2022-04-19 ENCOUNTER — Encounter: Payer: Self-pay | Admitting: Oncology

## 2022-04-19 ENCOUNTER — Inpatient Hospital Stay (HOSPITAL_BASED_OUTPATIENT_CLINIC_OR_DEPARTMENT_OTHER): Payer: BC Managed Care – PPO | Admitting: Oncology

## 2022-04-19 VITALS — BP 132/86 | HR 83 | Temp 98.1°F | Resp 15 | Wt 180.9 lb

## 2022-04-19 DIAGNOSIS — Z7289 Other problems related to lifestyle: Secondary | ICD-10-CM | POA: Insufficient documentation

## 2022-04-19 DIAGNOSIS — D6859 Other primary thrombophilia: Secondary | ICD-10-CM

## 2022-04-19 DIAGNOSIS — I82461 Acute embolism and thrombosis of right calf muscular vein: Secondary | ICD-10-CM

## 2022-04-19 DIAGNOSIS — Z87891 Personal history of nicotine dependence: Secondary | ICD-10-CM | POA: Diagnosis not present

## 2022-04-19 DIAGNOSIS — R531 Weakness: Secondary | ICD-10-CM | POA: Diagnosis not present

## 2022-04-19 DIAGNOSIS — Z7901 Long term (current) use of anticoagulants: Secondary | ICD-10-CM | POA: Insufficient documentation

## 2022-04-19 DIAGNOSIS — I2602 Saddle embolus of pulmonary artery with acute cor pulmonale: Secondary | ICD-10-CM

## 2022-04-19 DIAGNOSIS — Z79899 Other long term (current) drug therapy: Secondary | ICD-10-CM | POA: Diagnosis not present

## 2022-04-19 DIAGNOSIS — M48061 Spinal stenosis, lumbar region without neurogenic claudication: Secondary | ICD-10-CM | POA: Insufficient documentation

## 2022-04-19 DIAGNOSIS — M5126 Other intervertebral disc displacement, lumbar region: Secondary | ICD-10-CM | POA: Insufficient documentation

## 2022-04-19 DIAGNOSIS — Z86718 Personal history of other venous thrombosis and embolism: Secondary | ICD-10-CM | POA: Diagnosis not present

## 2022-04-19 DIAGNOSIS — R000 Tachycardia, unspecified: Secondary | ICD-10-CM | POA: Insufficient documentation

## 2022-04-19 DIAGNOSIS — R61 Generalized hyperhidrosis: Secondary | ICD-10-CM | POA: Insufficient documentation

## 2022-04-19 DIAGNOSIS — E785 Hyperlipidemia, unspecified: Secondary | ICD-10-CM | POA: Diagnosis not present

## 2022-04-19 DIAGNOSIS — R351 Nocturia: Secondary | ICD-10-CM | POA: Diagnosis not present

## 2022-04-19 LAB — CMP (CANCER CENTER ONLY)
ALT: 16 U/L (ref 0–44)
AST: 17 U/L (ref 15–41)
Albumin: 4.2 g/dL (ref 3.5–5.0)
Alkaline Phosphatase: 33 U/L — ABNORMAL LOW (ref 38–126)
Anion gap: 6 (ref 5–15)
BUN: 17 mg/dL (ref 6–20)
CO2: 26 mmol/L (ref 22–32)
Calcium: 9.5 mg/dL (ref 8.9–10.3)
Chloride: 107 mmol/L (ref 98–111)
Creatinine: 1.09 mg/dL (ref 0.61–1.24)
GFR, Estimated: >60 mL/min (ref >60)
Glucose, Bld: 79 mg/dL (ref 70–99)
Potassium: 4.7 mmol/L (ref 3.5–5.1)
Sodium: 139 mmol/L (ref 135–145)
Total Bilirubin: 0.8 mg/dL (ref 0.3–1.2)
Total Protein: 6.9 g/dL (ref 6.5–8.1)

## 2022-04-19 LAB — CBC WITH DIFFERENTIAL (CANCER CENTER ONLY)
Abs Immature Granulocytes: 0.01 10*3/uL (ref 0.00–0.07)
Basophils Absolute: 0.1 10*3/uL (ref 0.0–0.1)
Basophils Relative: 1 %
Eosinophils Absolute: 0.3 10*3/uL (ref 0.0–0.5)
Eosinophils Relative: 6 %
HCT: 45.3 % (ref 39.0–52.0)
Hemoglobin: 16.6 g/dL (ref 13.0–17.0)
Immature Granulocytes: 0 %
Lymphocytes Relative: 40 %
Lymphs Abs: 2.4 10*3/uL (ref 0.7–4.0)
MCH: 32.9 pg (ref 26.0–34.0)
MCHC: 36.6 g/dL — ABNORMAL HIGH (ref 30.0–36.0)
MCV: 89.7 fL (ref 80.0–100.0)
Monocytes Absolute: 0.5 10*3/uL (ref 0.1–1.0)
Monocytes Relative: 8 %
Neutro Abs: 2.7 10*3/uL (ref 1.7–7.7)
Neutrophils Relative %: 45 %
Platelet Count: 205 10*3/uL (ref 150–400)
RBC: 5.05 MIL/uL (ref 4.22–5.81)
RDW: 12.3 % (ref 11.5–15.5)
WBC Count: 6 10*3/uL (ref 4.0–10.5)
nRBC: 0 % (ref 0.0–0.2)

## 2022-04-19 LAB — D-DIMER, QUANTITATIVE: D-Dimer, Quant: <0.27 ug/mL-FEU (ref 0.00–0.50)

## 2022-04-19 MED ORDER — RIVAROXABAN 15 MG PO TABS: 15.0000 mg | ORAL_TABLET | Freq: Every day | ORAL | 3 refills | Status: DC

## 2022-04-19 NOTE — Patient Instructions (Signed)
Once you have used up your current supply of Eliquis please start Xarelto 15 mg daily

## 2022-04-19 NOTE — Progress Notes (Signed)
[facility] Cancer Follow up Visit:  Patient Care Team: Monica Becton, MD as PCP - General (Family Medicine) Loni Muse, MD as Attending Physician (Hematology)  CHIEF COMPLAINTS/PURPOSE OF CONSULTATION:  Oncology History   No history exists.    HISTORY OF PRESENTING ILLNESS: Sean Serrano 52 y.o. male is here because of a thromboembolic event which occurred in July 2023.  April 10 2018:  LE Doppler Positive for deep venous thrombosis in the right femoral, popliteal, and visualized calf veins.   This was done because patient had right calf pain   October 06 2021:  MRI lumbar spine revealed L4-5 severe spinal stenosis due to degeneration and herniation.  Moderate left foraminal narrowing.  Remote L1 compression fracture.  October 19 2021:  Underwent L4-5 discectomy. Operative and postop course reported as uncomplicated.  Preop symptoms were remarkably better.  SCD'w were placed perioperatively. Discharged home 10/20/21.  October 25 2021:  Presented to [facility] ED with acute onset chest pain, near syncope.  He also had soreness in left thigh.  In the ED was noted to be tachycardic in the 120s and with RA O2 sat 88%.  Required requiring 2LNC, and blood pressure normotensive. CT PA demonstrated saddle pulmonary embolism with CT evidence of right heart strain (RV/LV Ratio = 1.62) consistent with at least submassive (intermediate risk) PE. Labs were notable for Troponin 4536.  Cardiac ECHO showed normal RV size but function low normal.  Septum was flattened and D shaped consistent with PE.  Tricuspid regurgitation signal inadequate for assessing PA pressure.  A heparin gtt was started.   PCCM consulted.  Thrombectomy performed to remove large volume, bilateral PE including saddle embolism.  PAP 38 mm Hg consistent with acute pulmonary arterial hypertension.  Oxygenation and tachycardia immediately improved post procedure.   October 27 2021:  Transitioned from IV heparin to  Eliquis. October 28 2021:  Discharged home.    November 16 2021:  [place].  Hematology Consult   Labs from hospitalization notable for the following:  Anticardiolipan Ab negative,  Beta 2 glycolipoprotein IgA 36 but IgG and IgM negative.  Lupus anticoagulant negative.  Past medical history notable for He reports prior history of RLE DVT after knee injury in 2007 that was managed with closed reduction.  This was treated with lovenox/ coumadin.  He has history of calf vein DVT  and  LE but "wasn't the serious DVT" and treated with aspirin.   Family history notable for blood clots in his father and sister.  Father died from PE at age 42.  Sister had DVT following foot surgery.  Patient not aware if either of them underwent a hypercoagulable state evaluation.      Social:  Married.  Does not smoke.  Has daughter age 30 who has ITP.    D-dimer 0.75 fibrinogen 356 Gene studies for factor V Leiden and prothrombin mutations were negative.  INR 1.1 PTT 29 thrombin time 17.5  January 11, 2022: .  Reviewed results of labs with patient.   Feels well.  Notes no bleeding events on full dose Eliquis.  No LE edema, pain.  No SOB, DOE.  Reviewed results of labs and recommendations with the patient.    April 19 2022:  Scheduled follow-up for management of DVT and anticoagulation.  Reviewed results of labs with patient.  He is on Eliquis 5 mg po bid.  No bleeding issues  WBC 6.0 hemoglobin 16.6 platelet count 205; 45 segs 40 lymphs  8 monos 6 eos 1 basophil. CMP notable for creatinine 1.09.  d-Dimer undetectable Will change to Xarelto 15 mg daily once he has completed current supply of Eliquis  April 2024:  Check CBC with diff, CMP, D dimer and at that time.  Will decide if further reduction in Xarelto dose is possible     Review of Systems  Constitutional:  Negative for appetite change, chills, diaphoresis, fatigue and unexpected weight change.       Rare drenching night sweats  HENT:   Negative for  mouth sores, nosebleeds and sore throat.   Eyes:  Negative for eye problems and icterus.  Respiratory:  Negative for chest tightness, cough, hemoptysis and shortness of breath.   Cardiovascular:  Negative for chest pain, leg swelling and palpitations.  Gastrointestinal:  Negative for abdominal distention, abdominal pain, blood in stool, constipation, diarrhea and nausea.  Genitourinary:  Positive for nocturia. Negative for dysuria and hematuria.   Musculoskeletal:  Negative for arthralgias, back pain and myalgias.  Hematological:  Negative for adenopathy. Does not bruise/bleed easily.    MEDICAL HISTORY: Past Medical History:  Diagnosis Date   ED (erectile dysfunction)    Hyperlipidemia    PONV (postoperative nausea and vomiting)     SURGICAL HISTORY: Past Surgical History:  Procedure Laterality Date   ac separation     BACK SURGERY     IR ANGIOGRAM PULMONARY BILATERAL SELECTIVE  10/25/2021   IR ANGIOGRAM SELECTIVE EACH ADDITIONAL VESSEL  10/25/2021   IR ANGIOGRAM SELECTIVE EACH ADDITIONAL VESSEL  10/25/2021   IR THROMBECT PRIM MECH ADD (INCLU) MOD SED  10/25/2021   IR THROMBECT PRIM MECH INIT (INCLU) MOD SED  10/25/2021   IR US GUIDE VASC ACCESS RIGHT  10/25/2021   LUMBAR LAMINECTOMY/DECOMPRESSION MICRODISCECTOMY Bilateral 10/19/2021   Procedure: Bilateral Lumbar four-five  Microdiscectomy;  Surgeon: Barnett Abu, MD;  Location: [facility] OR;  Service: Neurosurgery;  Laterality: Bilateral;   SHOULDER SURGERY      SOCIAL HISTORY: Social History   Socioeconomic History   Marital status: Married    Spouse name: Not on file   Number of children: Not on file   Years of education: Not on file   Highest education level: Not on file  Occupational History   Not on file  Tobacco Use   Smoking status: Former    Packs/day: 2.00    Years: 4.00    Total pack years: 8.00    Types: Cigarettes    Quit date: 04/01/1988    Years since quitting: 34.0   Smokeless tobacco: Never  Vaping Use    Vaping Use: Never used  Substance and Sexual Activity   Alcohol use: Yes    Alcohol/week: 12.0 standard drinks of alcohol    Types: 12 Cans of beer per week    Comment: weekly   Drug use: No   Sexual activity: Not on file  Other Topics Concern   Not on file  Social History Narrative   Not on file   Social Determinants of Health   Financial Resource Strain: Not on file  Food Insecurity: Not on file  Transportation Needs: Not on file  Physical Activity: Not on file  Stress: Not on file  Social Connections: Not on file  Intimate Partner Violence: Not on file    FAMILY HISTORY Family History  Problem Relation Age of Onset   Healthy Mother     ALLERGIES:  has No Known Allergies.  MEDICATIONS:  Current Outpatient Medications  Medication Sig Dispense Refill  acetaminophen (TYLENOL) 650 MG CR tablet Take 1 tablet (650 mg total) by mouth in the morning and at bedtime. 90 tablet 3   apixaban (ELIQUIS) 5 MG TABS tablet Take 1 tablet (5 mg total) by mouth 2 (two) times daily. 60 tablet 6   atorvastatin (LIPITOR) 10 MG tablet Take 1 tablet (10 mg total) by mouth daily. 90 tablet 3   calcium carbonate (TUMS EX) 750 MG chewable tablet Chew 750 mg by mouth daily as needed for heartburn.     diclofenac Sodium (VOLTAREN) 1 % GEL Apply 2 g topically 4 (four) times daily. To affected joint. 100 g 11   diphenhydramine-acetaminophen (TYLENOL PM) 25-500 MG TABS tablet Take 2 tablets by mouth at bedtime as needed (pain, sleep).     famotidine (PEPCID) 10 MG tablet Take 10 mg by mouth daily as needed for heartburn.     Soft Lens Products (REWETTING DROPS) SOLN Place 1 drop into both eyes daily. [employer] brand lubricating + rewetting eye drops     tadalafil (CIALIS) 5 MG tablet Take 1 tablet (5 mg total) by mouth daily. 90 tablet 3   No current facility-administered medications for this visit.    PHYSICAL EXAMINATION:  ECOG PERFORMANCE STATUS: 0 - Asymptomatic   Vitals:   04/19/22 0907   BP: 132/86  Pulse: 83  Resp: 15  Temp: 98.1 F (36.7 C)  SpO2: 99%    Filed Weights   04/19/22 0907  Weight: 180 lb 14.4 oz (82.1 kg)     Physical Exam Vitals and nursing note reviewed.  Constitutional:      General: He is not in acute distress.    Appearance: Normal appearance. He is not ill-appearing, toxic-appearing or diaphoretic.     Comments: Here with wife.  Looks well  Cardiovascular:     Rate and Rhythm: Normal rate and regular rhythm.     Heart sounds: No murmur heard.    No friction rub. No gallop.  Pulmonary:     Effort: Pulmonary effort is normal. No respiratory distress.     Breath sounds: Normal breath sounds. No wheezing, rhonchi or rales.  Chest:     Chest wall: No tenderness.  Musculoskeletal:        General: No swelling, tenderness, deformity or signs of injury. Normal range of motion.     Cervical back: Neck supple. No rigidity or tenderness.     Right lower leg: No edema.     Left lower leg: No edema.  Lymphadenopathy:     Cervical: No cervical adenopathy.  Skin:    General: Skin is warm and dry.     Findings: No bruising, erythema, lesion or rash.  Neurological:     General: No focal deficit present.     Mental Status: He is alert and oriented to person, place, and time.     Cranial Nerves: No cranial nerve deficit.     Motor: No weakness.     Gait: Gait normal.  Psychiatric:        Mood and Affect: Mood normal.        Behavior: Behavior normal.        Thought Content: Thought content normal.     LABORATORY DATA: I have personally reviewed the data as listed:  Appointment on 04/19/2022  Component Date Value Ref Range Status   D-Dimer, Quant 04/19/2022 <0.27  0.00 - 0.50 ug/mL-FEU Final   Comment: (NOTE) At the manufacturer cut-off value of 0.5 g/mL FEU, this assay has a  negative predictive value of 95-100%.This assay is intended for use in conjunction with a clinical pretest probability (PTP) assessment model to exclude pulmonary  embolism (PE) and deep venous thrombosis (DVT) in outpatients suspected of PE or DVT. Results should be correlated with clinical presentation. Performed at [facility], [place] [address], [place], [place] [address]    Sodium 04/19/2022 139  135 - 145 mmol/L Final   Potassium 04/19/2022 4.7  3.5 - 5.1 mmol/L Final   Chloride 04/19/2022 107  98 - 111 mmol/L Final   CO2 04/19/2022 26  22 - 32 mmol/L Final   Glucose, Bld 04/19/2022 79  70 - 99 mg/dL Final   Glucose reference range applies only to samples taken after fasting for at least 8 hours.   BUN 04/19/2022 17  6 - 20 mg/dL Final   Creatinine 04/19/2022 1.09  0.61 - 1.24 mg/dL Final   Calcium 04/19/2022 9.5  8.9 - 10.3 mg/dL Final   Total Protein 04/19/2022 6.9  6.5 - 8.1 g/dL Final   Albumin 04/19/2022 4.2  3.5 - 5.0 g/dL Final   AST 04/19/2022 17  15 - 41 U/L Final   ALT 04/19/2022 16  0 - 44 U/L Final   Alkaline Phosphatase 04/19/2022 33 (L)  38 - 126 U/L Final   Total Bilirubin 04/19/2022 0.8  0.3 - 1.2 mg/dL Final   GFR, Estimated 04/19/2022 >60  >60 mL/min Final   Comment: (NOTE) Calculated using the CKD-EPI Creatinine Equation (2021)    Anion gap 04/19/2022 6  5 - 15 Final   Performed at [facility], [place] [address] [place], [place] [address]   WBC Count 04/19/2022 6.0  4.0 - 10.5 K/uL Final   RBC 04/19/2022 5.05  4.22 - 5.81 MIL/uL Final   Hemoglobin 04/19/2022 16.6  13.0 - 17.0 g/dL Final   HCT 04/19/2022 45.3  39.0 - 52.0 % Final   MCV 04/19/2022 89.7  80.0 - 100.0 fL Final   MCH 04/19/2022 32.9  26.0 - 34.0 pg Final   MCHC 04/19/2022 36.6 (H)  30.0 - 36.0 g/dL Final   RDW 04/19/2022 12.3  11.5 - 15.5 % Final   Platelet Count 04/19/2022 205  150 - 400 K/uL Final   nRBC 04/19/2022 0.0  0.0 - 0.2 % Final   Neutrophils Relative % 04/19/2022 45  % Final   Neutro Abs 04/19/2022 2.7  1.7 - 7.7 K/uL Final   Lymphocytes Relative 04/19/2022 40  % Final   Lymphs Abs 04/19/2022 2.4   0.7 - 4.0 K/uL Final   Monocytes Relative 04/19/2022 8  % Final   Monocytes Absolute 04/19/2022 0.5  0.1 - 1.0 K/uL Final   Eosinophils Relative 04/19/2022 6  % Final   Eosinophils Absolute 04/19/2022 0.3  0.0 - 0.5 K/uL Final   Basophils Relative 04/19/2022 1  % Final   Basophils Absolute 04/19/2022 0.1  0.0 - 0.1 K/uL Final   Immature Granulocytes 04/19/2022 0  % Final   Abs Immature Granulocytes 04/19/2022 0.01  0.00 - 0.07 K/uL Final   Performed at [facility], [place] [address] [place],  [address]     ASSESSMENT/ PLAN 52 y.o. male with history of recurrent thromboembolic events the latest of which occurred in July 2023.  VTE following elective spine surgery:  A review of the orthopedic literature indicates that the  reported incidence of VTE in patients undergoing spine surgery range from 0.29% - 31%1-3. Moreover, the overall rates  of pulmonary (PE) and associated fatality after spinal surgery are 1.38% and 0.34%, respectively.  With regard to chemoprophylaxis the risk of VTE is balanced against the risk of epidural hematoma.  There remains controversy regarding the use of routine screening for DVT in the perioperative period for patients undergoing spine procedures.  Based on the available literature, the risk factors for an increased risk of VTE in patients undergoing spine surgery may be seen in older patients, long periods of bedrest from paralysis and pain, high D-dimer level, longer duration of operation, intraoperative blood loss and transfusion, previous history of VTE, fracture, comorbid disease burden and tumor surgery.  Of all the listed risk factors patient's only one was prior history of VTE thus suggesting the possibility of a hypercoagulable state.     Hypercoagulable state evaluation:  This is reasonable given the history of VTE following events which do not usually precipitate an event.  Patient also has a strong family history suggesting an  inherited disorder.  Evaluation thus far consisted of testing for antiphospholipid antibodies.  This was notable for a positive IgA Anti-beta2 glycolipoprotein however its association with VTA is weak.  Testing for FV Leiden and Prothrombin mutations were negative .  Protein C, S and ATIII will be affected by Apixiban.   August 2023:  Fibrinogen level normal.  D dimer  0.75   Therapeutics:  Recommend continuing full dose Apixiban for 6 months and following D dimer levels.  April 19 2022:  On Eliquis 5 mg po bid.  D dimer undetectable.  Will change anticoagulation to Xarelto 15 mg daily which represents a dose decrease.  Daily dosing will help compliance.   Follow up visit in 3 months an will recheck D dimer to determine if another dose reduction is possible.   Anticoagulation should be life long    Cancer Staging  No matching staging information was found for the patient.   No problem-specific Assessment & Plan notes found for this encounter.   No orders of the defined types were placed in this encounter.  25  minutes was spent in patient care.  This included time spent preparing to see the patient (e.g., review of tests), obtaining and/or reviewing separately obtained history, counseling and educating the patient, ordering medications, tests, or procedures; documenting clinical information in the electronic or other health record, independently interpreting results and communicating results to the patient as well as coordination of care.      All questions were answered. The patient knows to call the clinic with any problems, questions or concerns.  This note was electronically signed.    Loni Muse, MD  04/19/2022 9:54 AM

## 2022-05-23 ENCOUNTER — Telehealth: Payer: Self-pay

## 2022-05-23 NOTE — Telephone Encounter (Signed)
Patient left VM and states the doctor he goes to another doctor to get Xarelto and he states it has gotten to expensive for himto  be seen in that office and was wondering can he see you and you refill it.

## 2022-05-23 NOTE — Telephone Encounter (Signed)
Yes I am happy to do the Xarelto, is he ready for refill now?

## 2022-05-24 NOTE — Telephone Encounter (Signed)
Patient states not right now but he will let you know.

## 2022-07-15 ENCOUNTER — Encounter: Payer: Self-pay | Admitting: *Deleted

## 2022-07-19 ENCOUNTER — Ambulatory Visit (INDEPENDENT_AMBULATORY_CARE_PROVIDER_SITE_OTHER): Payer: BC Managed Care – PPO | Admitting: Sports Medicine

## 2022-07-19 ENCOUNTER — Ambulatory Visit: Payer: BC Managed Care – PPO | Admitting: Oncology

## 2022-07-19 ENCOUNTER — Other Ambulatory Visit: Payer: BC Managed Care – PPO

## 2022-07-19 DIAGNOSIS — Z0000 Encounter for general adult medical examination without abnormal findings: Secondary | ICD-10-CM | POA: Diagnosis not present

## 2022-07-19 DIAGNOSIS — B079 Viral wart, unspecified: Secondary | ICD-10-CM | POA: Diagnosis not present

## 2022-07-19 DIAGNOSIS — Z7901 Long term (current) use of anticoagulants: Secondary | ICD-10-CM | POA: Diagnosis not present

## 2022-07-19 DIAGNOSIS — Z1211 Encounter for screening for malignant neoplasm of colon: Secondary | ICD-10-CM | POA: Diagnosis not present

## 2022-07-19 MED ORDER — RIVAROXABAN 15 MG PO TABS: 15.0000 mg | ORAL_TABLET | Freq: Every day | ORAL | 3 refills | Status: DC

## 2022-07-19 NOTE — Assessment & Plan Note (Signed)
Cryotherapy on a right thumb verruca. Return to see me in a month if needed.

## 2022-07-19 NOTE — Assessment & Plan Note (Signed)
Due for colon cancer screening, last Cologuard was normal in 2020.

## 2022-07-19 NOTE — Assessment & Plan Note (Signed)
History of PE, currently on lifelong Xarelto, happy to take over refilling the medication.

## 2022-07-19 NOTE — Progress Notes (Signed)
    Procedures performed today:    Procedure:  Cryodestruction of right thumb wart Consent obtained and verified. Time-out conducted. Noted no overlying erythema, induration, or other signs of local infection. Completed without difficulty using Cryo-Gun. Advised to call if fevers/chills, erythema, induration, drainage, or persistent bleeding.  Independent interpretation of notes and tests performed by another provider:   None.  Brief History, Exam, Impression, and Recommendations:    Viral wart on right thumb Cryotherapy on a right thumb verruca. Return to see me in a month if needed.  Annual physical exam Due for colon cancer screening, last Cologuard was normal in 2020.  Chronic anticoagulation History of PE, currently on lifelong Xarelto, happy to take over refilling the medication.    ____________________________________________ [name]. Benjamin Stain, M.D., ABFM., CAQSM., AME. Primary Care and Sports Medicine [facility]  Adjunct Professor of Family Medicine  [employer]  Restaurant manager, fast food

## 2022-07-31 DIAGNOSIS — Z1211 Encounter for screening for malignant neoplasm of colon: Secondary | ICD-10-CM | POA: Diagnosis not present

## 2022-08-08 LAB — COLOGUARD: COLOGUARD: NEGATIVE

## 2022-11-23 ENCOUNTER — Other Ambulatory Visit: Payer: Self-pay | Admitting: Sports Medicine

## 2022-11-23 DIAGNOSIS — N529 Male erectile dysfunction, unspecified: Secondary | ICD-10-CM

## 2022-12-26 ENCOUNTER — Ambulatory Visit (INDEPENDENT_AMBULATORY_CARE_PROVIDER_SITE_OTHER): Payer: BC Managed Care – PPO | Admitting: Sports Medicine

## 2022-12-26 VITALS — BP 151/90 | HR 84 | Ht 69.0 in | Wt 193.0 lb

## 2022-12-26 DIAGNOSIS — E785 Hyperlipidemia, unspecified: Secondary | ICD-10-CM

## 2022-12-26 DIAGNOSIS — Z7901 Long term (current) use of anticoagulants: Secondary | ICD-10-CM | POA: Diagnosis not present

## 2022-12-26 DIAGNOSIS — Z0000 Encounter for general adult medical examination without abnormal findings: Secondary | ICD-10-CM | POA: Diagnosis not present

## 2022-12-26 DIAGNOSIS — N139 Obstructive and reflux uropathy, unspecified: Secondary | ICD-10-CM | POA: Diagnosis not present

## 2022-12-26 DIAGNOSIS — R030 Elevated blood-pressure reading, without diagnosis of hypertension: Secondary | ICD-10-CM

## 2022-12-26 DIAGNOSIS — I10 Essential (primary) hypertension: Secondary | ICD-10-CM | POA: Insufficient documentation

## 2022-12-26 MED ORDER — BLOOD PRESSURE CUFF MISC: 0 refills | Status: AC

## 2022-12-26 NOTE — Progress Notes (Signed)
Subjective:    CC: Annual Physical Exam  HPI:  This patient is here for their annual physical  I reviewed the past medical history, family history, social history, surgical history, and allergies today and no changes were needed.  Please see the problem list section below in epic for further details.  Past Medical History: Past Medical History:  Diagnosis Date   ED (erectile dysfunction)    Hyperlipidemia    PONV (postoperative nausea and vomiting)    Past Surgical History: Past Surgical History:  Procedure Laterality Date   ac separation     BACK SURGERY     IR ANGIOGRAM PULMONARY BILATERAL SELECTIVE  10/25/2021   IR ANGIOGRAM SELECTIVE EACH ADDITIONAL VESSEL  10/25/2021   IR ANGIOGRAM SELECTIVE EACH ADDITIONAL VESSEL  10/25/2021   IR THROMBECT PRIM MECH ADD (INCLU) MOD SED  10/25/2021   IR THROMBECT PRIM MECH INIT (INCLU) MOD SED  10/25/2021   IR US GUIDE VASC ACCESS RIGHT  10/25/2021   LUMBAR LAMINECTOMY/DECOMPRESSION MICRODISCECTOMY Bilateral 10/19/2021   Procedure: Bilateral Lumbar four-five  Microdiscectomy;  Surgeon: Barnett Abu, MD;  Location: [facility] OR;  Service: Neurosurgery;  Laterality: Bilateral;   SHOULDER SURGERY     Social History: Social History   Socioeconomic History   Marital status: Married    Spouse name: Not on file   Number of children: Not on file   Years of education: Not on file   Highest education level: Associate degree: occupational, Scientist, product/process development, or vocational program  Occupational History   Not on file  Tobacco Use   Smoking status: Former    Current packs/day: 0.00    Average packs/day: 2.0 packs/day for 4.0 years (8.0 ttl pk-yrs)    Types: Cigarettes    Start date: 04/01/1984    Quit date: 04/01/1988    Years since quitting: 34.7   Smokeless tobacco: Never  Vaping Use   Vaping status: Never Used  Substance and Sexual Activity   Alcohol use: Yes    Alcohol/week: 12.0 standard drinks of alcohol    Types: 12 Cans of beer per week    Comment:  weekly   Drug use: No   Sexual activity: Not on file  Other Topics Concern   Not on file  Social History Narrative   Not on file   Social Determinants of Health   Financial Resource Strain: Low Risk  (12/26/2022)   Overall Financial Resource Strain (CARDIA)    Difficulty of Paying Living Expenses: Not hard at all  Food Insecurity: No Food Insecurity (12/26/2022)   Hunger Vital Sign    Worried About Running Out of Food in the Last Year: Never true    Ran Out of Food in the Last Year: Never true  Transportation Needs: No Transportation Needs (12/26/2022)   PRAPARE - Administrator, Civil Service (Medical): No    Lack of Transportation (Non-Medical): No  Physical Activity: Sufficiently Active (12/26/2022)   Exercise Vital Sign    Days of Exercise per Week: 3 days    Minutes of Exercise per Session: 130 min  Stress: Stress Concern Present (12/26/2022)   [employer] of Occupational Health - Occupational Stress Questionnaire    Feeling of Stress : To some extent  Social Connections: Unknown (12/26/2022)   Social Connection and Isolation Panel [NHANES]    Frequency of Communication with Friends and Family: Three times a week    Frequency of Social Gatherings with Friends and Family: Once a week    Attends Religious Services:  Patient declined    Active Member of Clubs or Organizations: No    Attends Engineer, structural: Not on file    Marital Status: Married   Family History: Family History  Problem Relation Age of Onset   Healthy Mother    Allergies: No Known Allergies Medications: See med rec.  Review of Systems: No headache, visual changes, nausea, vomiting, diarrhea, constipation, dizziness, abdominal pain, skin rash, fevers, chills, night sweats, swollen lymph nodes, weight loss, chest pain, body aches, joint swelling, muscle aches, shortness of breath, mood changes, visual or auditory hallucinations.  Objective:    General: Well Developed, well  nourished, and in no acute distress.  Neuro: Alert and oriented x3, extra-ocular muscles intact, sensation grossly intact. Cranial nerves II through XII are intact, motor, sensory, and coordinative functions are all intact. HEENT: Normocephalic, atraumatic, pupils equal round reactive to light, neck supple, no masses, no lymphadenopathy, thyroid nonpalpable. Oropharynx, nasopharynx, external ear canals are unremarkable. Skin: Warm and dry, no rashes noted.  Cardiac: Regular rate and rhythm, no murmurs rubs or gallops.  Respiratory: Clear to auscultation bilaterally. Not using accessory muscles, speaking in full sentences.  Abdominal: Soft, nontender, nondistended, positive bowel sounds, no masses, no organomegaly.  Musculoskeletal: Shoulder, elbow, wrist, hip, knee, ankle stable, and with full range of motion.  Impression and Recommendations:    The patient was counselled, risk factors were discussed, anticipatory guidance given.  Annual physical exam Annual physical as above, ordering routine labs. Cologuard normal this year, due again in 2027. Return in 1 year for the next fasting annual physical.  Chronic anticoagulation Sean Serrano is on chronic anticoagulation, he does have a history of a pulmonary embolism following back surgery, but he did have a history of DVTs prior. He has worked with a Acupuncturist, hypercoagulable workup was negative with the exception of slight abnormality in beta-2 microglobulin. He has been managed on Xarelto. We can continue this lifelong.   Elevated blood pressure reading Sean Serrano does not have a history of hypertension, he has had a couple of elevated blood pressure readings here in the office, they have remained elevated on recheck. We will send him home with a blood pressure cuff prescription, he will check his BPs at home after sitting relaxed for 15 minutes for about 2 weeks and then MyChart me the  numbers.    ____________________________________________ Sean Serrano. Benjamin Stain, M.D., ABFM., CAQSM., AME. Primary Care and Sports Medicine [place] [facility]  Adjunct Professor of Family Medicine  [employer] [employer]  Restaurant manager, fast food

## 2022-12-26 NOTE — Assessment & Plan Note (Signed)
Annual physical as above, ordering routine labs. Cologuard normal this year, due again in 2027. Return in 1 year for the next fasting annual physical.

## 2022-12-26 NOTE — Assessment & Plan Note (Signed)
Sean Serrano is on chronic anticoagulation, he does have a history of a pulmonary embolism following back surgery, but he did have a history of DVTs prior. He has worked with a Acupuncturist, hypercoagulable workup was negative with the exception of slight abnormality in beta-2 microglobulin. He has been managed on Xarelto. We can continue this lifelong.

## 2022-12-26 NOTE — Assessment & Plan Note (Signed)
Sean Serrano does not have a history of hypertension, he has had a couple of elevated blood pressure readings here in the office, they have remained elevated on recheck. We will send him home with a blood pressure cuff prescription, he will check his BPs at home after sitting relaxed for 15 minutes for about 2 weeks and then MyChart me the numbers.

## 2023-01-02 DIAGNOSIS — N139 Obstructive and reflux uropathy, unspecified: Secondary | ICD-10-CM | POA: Diagnosis not present

## 2023-01-02 DIAGNOSIS — E785 Hyperlipidemia, unspecified: Secondary | ICD-10-CM | POA: Diagnosis not present

## 2023-01-03 LAB — CBC
Hematocrit: 47.6 % (ref 37.5–51.0)
Hemoglobin: 16.1 g/dL (ref 13.0–17.7)
MCH: 32.5 pg (ref 26.6–33.0)
MCHC: 33.8 g/dL (ref 31.5–35.7)
MCV: 96 fL (ref 79–97)
Platelets: 200 10*3/uL (ref 150–450)
RBC: 4.96 x10E6/uL (ref 4.14–5.80)
RDW: 12.7 % (ref 11.6–15.4)
WBC: 5.5 10*3/uL (ref 3.4–10.8)

## 2023-01-03 LAB — PSA, TOTAL AND FREE
PSA, Free Pct: 42.5 %
PSA, Free: 0.34 ng/mL
Prostate Specific Ag, Serum: 0.8 ng/mL (ref 0.0–4.0)

## 2023-01-03 LAB — COMPREHENSIVE METABOLIC PANEL
ALT: 30 [IU]/L (ref 0–44)
AST: 22 [IU]/L (ref 0–40)
Albumin: 4.7 g/dL (ref 3.8–4.9)
Alkaline Phosphatase: 41 [IU]/L — ABNORMAL LOW (ref 44–121)
BUN/Creatinine Ratio: 19 (ref 9–20)
BUN: 19 mg/dL (ref 6–24)
Bilirubin Total: 0.5 mg/dL (ref 0.0–1.2)
CO2: 20 mmol/L (ref 20–29)
Calcium: 9.4 mg/dL (ref 8.7–10.2)
Chloride: 103 mmol/L (ref 96–106)
Creatinine, Ser: 1.01 mg/dL (ref 0.76–1.27)
Globulin, Total: 2.2 g/dL (ref 1.5–4.5)
Glucose: 90 mg/dL (ref 70–99)
Potassium: 4.6 mmol/L (ref 3.5–5.2)
Sodium: 139 mmol/L (ref 134–144)
Total Protein: 6.9 g/dL (ref 6.0–8.5)
eGFR: 88 mL/min/{1.73_m2} (ref >59)

## 2023-01-03 LAB — LIPID PANEL
Chol/HDL Ratio: 3.4 {ratio} (ref 0.0–5.0)
Cholesterol, Total: 188 mg/dL (ref 100–199)
HDL: 56 mg/dL (ref >39)
LDL Chol Calc (NIH): 115 mg/dL — ABNORMAL HIGH (ref 0–99)
Triglycerides: 96 mg/dL (ref 0–149)
VLDL Cholesterol Cal: 17 mg/dL (ref 5–40)

## 2023-01-03 LAB — HEMOGLOBIN A1C
Est. average glucose Bld gHb Est-mCnc: 103 mg/dL
Hgb A1c MFr Bld: 5.2 % (ref 4.8–5.6)

## 2023-01-03 LAB — TSH: TSH: 2.76 u[IU]/mL (ref 0.450–4.500)

## 2023-01-25 ENCOUNTER — Other Ambulatory Visit: Payer: Self-pay | Admitting: Sports Medicine

## 2023-01-25 DIAGNOSIS — E785 Hyperlipidemia, unspecified: Secondary | ICD-10-CM

## 2023-01-28 ENCOUNTER — Encounter: Payer: Self-pay | Admitting: Sports Medicine

## 2023-01-28 DIAGNOSIS — I10 Essential (primary) hypertension: Secondary | ICD-10-CM

## 2023-01-30 MED ORDER — AMLODIPINE BESYLATE 5 MG PO TABS
5.0000 mg | ORAL_TABLET | Freq: Every day | ORAL | 11 refills | Status: DC
Start: 2023-01-30 — End: 2023-02-24

## 2023-01-30 NOTE — Assessment & Plan Note (Addendum)
Continued elevated blood pressure, adding amlodipine, patient will send me logs in 2 more weeks.  Update: Continue elevated blood pressure, increasing amlodipine to 10 mg daily with another 2-week home blood pressure diary check.

## 2023-02-14 ENCOUNTER — Ambulatory Visit: Payer: BC Managed Care – PPO

## 2023-02-14 ENCOUNTER — Other Ambulatory Visit: Payer: Self-pay

## 2023-02-14 ENCOUNTER — Ambulatory Visit: Payer: BC Managed Care – PPO | Admitting: Podiatry

## 2023-02-14 ENCOUNTER — Encounter: Payer: Self-pay | Admitting: Podiatry

## 2023-02-14 DIAGNOSIS — M7732 Calcaneal spur, left foot: Secondary | ICD-10-CM | POA: Diagnosis not present

## 2023-02-14 DIAGNOSIS — M79672 Pain in left foot: Secondary | ICD-10-CM

## 2023-02-14 DIAGNOSIS — M19072 Primary osteoarthritis, left ankle and foot: Secondary | ICD-10-CM

## 2023-02-14 DIAGNOSIS — M722 Plantar fascial fibromatosis: Secondary | ICD-10-CM | POA: Diagnosis not present

## 2023-02-14 MED ORDER — TRIAMCINOLONE ACETONIDE 10 MG/ML IJ SUSP
2.5000 mg | Freq: Once | INTRAMUSCULAR | Status: AC
Start: 2023-02-14 — End: 2023-02-14
  Administered 2023-02-14: 2.5 mg via INTRA_ARTICULAR

## 2023-02-14 MED ORDER — DEXAMETHASONE SODIUM PHOSPHATE 120 MG/30ML IJ SOLN
4.0000 mg | Freq: Once | INTRAMUSCULAR | Status: AC
Start: 2023-02-14 — End: 2023-02-14
  Administered 2023-02-14: 4 mg via INTRA_ARTICULAR

## 2023-02-14 MED ORDER — MELOXICAM 15 MG PO TABS: 15.0000 mg | ORAL_TABLET | Freq: Every day | ORAL | 0 refills | Status: DC

## 2023-02-14 NOTE — Progress Notes (Signed)
  Subjective:  Patient ID: Sean Serrano, male    DOB: [birth year],   MRN: [id number]  No chief complaint on file.   52 y.o. male presents for concern of left heel pain that started about three weeks ago. Relates years ago he was mountain biking and fell and something similar happened and it got better. Relates he did recently fall off his bike again and not sure if related. He does a lot of hiking. Relates most pain after being on feet and hiking for a while in the bottom of his heel. Has tried supportive shoes and some stretching  . Denies any other pedal complaints. Denies n/v/f/c.   Past Medical History:  Diagnosis Date   ED (erectile dysfunction)    Hyperlipidemia    PONV (postoperative nausea and vomiting)     Objective:  Physical Exam: Vascular: DP/PT pulses 2/4 bilateral. CFT <3 seconds. Normal hair growth on digits. No edema.  Skin. No lacerations or abrasions bilateral feet.  Musculoskeletal: MMT 5/5 bilateral lower extremities in DF, PF, Inversion and Eversion. Deceased ROM in DF of ankle joint. Tender to the medial calcaneal tubercle left . No pain with achilles, PT or arch. No pain with calcaneal squeeze.   Neurological: Sensation intact to light touch.   Assessment:   1. Plantar fasciitis, left      Plan:  Patient was evaluated and treated and all questions answered. Discussed plantar fasciitis with patient.  X-rays reviewed and discussed with patient. No acute fractures or dislocations noted. Mild spurring noted at inferior calcaneus. 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  Discussed treatment options including, ice, NSAIDS, supportive shoes, bracing, and stretching. Stretching exercises provided to be done on a daily basis.   Prescription for meloxicam provided and sent to pharmacy. Previous CMP wnl.  Patient requesting injection today. Procedure note below.   PF brace disepensed.  Follow-up 6 weeks or sooner if any problems  arise. In the meantime, encouraged to call the office with any questions, concerns, change in symptoms.   Procedure:  Discussed etiology, pathology, conservative vs. surgical therapies. At this time a plantar fascial injection was recommended.  The patient agreed and a sterile skin prep was applied.  An injection consisting of  1cc dexamethasone 0.5 cc kenalog and 1cc marcaine mixture was infiltrated at the point of maximal tenderness on the left Heel.  Bandaid applied. The patient tolerated this well and was given instructions for aftercare.    Louann Sjogren, DPM

## 2023-02-14 NOTE — Patient Instructions (Signed)

## 2023-02-24 DIAGNOSIS — S6702XA Crushing injury of left thumb, initial encounter: Secondary | ICD-10-CM | POA: Diagnosis not present

## 2023-02-24 MED ORDER — AMLODIPINE BESYLATE 10 MG PO TABS
10.0000 mg | ORAL_TABLET | Freq: Every day | ORAL | 11 refills | Status: DC
Start: 2023-02-24 — End: 2024-01-07

## 2023-02-24 NOTE — Addendum Note (Signed)
Addended by: Monica Becton on: 02/24/2023 02:13 PM   Modules accepted: Orders

## 2023-03-28 ENCOUNTER — Ambulatory Visit: Payer: BC Managed Care – PPO | Admitting: Podiatry

## 2023-03-28 ENCOUNTER — Encounter: Payer: Self-pay | Admitting: Podiatry

## 2023-03-28 DIAGNOSIS — M722 Plantar fascial fibromatosis: Secondary | ICD-10-CM | POA: Diagnosis not present

## 2023-03-28 MED ORDER — TRIAMCINOLONE ACETONIDE 10 MG/ML IJ SUSP
2.5000 mg | Freq: Once | INTRAMUSCULAR | Status: AC
Start: 2023-03-28 — End: 2023-03-28
  Administered 2023-03-28: 2.5 mg via INTRA_ARTICULAR

## 2023-03-28 MED ORDER — DEXAMETHASONE SODIUM PHOSPHATE 120 MG/30ML IJ SOLN
4.0000 mg | Freq: Once | INTRAMUSCULAR | Status: AC
Start: 2023-03-28 — End: 2023-03-28
  Administered 2023-03-28: 4 mg via INTRA_ARTICULAR

## 2023-03-28 NOTE — Progress Notes (Signed)
  Subjective:  Patient ID: Sean Serrano, male    DOB: [birth year],   MRN: [id number]  Chief Complaint  Patient presents with   Plantar Fasciitis    Pt presents for pf follow up states he is doing well.    52 y.o. male presents for follow-up of left plantar fasciits. Relates overall doing much better. Pain had gone away did notice this past week it starting to creep back in and was concerned and kept his follow-up. . Denies any other pedal complaints. Denies n/v/f/c.   Past Medical History:  Diagnosis Date   ED (erectile dysfunction)    Hyperlipidemia    PONV (postoperative nausea and vomiting)     Objective:  Physical Exam: Vascular: DP/PT pulses 2/4 bilateral. CFT <3 seconds. Normal hair growth on digits. No edema.  Skin. No lacerations or abrasions bilateral feet.  Musculoskeletal: MMT 5/5 bilateral lower extremities in DF, PF, Inversion and Eversion. Deceased ROM in DF of ankle joint. Minimally tender to the medial calcaneal tubercle left . No pain with achilles, PT or arch. No pain with calcaneal squeeze.   Neurological: Sensation intact to light touch.   Assessment:   1. Plantar fasciitis, left      Plan:  Patient was evaluated and treated and all questions answered. Discussed plantar fasciitis with patient.  X-rays reviewed and discussed with patient. No acute fractures or dislocations noted. Mild spurring noted at inferior calcaneus. 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  Discussed treatment options including, ice, NSAIDS, supportive shoes, bracing, and stretching. Continue brace and stretching.  Patient requesting injection today. Procedure note below.   Follow-up as needed  Procedure:  Discussed etiology, pathology, conservative vs. surgical therapies. At this time a plantar fascial injection was recommended.  The patient agreed and a sterile skin prep was applied.  An injection consisting of  1cc dexamethasone 0.5 cc kenalog  and 1cc marcaine mixture was infiltrated at the point of maximal tenderness on the left Heel.  Bandaid applied. The patient tolerated this well and was given instructions for aftercare.    Louann Sjogren, DPM

## 2023-06-24 ENCOUNTER — Ambulatory Visit
Admission: EM | Admit: 2023-06-24 | Discharge: 2023-06-24 | Disposition: A | Discharge status: Home / Self Care | Attending: Family Medicine | Admitting: Family Medicine

## 2023-06-24 ENCOUNTER — Other Ambulatory Visit: Payer: Self-pay

## 2023-06-24 DIAGNOSIS — M5412 Radiculopathy, cervical region: Secondary | ICD-10-CM

## 2023-06-24 DIAGNOSIS — R002 Palpitations: Secondary | ICD-10-CM

## 2023-06-24 HISTORY — DX: Other pulmonary embolism without acute cor pulmonale: I26.99

## 2023-06-24 NOTE — ED Provider Notes (Signed)
 [name]    CSN: [id number] Arrival date & time: 06/24/23  1342      History   Chief Complaint Chief Complaint  Patient presents with   Chest Pain    HPI Sean Serrano is a 53 y.o. male.   Patient is here with a couple of different issues.  First she has had some slight pressure sensation in his chest and a feeling that his heart is pounding.  This was worse yesterday but is absent today.  He does not have any history of heart disease.  Pain is not from exertion.  No radiation of pain.  Heart was pounding when he tried to sleep last night.  He could hear his heartbeat on his pillow.  He states that his heart did not feel like it was going fast, just that it was pounding "hard". In addition has some tension and pain in the left neck that radiates down the back of his arm to the elbow.  Combination of the pounding of his heart, chest pressure, and arm pain May to be concerned about heart disease.  He is here for evaluation.  He is advised that to rule out any significant cardiac condition he needs to be seen in the emergency room.  Patient acknowledges He does have a history of a DVT and pulmonary embolus.  He is on anticoagulant    Past Medical History:  Diagnosis Date   ED (erectile dysfunction)    Hyperlipidemia    PONV (postoperative nausea and vomiting)    Pulmonary embolus [facility])     Patient Active Problem List   Diagnosis Date Noted   Benign essential hypertension 12/26/2022   Insomnia 12/11/2021   Primary hypercoagulable state (HCC) 11/16/2021   Family history of pulmonary embolism 11/16/2021   Chronic anticoagulation 11/16/2021   Pulmonary embolism (HCC) 10/25/2021   Herniated nucleus pulposus, L4-5 10/19/2021   Lumbar herniated disc 10/19/2021   Cauda equina syndrome (HCC) 10/04/2021   Bilateral wrist pain 12/11/2020   Hearing loss due to cerumen impaction, left 12/11/2020   Acute deep vein thrombosis (DVT) of calf muscle vein of right lower  extremity (HCC) 04/13/2018   Erectile dysfunction 12/03/2016   Hyperlipidemia LDL goal <160 12/03/2016   Annual physical exam 09/14/2013   Obstructive uropathy 09/14/2013    Past Surgical History:  Procedure Laterality Date   ac separation     BACK SURGERY     IR ANGIOGRAM PULMONARY BILATERAL SELECTIVE  10/25/2021   IR ANGIOGRAM SELECTIVE EACH ADDITIONAL VESSEL  10/25/2021   IR ANGIOGRAM SELECTIVE EACH ADDITIONAL VESSEL  10/25/2021   IR THROMBECT PRIM MECH ADD (INCLU) MOD SED  10/25/2021   IR THROMBECT PRIM MECH INIT (INCLU) MOD SED  10/25/2021   IR US GUIDE VASC ACCESS RIGHT  10/25/2021   LUMBAR LAMINECTOMY/DECOMPRESSION MICRODISCECTOMY Bilateral 10/19/2021   Procedure: Bilateral Lumbar four-five  Microdiscectomy;  Surgeon: Barnett Abu, MD;  Location: [facility] OR;  Service: Neurosurgery;  Laterality: Bilateral;   SHOULDER SURGERY         Home Medications    Prior to Admission medications   Medication Sig Start Date End Date Taking? Authorizing Provider  acetaminophen (TYLENOL) 650 MG CR tablet Take 1 tablet (650 mg total) by mouth in the morning and at bedtime. 12/11/21   Monica Becton, MD  amLODipine (NORVASC) 10 MG tablet Take 1 tablet (10 mg total) by mouth daily. 02/24/23   Monica Becton, MD  atorvastatin (LIPITOR) 10 MG tablet TAKE 1 TABLET  BY MOUTH EVERY DAY 01/25/23   Monica Becton, MD  Blood Pressure Monitoring (BLOOD PRESSURE CUFF) MISC Automatic BP cuff, please check twice a day after sitting relaxed for 15 minutes 12/26/22   Monica Becton, MD  famotidine (PEPCID) 10 MG tablet Take 10 mg by mouth daily as needed for heartburn.    [provider]  Rivaroxaban (XARELTO) 15 MG TABS tablet Take 1 tablet (15 mg total) by mouth daily. 07/19/22 07/14/23  Monica Becton, MD  tadalafil (CIALIS) 5 MG tablet Take 1 tablet by mouth once daily 11/25/22   Monica Becton, MD    Family History Family History  Problem Relation Age of  Onset   Healthy Mother     Social History Social History   Tobacco Use   Smoking status: Former    Current packs/day: 0.00    Average packs/day: 2.0 packs/day for 4.0 years (8.0 ttl pk-yrs)    Types: Cigarettes    Start date: 04/01/1984    Quit date: 04/01/1988    Years since quitting: 35.2   Smokeless tobacco: Never  Vaping Use   Vaping status: Never Used  Substance Use Topics   Alcohol use: Yes    Alcohol/week: 12.0 standard drinks of alcohol    Types: 12 Cans of beer per week    Comment: weekly   Drug use: No     Allergies   Patient has no known allergies.   Review of Systems Review of Systems See HPI  Physical Exam Triage Vital Signs ED Triage Vitals  Encounter Vitals Group     BP 06/24/23 1349 (!) 150/82     Systolic BP Percentile --      Diastolic BP Percentile --      Pulse Rate 06/24/23 1349 80     Resp 06/24/23 1349 18     Temp 06/24/23 1349 97.8 F (36.6 C)     Temp src --      SpO2 06/24/23 1349 96 %     Weight --      Height --      Head Circumference --      Peak Flow --      Pain Score 06/24/23 1352 4     Pain Loc --      Pain Education --      Exclude from Growth Chart --    No data found.  Updated Vital Signs BP (!) 150/82   Pulse 80   Temp 97.8 F (36.6 C)   Resp 18   SpO2 96%       Physical Exam Constitutional:      General: He is not in acute distress.    Appearance: He is well-developed and normal weight. He is not ill-appearing.  HENT:     Head: Normocephalic and atraumatic.  Eyes:     Conjunctiva/sclera: Conjunctivae normal.     Pupils: Pupils are equal, round, and reactive to light.  Cardiovascular:     Rate and Rhythm: Normal rate and regular rhythm. No extrasystoles are present.    Heart sounds: Normal heart sounds.  Pulmonary:     Effort: Pulmonary effort is normal. No respiratory distress.     Breath sounds: Normal breath sounds.  Abdominal:     General: Bowel sounds are normal. There is no distension.      Palpations: Abdomen is soft. There is no hepatomegaly or splenomegaly.  Musculoskeletal:        General: Normal range of motion.  Cervical back: Normal range of motion.     Right lower leg: No edema.     Left lower leg: No edema.     Comments: Tenderness in the left upper body of the trapezius muscle and increased muscle tone.  Full range of motion of neck.  Spurling's is positive.  Strength, sensation, range of motion, reflexes intact in both upper extremities  Skin:    General: Skin is warm and dry.  Neurological:     Mental Status: He is alert.      UC Treatments / Results  Labs (all labs ordered are listed, but only abnormal results are displayed) Labs Reviewed - No data to display  EKG-normal sinus rhythm.  No ST or T wave changes.  Normal EKG   Radiology No results found.  Procedures Procedures (including critical care time)  Medications Ordered in UC Medications - No data to display  Initial Impression / Assessment and Plan / UC Course  I have reviewed the triage vital signs and the nursing notes.  Pertinent labs & imaging results that were available during my care of the patient were reviewed by me and considered in my medical decision making (see chart for details).     Discussed with patient that the pain in his left shoulder going down his arms is likely a cervical radiculopathy. He describes a pounding in his chest although his heart rate is normal.  EKG is normal.  He needs to follow-up with his primary care doctor. Final Clinical Impressions(s) / UC Diagnoses   Final diagnoses:  Cervical radiculitis  Palpitations     Discharge Instructions      Limit caffeine if you are having palpitations May take tylenol for pain Call if the pinched nerve pain persists and you need prednisone Follow up with Dr Benjamin Stain   ED Prescriptions   None    PDMP not reviewed this encounter.   Eustace Moore, MD 06/24/23 [phone]

## 2023-06-24 NOTE — ED Triage Notes (Addendum)
 Chest tightness and left arm pain since 1230 yesterday. Felt like heart was beating hard and had pressure in face. Currently has pain to shoulderblade going into left arm. Feels occasional palpitation. Did have nausea shortly yesterday but none today. No vomiting or diaphoresis. Pain is 3-4/10 right now. No otc meds. Is on cialis.

## 2023-06-24 NOTE — Discharge Instructions (Signed)
 Limit caffeine if you are having palpitations May take tylenol for pain Call if the pinched nerve pain persists and you need prednisone Follow up with Dr Benjamin Stain

## 2023-08-07 ENCOUNTER — Other Ambulatory Visit: Payer: Self-pay | Admitting: Sports Medicine

## 2023-08-07 DIAGNOSIS — Z7901 Long term (current) use of anticoagulants: Secondary | ICD-10-CM

## 2023-09-08 ENCOUNTER — Ambulatory Visit
Admission: EM | Admit: 2023-09-08 | Discharge: 2023-09-08 | Disposition: A | Discharge status: Home / Self Care | Attending: Family Medicine | Admitting: Family Medicine

## 2023-09-08 DIAGNOSIS — R059 Cough, unspecified: Secondary | ICD-10-CM

## 2023-09-08 DIAGNOSIS — H109 Unspecified conjunctivitis: Secondary | ICD-10-CM | POA: Diagnosis not present

## 2023-09-08 DIAGNOSIS — J069 Acute upper respiratory infection, unspecified: Secondary | ICD-10-CM | POA: Diagnosis not present

## 2023-09-08 MED ORDER — AMOXICILLIN-POT CLAVULANATE 875-125 MG PO TABS: 1.0000 | ORAL_TABLET | Freq: Two times a day (BID) | ORAL | 0 refills | Status: DC

## 2023-09-08 MED ORDER — MOXIFLOXACIN HCL 0.5 % OP SOLN: 1.0000 [drp] | Freq: Three times a day (TID) | OPHTHALMIC | 0 refills | Status: AC

## 2023-09-08 MED ORDER — PREDNISONE 20 MG PO TABS: ORAL_TABLET | ORAL | 0 refills | Status: DC

## 2023-09-08 NOTE — ED Triage Notes (Signed)
 Pt c/o cough, runny nose and bilateral eye redness/drainage since Thurs evening. No known fever. OTC cough and cold prn.

## 2023-09-08 NOTE — Discharge Instructions (Addendum)
 Advised patient to take medications as directed with food to completion.  Advised patient to instill eyedrops bilaterally 3 times daily for the next 5 days.  Advised patient to take prednisone  with first dose of Augmentin  for the next 5 of 7 days.  Encouraged to increase daily water intake to 64 ounces per day while taking these medications.  Advised if symptoms worsen and/or unresolved please follow-up with your PCP, ophthalmology/optometry, or here for further evaluation.

## 2023-09-08 NOTE — ED Provider Notes (Signed)
 [facility]    CSN: [id number] Arrival date & time: 09/08/23  0800      History   Chief Complaint Chief Complaint  Patient presents with   Cough   Nasal Congestion    Runny nose   Eye Problem    bilateral    HPI Sean Serrano is a 53 y.o. male.   HPI Pleasant 53 year old male presents with cough, runny nose, bilateral eye redness/drainage for 4 days.  PMH significant for PE, HTN, and HLD.  Patient is currently on rivaroxaban  and denies any unusual bleeding.  Past Medical History:  Diagnosis Date   ED (erectile dysfunction)    Hyperlipidemia    PONV (postoperative nausea and vomiting)    Pulmonary embolus [facility])     Patient Active Problem List   Diagnosis Date Noted   Benign essential hypertension 12/26/2022   Insomnia 12/11/2021   Primary hypercoagulable state (HCC) 11/16/2021   Family history of pulmonary embolism 11/16/2021   Chronic anticoagulation 11/16/2021   Pulmonary embolism (HCC) 10/25/2021   Herniated nucleus pulposus, L4-5 10/19/2021   Lumbar herniated disc 10/19/2021   Cauda equina syndrome (HCC) 10/04/2021   Bilateral wrist pain 12/11/2020   Hearing loss due to cerumen impaction, left 12/11/2020   Acute deep vein thrombosis (DVT) of calf muscle vein of right lower extremity (HCC) 04/13/2018   Erectile dysfunction 12/03/2016   Hyperlipidemia LDL goal <160 12/03/2016   Annual physical exam 09/14/2013   Obstructive uropathy 09/14/2013    Past Surgical History:  Procedure Laterality Date   ac separation     BACK SURGERY     IR ANGIOGRAM PULMONARY BILATERAL SELECTIVE  10/25/2021   IR ANGIOGRAM SELECTIVE EACH ADDITIONAL VESSEL  10/25/2021   IR ANGIOGRAM SELECTIVE EACH ADDITIONAL VESSEL  10/25/2021   IR THROMBECT PRIM MECH ADD (INCLU) MOD SED  10/25/2021   IR THROMBECT PRIM MECH INIT (INCLU) MOD SED  10/25/2021   IR US  GUIDE VASC ACCESS RIGHT  10/25/2021   LUMBAR LAMINECTOMY/DECOMPRESSION MICRODISCECTOMY Bilateral 10/19/2021   Procedure:  Bilateral Lumbar four-five  Microdiscectomy;  Surgeon: Elna Haggis, MD;  Location: [facility] OR;  Service: Neurosurgery;  Laterality: Bilateral;   SHOULDER SURGERY         Home Medications    Prior to Admission medications   Medication Sig Start Date End Date Taking? Authorizing Provider  amoxicillin -clavulanate (AUGMENTIN ) 875-125 MG tablet Take 1 tablet by mouth every 12 (twelve) hours. 09/08/23  Yes Leonides Ramp, FNP  moxifloxacin (VIGAMOX) 0.5 % ophthalmic solution Place 1 drop into both eyes 3 (three) times daily for 5 days. 09/08/23 09/13/23 Yes Leonides Ramp, FNP  predniSONE  (DELTASONE ) 20 MG tablet Take 3 tabs PO daily x 5 days. 09/08/23  Yes Leonides Ramp, FNP  acetaminophen  (TYLENOL ) 650 MG CR tablet Take 1 tablet (650 mg total) by mouth in the morning and at bedtime. 12/11/21   Gean Keels, MD  amLODipine  (NORVASC ) 10 MG tablet Take 1 tablet (10 mg total) by mouth daily. 02/24/23   Gean Keels, MD  atorvastatin  (LIPITOR) 10 MG tablet TAKE 1 TABLET BY MOUTH EVERY DAY 01/25/23   Gean Keels, MD  Blood Pressure Monitoring (BLOOD PRESSURE CUFF) MISC Automatic BP cuff, please check twice a day after sitting relaxed for 15 minutes 12/26/22   Gean Keels, MD  famotidine  (PEPCID ) 10 MG tablet Take 10 mg by mouth daily as needed for heartburn.    [provider]  Rivaroxaban  (XARELTO ) 15 MG TABS tablet Take 1 tablet (  15 mg total) by mouth daily. NEEDS APPOINTMENT FOR FURTHER REFILLS. 08/07/23   Gean Keels, MD  tadalafil  (CIALIS ) 5 MG tablet Take 1 tablet by mouth once daily 11/25/22   Gean Keels, MD    Family History Family History  Problem Relation Age of Onset   Healthy Mother     Social History Social History   Tobacco Use   Smoking status: Former    Current packs/day: 0.00    Average packs/day: 2.0 packs/day for 4.0 years (8.0 ttl pk-yrs)    Types: Cigarettes    Start date: 04/01/1984    Quit date: 04/01/1988     Years since quitting: 35.4   Smokeless tobacco: Never  Vaping Use   Vaping status: Never Used  Substance Use Topics   Alcohol use: Yes    Alcohol/week: 12.0 standard drinks of alcohol    Types: 12 Cans of beer per week    Comment: weekly   Drug use: No     Allergies   Patient has no known allergies.   Review of Systems Review of Systems  HENT:  Positive for congestion and rhinorrhea.   Eyes:  Positive for redness.  Respiratory:  Positive for cough.   All other systems reviewed and are negative.    Physical Exam Triage Vital Signs ED Triage Vitals  Encounter Vitals Group     BP 09/08/23 0812 (!) 140/90     Systolic BP Percentile --      Diastolic BP Percentile --      Pulse Rate 09/08/23 0812 85     Resp 09/08/23 0812 17     Temp 09/08/23 0812 98.3 F (36.8 C)     Temp Source 09/08/23 0812 Oral     SpO2 09/08/23 0812 96 %     Weight --      Height --      Head Circumference --      Peak Flow --      Pain Score 09/08/23 0813 0     Pain Loc --      Pain Education --      Exclude from Growth Chart --    No data found.  Updated Vital Signs BP (!) 140/90 (BP Location: Right Arm)   Pulse 85   Temp 98.3 F (36.8 C) (Oral)   Resp 17   SpO2 96%    Physical Exam Vitals and nursing note reviewed.  Constitutional:      Appearance: Normal appearance. He is normal weight. He is ill-appearing.  HENT:     Head: Normocephalic and atraumatic.     Right Ear: Tympanic membrane and external ear normal.     Left Ear: Tympanic membrane and external ear normal.     Ears:     Comments: Moderate eustachian tube dysfunction noted bilaterally; right TM: Red rimmed, retracted    Mouth/Throat:     Mouth: Mucous membranes are moist.     Pharynx: Oropharynx is clear.  Eyes:     Extraocular Movements: Extraocular movements intact.     Conjunctiva/sclera: Conjunctivae normal.     Pupils: Pupils are equal, round, and reactive to light.     Comments: Bilateral sclera with +3  injection  Cardiovascular:     Rate and Rhythm: Normal rate and regular rhythm.     Pulses: Normal pulses.     Heart sounds: Normal heart sounds.  Pulmonary:     Effort: Pulmonary effort is normal.     Breath sounds: Normal  breath sounds. No wheezing, rhonchi or rales.     Comments: Infrequent nonproductive cough on exam Musculoskeletal:        General: Normal range of motion.  Skin:    General: Skin is warm and dry.  Neurological:     General: No focal deficit present.     Mental Status: He is alert and oriented to person, place, and time. Mental status is at baseline.  Psychiatric:        Mood and Affect: Mood normal.        Behavior: Behavior normal.      UC Treatments / Results  Labs (all labs ordered are listed, but only abnormal results are displayed) Labs Reviewed - No data to display  EKG   Radiology No results found.  Procedures Procedures (including critical care time)  Medications Ordered in UC Medications - No data to display  Initial Impression / Assessment and Plan / UC Course  I have reviewed the triage vital signs and the nursing notes.  Pertinent labs & imaging results that were available during my care of the patient were reviewed by me and considered in my medical decision making (see chart for details).     MDM: 1.  Acute URI-Rx'd Augmentin  875/125 mg tablet: Take 1 tablet twice daily x 7 days; 2.  Cough, unspecified type-Rx'd prednisone  20 mg tablet: Take 3 tablets p.o. daily x 5 days; 3.  Conjunctivitis of both eyes, unspecified conjunctivitis type-Rx'd Vigamox 0.5% ophthalmic solution: Place 1 drop into both eyes 3 times daily x 5 days. Advised patient to take medications as directed with food to completion.  Advised patient to instill eyedrops bilaterally 3 times daily for the next 5 days.  Advised patient to take prednisone  with first dose of Augmentin  for the next 5 of 7 days.  Encouraged to increase daily water intake to 64 ounces per day  while taking these medications.  Advised if symptoms worsen and/or unresolved please follow-up with your PCP, ophthalmology/optometry, or here for further evaluation.  Patient discharged home, hemodynamically stable.  Work note provided to patient prior to discharge today. Final Clinical Impressions(s) / UC Diagnoses   Final diagnoses:  Acute URI  Cough, unspecified type  Conjunctivitis of both eyes, unspecified conjunctivitis type     Discharge Instructions      Advised patient to take medications as directed with food to completion.  Advised patient to instill eyedrops bilaterally 3 times daily for the next 5 days.  Advised patient to take prednisone  with first dose of Augmentin  for the next 5 of 7 days.  Encouraged to increase daily water intake to 64 ounces per day while taking these medications.  Advised if symptoms worsen and/or unresolved please follow-up with your PCP, ophthalmology/optometry, or here for further evaluation.   ED Prescriptions     Medication Sig Dispense Auth. Provider   amoxicillin -clavulanate (AUGMENTIN ) 875-125 MG tablet Take 1 tablet by mouth every 12 (twelve) hours. 14 tablet Shakirra Buehler, FNP   predniSONE  (DELTASONE ) 20 MG tablet Take 3 tabs PO daily x 5 days. 15 tablet Lyndy Russman, FNP   moxifloxacin (VIGAMOX) 0.5 % ophthalmic solution Place 1 drop into both eyes 3 (three) times daily for 5 days. 3 mL Mikayla Chiusano, FNP      PDMP not reviewed this encounter.   Leonides Ramp, FNP 09/08/23 [phone]

## 2023-10-09 ENCOUNTER — Telehealth: Payer: Self-pay

## 2023-10-09 NOTE — Telephone Encounter (Signed)
 Pt reports chest tightness and palpitations onset for 2/3 weeks pt reports no sob,  no dizziness, pt was calling to get get scheduled with pcp who is not available this week, pt has been scheduled to see jade breeback on 10/10/23 at 11:10 am.

## 2023-10-10 ENCOUNTER — Encounter: Payer: Self-pay | Admitting: Physician Assistant

## 2023-10-10 ENCOUNTER — Ambulatory Visit: Attending: Physician Assistant

## 2023-10-10 ENCOUNTER — Ambulatory Visit: Admitting: Physician Assistant

## 2023-10-10 VITALS — BP 125/75 | HR 66 | Ht 69.0 in | Wt 183.0 lb

## 2023-10-10 DIAGNOSIS — R002 Palpitations: Secondary | ICD-10-CM | POA: Diagnosis not present

## 2023-10-10 DIAGNOSIS — I10 Essential (primary) hypertension: Secondary | ICD-10-CM | POA: Diagnosis not present

## 2023-10-10 DIAGNOSIS — Z86718 Personal history of other venous thrombosis and embolism: Secondary | ICD-10-CM | POA: Insufficient documentation

## 2023-10-10 DIAGNOSIS — R079 Chest pain, unspecified: Secondary | ICD-10-CM

## 2023-10-10 DIAGNOSIS — G43109 Migraine with aura, not intractable, without status migrainosus: Secondary | ICD-10-CM | POA: Insufficient documentation

## 2023-10-10 DIAGNOSIS — R0789 Other chest pain: Secondary | ICD-10-CM | POA: Diagnosis not present

## 2023-10-10 MED ORDER — RIZATRIPTAN BENZOATE 10 MG PO TBDP: 10.0000 mg | ORAL_TABLET | ORAL | 1 refills | Status: DC | PRN

## 2023-10-10 NOTE — Patient Instructions (Signed)
 Use maxalt  at onset of migraine for rescue Start magnesium 400mg  at bedtime and can increase to twice a day to help with migraine prevention Consider SSRI for stress/anxiety Continue ashwaganda Will order zio patch to wear   .SABRAKeep headache diary and bring to your next appointment  Lifestyle changes to improve headache frequency:  Send- rescue and daily.   Regular sleep routine -- go to bed at the same time and get up at the same time-- everyday, even on the weekends Regular exercise-40 minutes 3-4 times weekly (150 minutes weekly) Maintaining a healthy weight--portion control Avoid caffeine. Decaf tea or coffee is ok. (limit to150 mg daily) Avoid soda (caffeine or non) Avoid artificial sweetners Avoid alcohol Avoid smoking/secondhand smoke Avoid ibuprofen , acetaminophen , naproxen , pseudoephedrine and other OTC analgesics Avoid opioids, narcotics and prescribed pain medication  Rebound Headaches/Medication overuse headache -- Overusing rescue medications more than twice weekly will worsen headache frequency and intensity over time. Will improve as you wean over the counter medications, ie-- Ibuprofen / Tylenol /Excedrin, etc. and caffeine- Headaches may get worse before getting better as you withdraw from medication overuse and/or caffeine   Start Magnesium 400-500mg  twice daily

## 2023-10-10 NOTE — Progress Notes (Signed)
 Acute Office Visit  Subjective:     Patient ID: Sean Serrano, male    DOB: 04/30/1970, 53 y.o.   MRN: [id number]  Chief Complaint  Patient presents with   Chest Pain    Chest Pain  Associated symptoms include headaches and palpitations. Pertinent negatives include no claudication, cough, diaphoresis, dizziness, fever, hemoptysis, nausea, orthopnea, shortness of breath, vomiting or weakness.   Patient is in today for chest tightness and palpitations that have been occurring intermittently for the past 5 weeks. He has a history of DVT/PE  (lifelong Xaralto- no skipped doses), HTN, HLD, lumbar herniated disc, and insomnia. Patient states that he has been experiencing this chest tightness and palpitations mostly when he is at rest or trying to sleep. Patient also states he has had 3 migraine attacks (with aura) this past week, which is unusual for him, as he has not had any frequent migraines in many years. He came in because he was concerned for a stroke or heart issue due to his complex history of PE/DVT.   He states he has been taking Boron, Ashwaghanda, Vitamin D , and a mult-vitamin for 6 weeks but was worried these may have caused his chest discomfort. Patient also admits to losing 15 pounds in the past 2+ months from intermittent fasting. Patient works a stressful job as a Merchandiser, retail and states that he has had increased stress within the last year and this event overall has increased his stress levels.   Patient states this uncomfortable feeling in his chest improves with physical activity (like mountain biking). He has not experienced any focal neurological deficits. Patient has been taking his medications regularly as prescribed and has not had any recent medication changes or additions. Initial EKG is unremarkable.   Review of Systems  Constitutional:  Negative for chills, diaphoresis and fever.  HENT:         Some pulsatile tinnitus. Patient has tinnitus at baseline, but has  noticed it has been worse with the palpitations/ he can 'hear his heart beat in his ears' occasionally.  Respiratory:  Negative for cough, hemoptysis and shortness of breath.   Cardiovascular:  Positive for chest pain and palpitations. Negative for orthopnea, claudication and leg swelling.       Chest pain/pressure improvement with physical activity/ exercise.   Gastrointestinal:  Negative for heartburn, nausea and vomiting.  Neurological:  Positive for headaches. Negative for dizziness, tingling, sensory change, speech change and weakness.       Has had 3 migraines in the past week. Hx of migraines, but has not had many attacks in the past few years, no known triggers.  All other systems reviewed and are negative.      Objective:    BP 125/75   Pulse 66   Ht 5' 9 (1.753 m)   Wt 183 lb (83 kg)   SpO2 99%   BMI 27.02 kg/m  BP Readings from Last 3 Encounters:  10/10/23 125/75  09/08/23 (!) 140/90  06/24/23 (!) 150/82   SpO2 Readings from Last 3 Encounters:  10/10/23 99%  09/08/23 96%  06/24/23 96%   ..    10/10/2023   11:57 AM 07/19/2022   10:53 AM 12/11/2021    8:44 AM 12/11/2021    8:43 AM 12/11/2020    3:02 PM  Depression screen PHQ 2/9  Decreased Interest 0 0 0 0 0  Down, Depressed, Hopeless 1 0 0 0 0  PHQ - 2 Score 1 0 0 0 0  Altered  sleeping 1    2  Tired, decreased energy 1    1  Change in appetite 0    0  Feeling bad or failure about yourself  0    0  Trouble concentrating 1    0  Moving slowly or fidgety/restless 0    0  Suicidal thoughts 0    0  PHQ-9 Score 4    3  Difficult doing work/chores Not difficult at all    Not difficult at all   .SABRA    10/10/2023   11:58 AM 12/11/2020    3:02 PM  GAD 7 : Generalized Anxiety Score  Nervous, Anxious, on Edge 1 0  Control/stop worrying 1 0  Worry too much - different things 1 0  Trouble relaxing 1 0  Restless 0 0  Easily annoyed or irritable 0 1  Afraid - awful might happen 0 0  Total GAD 7 Score 4 1  Anxiety  Difficulty Not difficult at all Not difficult at all     Physical Exam Vitals reviewed.  Constitutional:      Appearance: He is well-developed and normal weight.  Eyes:     Extraocular Movements: Extraocular movements intact.     Pupils: Pupils are equal, round, and reactive to light.  Cardiovascular:     Rate and Rhythm: Normal rate and regular rhythm.     Heart sounds: Normal heart sounds.     Comments: No carotid bruit. Pulmonary:     Effort: Pulmonary effort is normal.     Breath sounds: Normal breath sounds.  Chest:     Chest wall: No tenderness or crepitus.  Abdominal:     General: Bowel sounds are normal.     Palpations: Abdomen is soft.  Musculoskeletal:        General: Normal range of motion.     Cervical back: Normal range of motion.     Right lower leg: No edema.     Left lower leg: No edema.  Skin:    General: Skin is warm and dry.  Neurological:     General: No focal deficit present.     Mental Status: He is alert.  Psychiatric:        Mood and Affect: Mood is anxious.     EKG: NSR at 62. Larger t-waves in V2, V3, V4.       Assessment & Plan:  Sean Serrano was seen today for chest pain.  Diagnoses and all orders for this visit:  Benign essential hypertension -     EKG 12-Lead  Chest tightness -     EKG 12-Lead -     TSH + free T4 -     CBC w/Diff/Platelet -     CMP14+EGFR -     VITAMIN D  25 Hydroxy (Vit-D Deficiency, Fractures) -     Fe+TIBC+Fer -     LONG TERM MONITOR (3-14 DAYS); Future  Migraine with aura and without status migrainosus, not intractable -     TSH + free T4 -     CBC w/Diff/Platelet -     CMP14+EGFR -     rizatriptan  (MAXALT -MLT) 10 MG disintegrating tablet; Take 1 tablet (10 mg total) by mouth as needed for migraine. May repeat in 2 hours if needed -     VITAMIN D  25 Hydroxy (Vit-D Deficiency, Fractures) -     Fe+TIBC+Fer  Palpitations -     TSH + free T4 -     CBC w/Diff/Platelet -  CMP14+EGFR -     VITAMIN  D 25 Hydroxy (Vit-D Deficiency, Fractures) -     Fe+TIBC+Fer -     LONG TERM MONITOR (3-14 DAYS); Future  History of DVT (deep vein thrombosis)    Chest tightness  Palpitations - EKG shows no acute changes or any signs of ischemia or arrhythmia. He does have some larger lateral t-waves that I will look at electrolytes for.  -no red flag symptoms or exam findings today(CP improves with exertion)  - Ordered CBC, CMP, TSH/Free T4, Iron panel and Vit D labs today  - Ordered Zio-patch, discussed used for continuous heart monitor to record any rhythm changes that may occur during episode of chest tightness/palpitations and how the information recorded will be used   Migraine  - Start Maxalt  10mg  ODT today, sent into pharmacy  - Educated patient on the use of this medication, when to use is (at onset on aura symptoms)  - Encouraged patient to start magnesium for additional migraine prophylaxis   Stress - PHQ-9 score: 4 - GAD-7 score: 4  - Discussed potential use of anti-depressants or anti-anxiety medications (Lexapro), what symptoms might warrant a daily medication, recommended patient discuss with main provider if he feels this may be beneficial in the future.  - Encouraged patient to continue taking supplements (Ashwaghanda) and to start Magnesium to help with stress and sleep.  Hypertension  Hyperlipidemia  - BP slightly elevated today, likely secondary to stress -improved on 2nd recheck - Continue amlodipine , atorvastatin   - Continue monitoring BP at home   Hx of DVT/ PE On lifelong anti-coagulation  - Continue Xarelto  daily   GERD - Continue famotidine  daily  Educated patient on concerning symptoms for stroke or ACS, what symptoms are concerning (dizziness, vision changes, shortness of breath, chest pain worsening on exertion, pain or swelling in extremities, numbness/tingling in extremities, focal neurological deficits or changes).   Return in about 4 weeks (around  11/07/2023), or if symptoms worsen or fail to improve, for PCP.  Brissa Asante, PA-C

## 2023-10-10 NOTE — Progress Notes (Unsigned)
 EP to read.

## 2023-10-11 LAB — IRON,TIBC AND FERRITIN PANEL
Ferritin: 161 ng/mL (ref 30–400)
Iron Saturation: 28 % (ref 15–55)
Iron: 99 ug/dL (ref 38–169)
Total Iron Binding Capacity: 359 ug/dL (ref 250–450)
UIBC: 260 ug/dL (ref 111–343)

## 2023-10-11 LAB — TSH+FREE T4
Free T4: 1.08 ng/dL (ref 0.82–1.77)
TSH: 2.06 u[IU]/mL (ref 0.450–4.500)

## 2023-10-11 LAB — CMP14+EGFR
ALT: 23 IU/L (ref 0–44)
AST: 22 IU/L (ref 0–40)
Albumin: 4.6 g/dL (ref 3.8–4.9)
Alkaline Phosphatase: 50 IU/L (ref 44–121)
BUN/Creatinine Ratio: 15 (ref 9–20)
BUN: 14 mg/dL (ref 6–24)
Bilirubin Total: 0.5 mg/dL (ref 0.0–1.2)
CO2: 19 mmol/L — ABNORMAL LOW (ref 20–29)
Calcium: 9.3 mg/dL (ref 8.7–10.2)
Chloride: 105 mmol/L (ref 96–106)
Creatinine, Ser: 0.93 mg/dL (ref 0.76–1.27)
Globulin, Total: 2.3 g/dL (ref 1.5–4.5)
Glucose: 82 mg/dL (ref 70–99)
Potassium: 4.2 mmol/L (ref 3.5–5.2)
Sodium: 141 mmol/L (ref 134–144)
Total Protein: 6.9 g/dL (ref 6.0–8.5)
eGFR: 97 mL/min/1.73 (ref >59)

## 2023-10-11 LAB — CBC WITH DIFFERENTIAL/PLATELET
Basophils Absolute: 0 x10E3/uL (ref 0.0–0.2)
Basos: 1 %
EOS (ABSOLUTE): 0.1 x10E3/uL (ref 0.0–0.4)
Eos: 2 %
Hematocrit: 45.1 % (ref 37.5–51.0)
Hemoglobin: 15.3 g/dL (ref 13.0–17.7)
Immature Grans (Abs): 0 x10E3/uL (ref 0.0–0.1)
Immature Granulocytes: 0 %
Lymphocytes Absolute: 1.7 x10E3/uL (ref 0.7–3.1)
Lymphs: 31 %
MCH: 33.2 pg — ABNORMAL HIGH (ref 26.6–33.0)
MCHC: 33.9 g/dL (ref 31.5–35.7)
MCV: 98 fL — ABNORMAL HIGH (ref 79–97)
Monocytes Absolute: 0.5 x10E3/uL (ref 0.1–0.9)
Monocytes: 9 %
Neutrophils Absolute: 3.1 x10E3/uL (ref 1.4–7.0)
Neutrophils: 57 %
Platelets: 253 x10E3/uL (ref 150–450)
RBC: 4.61 x10E6/uL (ref 4.14–5.80)
RDW: 13.8 % (ref 11.6–15.4)
WBC: 5.5 x10E3/uL (ref 3.4–10.8)

## 2023-10-11 LAB — VITAMIN D 25 HYDROXY (VIT D DEFICIENCY, FRACTURES): Vit D, 25-Hydroxy: 61 ng/mL (ref 30.0–100.0)

## 2023-10-13 ENCOUNTER — Ambulatory Visit: Payer: Self-pay | Admitting: Physician Assistant

## 2023-10-13 NOTE — Progress Notes (Signed)
 Koren,   Iron levels look great.  Size of red blood cell a little elevated.I will check a b12 and folate and make sure they are in normal range(please add to these labs) Thyroid  normal range.  Kidney, liver, glucose look good.  Vitamin D  looks amazing.   Labs are reassuring.

## 2023-10-30 ENCOUNTER — Telehealth: Payer: Self-pay

## 2023-10-30 DIAGNOSIS — N529 Male erectile dysfunction, unspecified: Secondary | ICD-10-CM

## 2023-10-30 MED ORDER — TADALAFIL 5 MG PO TABS: 5.0000 mg | ORAL_TABLET | Freq: Every day | ORAL | 0 refills | Status: DC

## 2023-10-30 NOTE — Telephone Encounter (Signed)
 Last visit 12/26/2022

## 2023-10-30 NOTE — Telephone Encounter (Signed)
 Copied from CRM [id number]. Topic: Clinical - Medication Question >> Oct 30, 2023  1:16 PM Cherylann S wrote: Reason for CRM: Patient states that she out of refills on tadalafil  (CIALIS ) 5 MG tablet and would like to know if Dr. ONEIDA will send in another prescription to the pharmacy.  [facility] [address] [place] - [address] [place] [address] Phone: [phone] Fax: [phone] Hours: Not open 24 hours

## 2023-10-30 NOTE — Telephone Encounter (Signed)
 Done

## 2023-10-31 ENCOUNTER — Other Ambulatory Visit: Payer: Self-pay

## 2023-10-31 ENCOUNTER — Other Ambulatory Visit (HOSPITAL_COMMUNITY): Payer: Self-pay

## 2023-10-31 ENCOUNTER — Telehealth: Payer: Self-pay

## 2023-10-31 DIAGNOSIS — N529 Male erectile dysfunction, unspecified: Secondary | ICD-10-CM

## 2023-10-31 MED ORDER — TADALAFIL 5 MG PO TABS: 5.0000 mg | ORAL_TABLET | Freq: Every day | ORAL | 0 refills | Status: DC

## 2023-10-31 NOTE — Telephone Encounter (Signed)
 He can get it for cheap downloading a GoodRx coupon and then going to the pharmacy where the cost is least.  Please have him let me know where he would like me to send it.

## 2023-10-31 NOTE — Telephone Encounter (Signed)
 Patient informed.

## 2023-10-31 NOTE — Telephone Encounter (Signed)
 Pharmacy Patient Advocate Encounter  Received notification from [facility] that Prior Authorization for Tadalafil  5mg  tabs has been DENIED.  See denial reason below. No denial letter attached in CMM. Will attach denial letter to Media tab once received.   PA #/Case ID/Reference #: [id number]

## 2023-10-31 NOTE — Telephone Encounter (Signed)
 Pharmacy Patient Advocate Encounter   Received notification from CoverMyMeds that prior authorization for Tadalafil  5mg  tabs is required/requested.   Insurance verification completed.   The patient is insured through [facility] .   Per test claim: PA required; PA submitted to above mentioned insurance via CoverMyMeds Key/confirmation #/EOC [facility] Status is pending

## 2023-10-31 NOTE — Telephone Encounter (Signed)
 Copied from CRM [id number]. Topic: Clinical - Medication Question >> Oct 30, 2023  1:16 PM Cherylann S wrote: Reason for CRM: Patient states that she out of refills on tadalafil  (CIALIS ) 5 MG tablet and would like to know if Dr. ONEIDA will send in another prescription to the pharmacy.  [facility] [address] [place] - [address] [place] [address] Phone: [phone] Fax: [phone] Hours: Not open 24 hours

## 2023-11-03 NOTE — Telephone Encounter (Signed)
 Last read by Ozell DELENA Mathew Koren at 4:49PM on 10/31/2023.

## 2023-11-03 NOTE — Telephone Encounter (Signed)
 Attempted call to patient. Left a voice mail message requesting a return call.

## 2023-11-07 ENCOUNTER — Ambulatory Visit: Admitting: Sports Medicine

## 2023-11-07 VITALS — BP 118/73 | HR 63 | Ht 69.0 in | Wt 188.0 lb

## 2023-11-07 DIAGNOSIS — R002 Palpitations: Secondary | ICD-10-CM | POA: Diagnosis not present

## 2023-11-07 DIAGNOSIS — G43109 Migraine with aura, not intractable, without status migrainosus: Secondary | ICD-10-CM

## 2023-11-07 DIAGNOSIS — R519 Headache, unspecified: Secondary | ICD-10-CM

## 2023-11-07 NOTE — Progress Notes (Signed)
    Procedures performed today:    None.  Independent interpretation of notes and tests performed by another provider:   None.  Brief History, Exam, Impression, and Recommendations:    Migraine with aura and without status migrainosus, not intractable Increasing headaches, 3 over the last week, he does typically get a hemianopsia, some confusion not typically followed by headache consistent with an acephalgic migraine. We discussed the neuroanatomy of the hemianopsia associated with the acephalic migraine aura. He can use Tylenol  or Excedrin Migraine to abort this, but also sounds like he was prescribed an ergo alkaloid by another provider. As he is having a crescendo worsening of headaches I would like a brain MRI, rule out mass/demyelination. Follow-up can depend on brain MRI results.  Palpitations Preliminary Zio patch results are back, PACs, PVCs, run of SVT, nothing sustained. Still awaiting official results, he did have a normal echocardiogram 2023, I explained these symptoms could be blocked with beta-blocker however they are not bad enough to currently treat this, he was reassured.  I spent 30 minutes of total time managing this patient today, this includes chart review, face to face, and non-face to face time.  ____________________________________________ Debby PARAS. Curtis, M.D., ABFM., CAQSM., AME. Primary Care and Sports Medicine [facility]  Adjunct Professor of [facility] Medicine  [employer]  Restaurant manager, fast food

## 2023-11-07 NOTE — Assessment & Plan Note (Signed)
 Preliminary Zio patch results are back, PACs, PVCs, run of SVT, nothing sustained. Still awaiting official results, he did have a normal echocardiogram 2023, I explained these symptoms could be blocked with beta-blocker however they are not bad enough to currently treat this, he was reassured.

## 2023-11-07 NOTE — Assessment & Plan Note (Signed)
 Increasing headaches, 3 over the last week, he does typically get a hemianopsia, some confusion not typically followed by headache consistent with an acephalgic migraine. We discussed the neuroanatomy of the hemianopsia associated with the acephalic migraine aura. He can use Tylenol  or Excedrin Migraine to abort this, but also sounds like he was prescribed an ergo alkaloid by another provider. As he is having a crescendo worsening of headaches I would like a brain MRI, rule out mass/demyelination. Follow-up can depend on brain MRI results.

## 2023-11-09 DIAGNOSIS — R0789 Other chest pain: Secondary | ICD-10-CM

## 2023-11-09 DIAGNOSIS — R002 Palpitations: Secondary | ICD-10-CM

## 2023-11-10 NOTE — Progress Notes (Signed)
 Sean Serrano,   Heart monitor showed: episodes of increased heart rate and pre mature ventricular contractions but not very often but when you documented symptoms. You did have a few(6) runs of a really fast heart rate but very short duration. If symptoms persist could consider beta blocker to help control rate a little more.   PVC are benign findings unless the burden is really high but yours is not.  Do you have follow up with PCP?

## 2023-11-17 ENCOUNTER — Other Ambulatory Visit

## 2023-11-17 DIAGNOSIS — G43109 Migraine with aura, not intractable, without status migrainosus: Secondary | ICD-10-CM

## 2023-11-17 DIAGNOSIS — R519 Headache, unspecified: Secondary | ICD-10-CM

## 2023-11-17 MED ORDER — GADOBUTROL 1 MMOL/ML IV SOLN
9.0000 mL | Freq: Once | INTRAVENOUS | Status: AC | PRN
  Administered 2023-11-17: 9 mL via INTRAVENOUS

## 2023-11-24 ENCOUNTER — Ambulatory Visit: Admitting: Sports Medicine

## 2023-11-26 ENCOUNTER — Ambulatory Visit: Admitting: Physician Assistant

## 2023-11-26 ENCOUNTER — Encounter: Payer: Self-pay | Admitting: Physician Assistant

## 2023-11-26 VITALS — BP 132/76 | HR 72 | Ht 69.0 in | Wt 188.0 lb

## 2023-11-26 DIAGNOSIS — Z7901 Long term (current) use of anticoagulants: Secondary | ICD-10-CM

## 2023-11-26 DIAGNOSIS — Z23 Encounter for immunization: Secondary | ICD-10-CM

## 2023-11-26 DIAGNOSIS — I10 Essential (primary) hypertension: Secondary | ICD-10-CM | POA: Diagnosis not present

## 2023-11-26 DIAGNOSIS — G43109 Migraine with aura, not intractable, without status migrainosus: Secondary | ICD-10-CM | POA: Diagnosis not present

## 2023-11-26 NOTE — Progress Notes (Signed)
 Maternal alzheimers     Established Patient Office Visit  Subjective   Patient ID: Sean Serrano, male    DOB: [birth year]  Age: 53 y.o. MRN: [id number]  HPI Patient is a 53 year old male with hypertension and history of DVT requiring chronic anticoagulation who presents to the clinic to transfer care.  His PCP left the clinic and is hoping to establish care with myself.  Patient had an MRI of the brain done 11/17/2023 that has not been resulted and he would like to know the status of this order.  MRI was done due to his new onset of migraines.  He has not had a migraine since he was given Maxalt  for rescue.  He has not used the Maxalt  yet.  He is somewhat concerned about his decline in memory.  He just feels like he is not as sharp as he used to be.  His maternal grandmother did have Alzheimer's and that is worrisome to him.  Patient is compliant with all his medications.  Patient denies any chest pains or shortness of breath.  He occasionally continues to have some palpitations that are worse at night.   ROS See HPI.    Objective:     BP 132/76   Pulse 72   Ht 5' 9 (1.753 m)   Wt 188 lb (85.3 kg)   SpO2 99%   BMI 27.76 kg/m  BP Readings from Last 3 Encounters:  11/26/23 132/76  11/07/23 118/73  10/10/23 125/75   Wt Readings from Last 3 Encounters:  11/26/23 188 lb (85.3 kg)  11/07/23 188 lb (85.3 kg)  10/10/23 183 lb (83 kg)      Physical Exam Constitutional:      Appearance: Normal appearance.  HENT:     Head: Normocephalic.  Cardiovascular:     Rate and Rhythm: Normal rate and regular rhythm.  Pulmonary:     Effort: Pulmonary effort is normal.  Neurological:     Mental Status: He is alert and oriented to person, place, and time.  Psychiatric:        Mood and Affect: Mood normal.      The 10-year ASCVD risk score (Arnett DK, et al., 2019) is: 5.8%    Assessment & Plan:  SABRASABRAZayde Stroupe was seen today for establish care.  Diagnoses and all orders for  this visit:  Migraine with aura and without status migrainosus, not intractable  Benign essential hypertension  Need for Tdap vaccination -     Tdap vaccine greater than or equal to 7yo IM  Chronic anticoagulation   Discussed with patient that MRI has not been read yet and will have to discuss results once it has been resulted BP looks good today, continue amlodapine No migraines since July, maxalt  on hand if needed Tdap given today Needs CPE in October with repeat labs    Return for schedule CPE with me in October.    Terelle Dobler, PA-C

## 2023-12-02 ENCOUNTER — Encounter: Payer: Self-pay | Admitting: Sports Medicine

## 2023-12-03 ENCOUNTER — Other Ambulatory Visit: Payer: Self-pay | Admitting: Physician Assistant

## 2023-12-03 DIAGNOSIS — G43109 Migraine with aura, not intractable, without status migrainosus: Secondary | ICD-10-CM

## 2024-01-07 ENCOUNTER — Ambulatory Visit (INDEPENDENT_AMBULATORY_CARE_PROVIDER_SITE_OTHER): Admitting: Physician Assistant

## 2024-01-07 ENCOUNTER — Encounter: Payer: Self-pay | Admitting: Physician Assistant

## 2024-01-07 VITALS — BP 118/78 | HR 95 | Ht 69.0 in | Wt 190.0 lb

## 2024-01-07 DIAGNOSIS — Z0000 Encounter for general adult medical examination without abnormal findings: Secondary | ICD-10-CM | POA: Diagnosis not present

## 2024-01-07 DIAGNOSIS — I10 Essential (primary) hypertension: Secondary | ICD-10-CM

## 2024-01-07 DIAGNOSIS — Z7901 Long term (current) use of anticoagulants: Secondary | ICD-10-CM

## 2024-01-07 DIAGNOSIS — N529 Male erectile dysfunction, unspecified: Secondary | ICD-10-CM

## 2024-01-07 DIAGNOSIS — Z125 Encounter for screening for malignant neoplasm of prostate: Secondary | ICD-10-CM

## 2024-01-07 MED ORDER — AMLODIPINE BESYLATE 10 MG PO TABS: 10.0000 mg | ORAL_TABLET | Freq: Every day | ORAL | 4 refills | Status: AC

## 2024-01-07 MED ORDER — TADALAFIL 5 MG PO TABS: 5.0000 mg | ORAL_TABLET | Freq: Every day | ORAL | 4 refills | Status: AC

## 2024-01-07 MED ORDER — RIVAROXABAN 15 MG PO TABS: 15.0000 mg | ORAL_TABLET | Freq: Every day | ORAL | 4 refills | Status: AC

## 2024-01-07 NOTE — Progress Notes (Unsigned)
 Complete physical exam  Patient: Sean Serrano   DOB: 1970-08-06   53 y.o. Male  MRN: [id number]  Subjective:    Chief Complaint  Patient presents with   Annual Exam   Discussed the use of AI scribe software for clinical note transcription with the patient, who gave verbal consent to proceed.  History of Present Illness Sean Serrano is a 53 year old male who presents for his annual physical exam.  General health and physical activity - Weight is stable - Engages in physical activity, riding a mountain bike 11 miles twice a week  Cardiovascular and blood pressure status - Blood pressure today is 118/78 mmHg - No chest pain - No shortness of breath  Migraine headaches - Improvement in migraine frequency - Last migraine episode occurred three days in a row but none since - Uses Maxalt  as needed for migraine rescue - Takes magnesium and ashwagandha supplements  Bowel habits - Bowel movements have been taking longer over the past six months to a year  Lower urinary tract symptoms - Difficulty initiating urination - Difficulty emptying bladder, especially at night  Sleep quality - Sleep quality is better when avoiding alcohol - Consumption of three beers negatively affects sleep  Past infectious disease - History of shingles, completed shingles vaccine.   Medication use - Cialis  daily for erectile dysfunction - Atorvastatin  for hyperlipidemia - Xarelto  for chronic anticoagulation - Amlodipine  for hypertension    Most recent fall risk assessment:    12/11/2021    8:44 AM  Fall Risk   Falls in the past year? 0  Number falls in past yr: 0  Injury with Fall? 0  Risk for fall due to : No Fall Risks  Follow up Falls evaluation completed      Data saved with a previous flowsheet row definition     Most recent depression screenings:    10/10/2023   11:57 AM 07/19/2022   10:53 AM  PHQ 2/9 Scores  PHQ - 2 Score 1 0  PHQ- 9 Score 4      Vision:Within last year, Dental: No current dental problems and Receives regular dental care, and PSA: Agrees to PSA testing  Patient Active Problem List   Diagnosis Date Noted   Migraine with aura and without status migrainosus, not intractable 10/10/2023   Palpitations 10/10/2023   Chest tightness 10/10/2023   History of DVT (deep vein thrombosis) 10/10/2023   Benign essential hypertension 12/26/2022   Insomnia 12/11/2021   Primary hypercoagulable state 11/16/2021   Family history of pulmonary embolism 11/16/2021   Chronic anticoagulation 11/16/2021   Pulmonary embolism (HCC) 10/25/2021   Herniated nucleus pulposus, L4-5 10/19/2021   Lumbar herniated disc 10/19/2021   Cauda equina syndrome (HCC) 10/04/2021   Bilateral wrist pain 12/11/2020   Hearing loss due to cerumen impaction, left 12/11/2020   Acute deep vein thrombosis (DVT) of calf muscle vein of right lower extremity (HCC) 04/13/2018   Erectile dysfunction 12/03/2016   Hyperlipidemia LDL goal <160 12/03/2016   Annual physical exam 09/14/2013   Obstructive uropathy 09/14/2013   Past Medical History:  Diagnosis Date   ED (erectile dysfunction)    GERD (gastroesophageal reflux disease)    Hyperlipidemia    PONV (postoperative nausea and vomiting)    Pulmonary embolus (HCC)    Past Surgical History:  Procedure Laterality Date   ac separation     BACK SURGERY     IR ANGIOGRAM PULMONARY BILATERAL SELECTIVE  10/25/2021  IR ANGIOGRAM SELECTIVE EACH ADDITIONAL VESSEL  10/25/2021   IR ANGIOGRAM SELECTIVE EACH ADDITIONAL VESSEL  10/25/2021   IR THROMBECT PRIM MECH ADD (INCLU) MOD SED  10/25/2021   IR THROMBECT PRIM MECH INIT (INCLU) MOD SED  10/25/2021   IR US  GUIDE VASC ACCESS RIGHT  10/25/2021   LUMBAR LAMINECTOMY/DECOMPRESSION MICRODISCECTOMY Bilateral 10/19/2021   Procedure: Bilateral Lumbar four-five  Microdiscectomy;  Surgeon: Colon Shove, MD;  Location: [facility] OR;  Service: Neurosurgery;  Laterality: Bilateral;    SHOULDER SURGERY     SPINE SURGERY  10/18/2021   Family History  Problem Relation Age of Onset   Healthy Mother    Alzheimer's disease Maternal Grandmother    No Known Allergies    Patient Care Team: Taquana Bartley L, PA-C as PCP - General (Family Medicine) Bernie Guillermina BROCKS, MD (Inactive) as Attending Physician (Hematology)   Outpatient Medications Prior to Visit  Medication Sig   acetaminophen  (TYLENOL ) 650 MG CR tablet Take 1 tablet (650 mg total) by mouth in the morning and at bedtime.   amLODipine  (NORVASC ) 10 MG tablet Take 1 tablet (10 mg total) by mouth daily.   atorvastatin  (LIPITOR) 10 MG tablet TAKE 1 TABLET BY MOUTH EVERY DAY   Blood Pressure Monitoring (BLOOD PRESSURE CUFF) MISC Automatic BP cuff, please check twice a day after sitting relaxed for 15 minutes   famotidine  (PEPCID ) 10 MG tablet Take 10 mg by mouth daily as needed for heartburn.   Rivaroxaban  (XARELTO ) 15 MG TABS tablet Take 1 tablet (15 mg total) by mouth daily. NEEDS APPOINTMENT FOR FURTHER REFILLS.   rizatriptan  (MAXALT -MLT) 10 MG disintegrating tablet TAKE 1 TABLET BY MOUTH AS NEEDED FOR MIGRAINE. MAY REPEAT IN 2 HOURS IF NEEDED   tadalafil  (CIALIS ) 5 MG tablet Take 1 tablet (5 mg total) by mouth daily.   No facility-administered medications prior to visit.    Review of Systems  All other systems reviewed and are negative.         Objective:     BP 118/78   Pulse 95   Ht 5' 9 (1.753 m)   Wt 190 lb (86.2 kg)   SpO2 99%   BMI 28.06 kg/m  BP Readings from Last 3 Encounters:  01/07/24 118/78  11/26/23 132/76  11/07/23 118/73   Wt Readings from Last 3 Encounters:  01/07/24 190 lb (86.2 kg)  11/26/23 188 lb (85.3 kg)  11/07/23 188 lb (85.3 kg)      RO-AUA SYMPTOM     Row Name 01/08/24 1500         During the last Month   Sensation of Bladder not Empty Less than half the time     Urinate<2 hours after last Less than half the time     Mult. stop/start when voiding About  half the time     Difficult to postpone voiding Less than 1 time in 5     Weak urinary stream More than half the time     Push/strain to begin urination Not at all     Times per night up to urinate Almost Always       OTHER   Total Score 17         Physical Exam  BP 118/78   Pulse 95   Ht 5' 9 (1.753 m)   Wt 190 lb (86.2 kg)   SpO2 99%   BMI 28.06 kg/m   General Appearance:    Alert, cooperative, no distress, appears stated age  Head:  Normocephalic, without obvious abnormality, atraumatic  Eyes:    PERRL, conjunctiva/corneas clear, EOM's intact, fundi    benign, both eyes       Ears:    Normal TM's and external ear canals, both ears  Nose:   Nares normal, septum midline, mucosa normal, no drainage    or sinus tenderness  Throat:   Lips, mucosa, and tongue normal; teeth and gums normal  Neck:   Supple, symmetrical, trachea midline, no adenopathy;       thyroid :  No enlargement/tenderness/nodules; no carotid   bruit or JVD  Back:     Symmetric, no curvature, ROM normal, no CVA tenderness  Lungs:     Clear to auscultation bilaterally, respirations unlabored  Chest wall:    No tenderness or deformity  Heart:    Regular rate and rhythm, S1 and S2 normal, no murmur, rub   or gallop  Abdomen:     Soft, non-tender, bowel sounds active all four quadrants,    no masses, no organomegaly        Extremities:   Extremities normal, atraumatic, no cyanosis or edema  Pulses:   2+ and symmetric all extremities  Skin:   Skin color, texture, turgor normal, no rashes or lesions  Lymph nodes:   Cervical, supraclavicular, and axillary nodes normal  Neurologic:   CNII-XII intact. Normal strength, sensation and reflexes      throughout      Assessment & Plan:    Routine Health Maintenance and Physical Exam  Immunization History  Administered Date(s) Administered   PFIZER(Purple Top)SARS-COV-2 Vaccination 06/24/2019, 07/19/2019   Tdap 09/28/2013, 11/26/2023   Zoster  Recombinant(Shingrix ) 12/11/2020, 03/05/2021    Health Maintenance  Topic Date Due   COVID-19 Vaccine (3 - 2025-26 season) 01/23/2024 (Originally 12/01/2023)   Influenza Vaccine  06/29/2024 (Originally 10/31/2023)   Hepatitis B Vaccines 19-59 Average Risk (1 of 3 - 19+ 3-dose series) 11/25/2024 (Originally 07/08/1987)   Pneumococcal Vaccine: 50+ Years (1 of 1 - PCV) 01/06/2025 (Originally 07/08/2018)   Fecal DNA (Cologuard)  07/30/2025   DTaP/Tdap/Td (3 - Td or Tdap) 11/25/2033   Hepatitis C Screening  Completed   HIV Screening  Completed   Zoster Vaccines- Shingrix   Completed   HPV VACCINES  Aged Out   Meningococcal B Vaccine  Aged Out    Discussed health benefits of physical activity, and encouraged him to engage in regular exercise appropriate for his age and condition.  Nanine Grice was seen today for annual exam.  Diagnoses and all orders for this visit:  Annual physical exam -     Lipid panel -     Vitamin B12 -     PSA, total and free -     CMP14+EGFR  Benign essential hypertension -     amLODipine  (NORVASC ) 10 MG tablet; Take 1 tablet (10 mg total) by mouth daily. -     CMP14+EGFR  Chronic anticoagulation -     Rivaroxaban  (XARELTO ) 15 MG TABS tablet; Take 1 tablet (15 mg total) by mouth daily. -     CMP14+EGFR  Vasculogenic erectile dysfunction, unspecified vasculogenic erectile dysfunction type -     PSA, total and free -     tadalafil  (CIALIS ) 5 MG tablet; Take 1 tablet (5 mg total) by mouth daily. -     CMP14+EGFR  Prostate cancer screening -     PSA, total and free   Assessment and Plan Assessment & Plan Adult Wellness Visit 53 year old male in good health with  well-controlled blood pressure. Engages in regular physical activity. Reports no chest pain or shortness of breath. Bowel movements take longer than before. Up to date with dental visits and no issues with swallowing or eating. - Encouraged pneumonia vaccine as indicated for age. - shingles  UTD/declined flu vaccine - Cologuard UTD - Order cholesterol and PSA labs. - Encourage regular exercise and healthy lifestyle habits.  Benign prostatic hyperplasia with lower urinary tract symptoms and erectile dysfunction Reports difficulty initiating urination and emptying bladder, especially at night. Currently taking Cialis  daily, aiding in both erectile dysfunction and BPH symptoms. Discussed potential for prostate enlargement to cause urinary obstruction and importance of monitoring symptoms. - Check PSA levels. - Provide AUA symptom score questionnaire for tracking symptoms, high today. - Consider saw palmetto for mild prostate symptoms. - I would also consider adding flomax  for what seems to be more severe symptoms, pt declines today  Benign essential hypertension Hypertension is well-controlled with current medication regimen. Blood pressure today is 118/78 mmHg.  Hyperlipidemia On atorvastatin  for hyperlipidemia. Cholesterol levels were not checked in the last set of labs from July. - Order cholesterol labs.  Migraine with aura Reports improvement in migraine frequency, with no episodes since experiencing three consecutive days of migraines. Continues to take magnesium and ashwagandha supplements as previously discussed. - Continue current migraine management plan with magnesium and ashwagandha supplements.       Barbara Keng, PA-C

## 2024-01-07 NOTE — Patient Instructions (Addendum)
 Saw Palmetto for prostate  Health Maintenance, Male Adopting a healthy lifestyle and getting preventive care are important in promoting health and wellness. Ask your health care provider about: The right schedule for you to have regular tests and exams. Things you can do on your own to prevent diseases and keep yourself healthy. What should I know about diet, weight, and exercise? Eat a healthy diet  Eat a diet that includes plenty of vegetables, fruits, low-fat dairy products, and lean protein. Do not eat a lot of foods that are high in solid fats, added sugars, or sodium. Maintain a healthy weight Body mass index (BMI) is a measurement that can be used to identify possible weight problems. It estimates body fat based on height and weight. Your health care provider can help determine your BMI and help you achieve or maintain a healthy weight. Get regular exercise Get regular exercise. This is one of the most important things you can do for your health. Most adults should: Exercise for at least 150 minutes each week. The exercise should increase your heart rate and make you sweat (moderate-intensity exercise). Do strengthening exercises at least twice a week. This is in addition to the moderate-intensity exercise. Spend less time sitting. Even light physical activity can be beneficial. Watch cholesterol and blood lipids Have your blood tested for lipids and cholesterol at 55 years of age, then have this test every 5 years. You may need to have your cholesterol levels checked more often if: Your lipid or cholesterol levels are high. You are older than 56 years of age. You are at high risk for heart disease. What should I know about cancer screening? Many types of cancers can be detected early and may often be prevented. Depending on your health history and family history, you may need to have cancer screening at various ages. This may include screening for: Colorectal cancer. Prostate  cancer. Skin cancer. Lung cancer. What should I know about heart disease, diabetes, and high blood pressure? Blood pressure and heart disease High blood pressure causes heart disease and increases the risk of stroke. This is more likely to develop in people who have high blood pressure readings or are overweight. Talk with your health care provider about your target blood pressure readings. Have your blood pressure checked: Every 3-5 years if you are 56-67 years of age. Every year if you are 3 years old or older. If you are between the ages of 37 and 74 and are a current or former smoker, ask your health care provider if you should have a one-time screening for abdominal aortic aneurysm (AAA). Diabetes Have regular diabetes screenings. This checks your fasting blood sugar level. Have the screening done: Once every three years after age 55 if you are at a normal weight and have a low risk for diabetes. More often and at a younger age if you are overweight or have a high risk for diabetes. What should I know about preventing infection? Hepatitis B If you have a higher risk for hepatitis B, you should be screened for this virus. Talk with your health care provider to find out if you are at risk for hepatitis B infection. Hepatitis C Blood testing is recommended for: Everyone born from [birth year] through 1967. Anyone with known risk factors for hepatitis C. Sexually transmitted infections (STIs) You should be screened each year for STIs, including gonorrhea and chlamydia, if: You are sexually active and are younger than 55 years of age. You are older than 55  years of age and your health care provider tells you that you are at risk for this type of infection. Your sexual activity has changed since you were last screened, and you are at increased risk for chlamydia or gonorrhea. Ask your health care provider if you are at risk. Ask your health care provider about whether you are at high risk for HIV.  Your health care provider may recommend a prescription medicine to help prevent HIV infection. If you choose to take medicine to prevent HIV, you should first get tested for HIV. You should then be tested every 3 months for as long as you are taking the medicine. Follow these instructions at home: Alcohol use Do not drink alcohol if your health care provider tells you not to drink. If you drink alcohol: Limit how much you have to 0-2 drinks a day. Know how much alcohol is in your drink. In the [place] one drink equals one 12 oz bottle of beer (355 mL), one 5 oz glass of wine (148 mL), or one 1 oz glass of hard liquor (44 mL). Lifestyle Do not use any products that contain nicotine or tobacco. These products include cigarettes, chewing tobacco, and vaping devices, such as e-cigarettes. If you need help quitting, ask your health care provider. Do not use street drugs. Do not share needles. Ask your health care provider for help if you need support or information about quitting drugs. General instructions Schedule regular health, dental, and eye exams. Stay current with your vaccines. Tell your health care provider if: You often feel depressed. You have ever been abused or do not feel safe at home. Summary Adopting a healthy lifestyle and getting preventive care are important in promoting health and wellness. Follow your health care provider's instructions about healthy diet, exercising, and getting tested or screened for diseases. Follow your health care provider's instructions on monitoring your cholesterol and blood pressure. This information is not intended to replace advice given to you by your health care provider. Make sure you discuss any questions you have with your health care provider. Document Revised: 08/07/2020 Document Reviewed: 08/07/2020 Elsevier Patient Education  2024 [employer].

## 2024-01-08 LAB — CMP14+EGFR
ALT: 25 IU/L (ref 0–44)
AST: 18 IU/L (ref 0–40)
Albumin: 4.4 g/dL (ref 3.8–4.9)
Alkaline Phosphatase: 51 IU/L (ref 47–123)
BUN/Creatinine Ratio: 17 (ref 9–20)
BUN: 17 mg/dL (ref 6–24)
Bilirubin Total: 0.7 mg/dL (ref 0.0–1.2)
CO2: 21 mmol/L (ref 20–29)
Calcium: 9.5 mg/dL (ref 8.7–10.2)
Chloride: 101 mmol/L (ref 96–106)
Creatinine, Ser: 1.01 mg/dL (ref 0.76–1.27)
Globulin, Total: 2.6 g/dL (ref 1.5–4.5)
Glucose: 88 mg/dL (ref 70–99)
Potassium: 4.5 mmol/L (ref 3.5–5.2)
Sodium: 139 mmol/L (ref 134–144)
Total Protein: 7 g/dL (ref 6.0–8.5)
eGFR: 88 mL/min/1.73 (ref >59)

## 2024-01-08 LAB — LIPID PANEL
Chol/HDL Ratio: 2.6 ratio (ref 0.0–5.0)
Cholesterol, Total: 189 mg/dL (ref 100–199)
HDL: 73 mg/dL (ref >39)
LDL Chol Calc (NIH): 102 mg/dL — ABNORMAL HIGH (ref 0–99)
Triglycerides: 75 mg/dL (ref 0–149)
VLDL Cholesterol Cal: 14 mg/dL (ref 5–40)

## 2024-01-08 LAB — PSA, TOTAL AND FREE
PSA, Free Pct: 35.6 %
PSA, Free: 0.32 ng/mL
Prostate Specific Ag, Serum: 0.9 ng/mL (ref 0.0–4.0)

## 2024-01-08 LAB — VITAMIN B12: Vitamin B-12: 815 pg/mL (ref 232–1245)

## 2024-01-09 ENCOUNTER — Ambulatory Visit: Payer: Self-pay | Admitting: Physician Assistant

## 2024-01-09 ENCOUNTER — Encounter: Payer: Self-pay | Admitting: Physician Assistant

## 2024-01-09 DIAGNOSIS — E785 Hyperlipidemia, unspecified: Secondary | ICD-10-CM

## 2024-01-09 MED ORDER — ATORVASTATIN CALCIUM 10 MG PO TABS: 10.0000 mg | ORAL_TABLET | Freq: Every day | ORAL | 4 refills | Status: AC

## 2024-01-09 NOTE — Progress Notes (Signed)
 Sean Serrano,   LDL improved from 1 year ago. Refilled lipitor.  B12 looks great.  Prostate enzyme is low and stable.  Your AUA symptom report was high. Consider adding flomax  to see if could help with urinary symptoms.  Kidney, liver, glucose look good.

## 2025-01-07 ENCOUNTER — Encounter: Admitting: Physician Assistant
